# Patient Record
Sex: Female | Born: 1955 | Hispanic: Yes | Marital: Married | Smoking: Former smoker
Health system: Southern US, Community
[De-identification: ages and names within clinical notes are randomized; demographics above are authoritative.]

## PROBLEM LIST (undated history)

## (undated) DIAGNOSIS — N200 Calculus of kidney: Secondary | ICD-10-CM

## (undated) DIAGNOSIS — Z5189 Encounter for other specified aftercare: Secondary | ICD-10-CM

## (undated) DIAGNOSIS — Z8042 Family history of malignant neoplasm of prostate: Secondary | ICD-10-CM

## (undated) DIAGNOSIS — K649 Unspecified hemorrhoids: Secondary | ICD-10-CM

## (undated) DIAGNOSIS — Z9889 Other specified postprocedural states: Secondary | ICD-10-CM

## (undated) DIAGNOSIS — E785 Hyperlipidemia, unspecified: Secondary | ICD-10-CM

## (undated) DIAGNOSIS — G4700 Insomnia, unspecified: Secondary | ICD-10-CM

## (undated) DIAGNOSIS — K219 Gastro-esophageal reflux disease without esophagitis: Secondary | ICD-10-CM

## (undated) DIAGNOSIS — I10 Essential (primary) hypertension: Secondary | ICD-10-CM

## (undated) DIAGNOSIS — E559 Vitamin D deficiency, unspecified: Secondary | ICD-10-CM

## (undated) DIAGNOSIS — N63 Unspecified lump in unspecified breast: Secondary | ICD-10-CM

## (undated) DIAGNOSIS — G5600 Carpal tunnel syndrome, unspecified upper limb: Secondary | ICD-10-CM

## (undated) DIAGNOSIS — M810 Age-related osteoporosis without current pathological fracture: Secondary | ICD-10-CM

## (undated) DIAGNOSIS — C801 Malignant (primary) neoplasm, unspecified: Secondary | ICD-10-CM

## (undated) HISTORY — DX: Vitamin D deficiency, unspecified: E55.9

## (undated) HISTORY — DX: Family history of malignant neoplasm of prostate: Z80.42

## (undated) HISTORY — DX: Unspecified lump in unspecified breast: N63.0

## (undated) HISTORY — DX: Gastro-esophageal reflux disease without esophagitis: K21.9

## (undated) HISTORY — DX: Insomnia, unspecified: G47.00

## (undated) HISTORY — PX: BREAST EXCISIONAL BIOPSY: SUR124

## (undated) HISTORY — DX: Hyperlipidemia, unspecified: E78.5

## (undated) HISTORY — DX: Carpal tunnel syndrome, unspecified upper limb: G56.00

## (undated) HISTORY — DX: Age-related osteoporosis without current pathological fracture: M81.0

## (undated) HISTORY — DX: Essential (primary) hypertension: I10

## (undated) HISTORY — DX: Malignant (primary) neoplasm, unspecified: C80.1

## (undated) HISTORY — DX: Unspecified hemorrhoids: K64.9

## (undated) HISTORY — DX: Calculus of kidney: N20.0

## (undated) HISTORY — DX: Encounter for other specified aftercare: Z51.89

## (undated) HISTORY — PX: BIOPSY THYROID: PRO38

---

## 2008-04-22 HISTORY — PX: BREAST BIOPSY: SHX20

## 2013-04-22 HISTORY — PX: CHOLECYSTECTOMY: SHX55

## 2016-04-22 DIAGNOSIS — S62109A Fracture of unspecified carpal bone, unspecified wrist, initial encounter for closed fracture: Secondary | ICD-10-CM

## 2016-04-22 HISTORY — DX: Fracture of unspecified carpal bone, unspecified wrist, initial encounter for closed fracture: S62.109A

## 2017-04-22 HISTORY — PX: HEMORRHOIDECTOMY WITH HEMORRHOID BANDING: SHX5633

## 2018-05-18 DIAGNOSIS — I10 Essential (primary) hypertension: Secondary | ICD-10-CM | POA: Insufficient documentation

## 2018-06-10 LAB — HM MAMMOGRAPHY

## 2018-06-18 DIAGNOSIS — E559 Vitamin D deficiency, unspecified: Secondary | ICD-10-CM | POA: Insufficient documentation

## 2019-01-19 ENCOUNTER — Encounter: Payer: Self-pay | Admitting: Gastroenterology

## 2019-01-19 DIAGNOSIS — K5900 Constipation, unspecified: Secondary | ICD-10-CM | POA: Diagnosis not present

## 2019-01-19 DIAGNOSIS — E785 Hyperlipidemia, unspecified: Secondary | ICD-10-CM | POA: Diagnosis not present

## 2019-01-19 DIAGNOSIS — M545 Low back pain: Secondary | ICD-10-CM | POA: Diagnosis not present

## 2019-01-19 DIAGNOSIS — I10 Essential (primary) hypertension: Secondary | ICD-10-CM | POA: Diagnosis not present

## 2019-02-13 DIAGNOSIS — Z20828 Contact with and (suspected) exposure to other viral communicable diseases: Secondary | ICD-10-CM | POA: Diagnosis not present

## 2019-02-21 DIAGNOSIS — K449 Diaphragmatic hernia without obstruction or gangrene: Secondary | ICD-10-CM

## 2019-02-21 HISTORY — DX: Diaphragmatic hernia without obstruction or gangrene: K44.9

## 2019-02-22 ENCOUNTER — Ambulatory Visit (INDEPENDENT_AMBULATORY_CARE_PROVIDER_SITE_OTHER): Payer: BC Managed Care – PPO | Admitting: Gastroenterology

## 2019-02-22 ENCOUNTER — Other Ambulatory Visit: Payer: Self-pay

## 2019-02-22 ENCOUNTER — Encounter: Payer: Self-pay | Admitting: Gastroenterology

## 2019-02-22 VITALS — BP 122/84 | HR 66 | Temp 98.1°F | Ht 61.0 in | Wt 169.1 lb

## 2019-02-22 DIAGNOSIS — Z860100 Personal history of colon polyps, unspecified: Secondary | ICD-10-CM

## 2019-02-22 DIAGNOSIS — Z1159 Encounter for screening for other viral diseases: Secondary | ICD-10-CM

## 2019-02-22 DIAGNOSIS — K219 Gastro-esophageal reflux disease without esophagitis: Secondary | ICD-10-CM

## 2019-02-22 MED ORDER — NA SULFATE-K SULFATE-MG SULF 17.5-3.13-1.6 GM/177ML PO SOLN: 1.0000 | ORAL | 0 refills | Status: DC

## 2019-02-22 MED ORDER — OMEPRAZOLE 40 MG PO CPDR: 40.0000 mg | DELAYED_RELEASE_CAPSULE | Freq: Every day | ORAL | 11 refills | Status: DC

## 2019-02-22 NOTE — Patient Instructions (Signed)
You have been scheduled for an endoscopy and colonoscopy. Please follow the written instructions given to you at your visit today. Please pick up your prep supplies at the pharmacy within the next 1-3 days. If you use inhalers (even only as needed), please bring them with you on the day of your procedure.  We have sent the following medications to your pharmacy for you to pick up at your convenience:  Omeprazole 40 mg take daily 30 minutes before breakfast  Due to recent COVID-19 restrictions implemented by [employer] and state authorities and in an effort to keep both patients and staff as safe as possible, [facility] requires COVID-19 testing prior to any scheduled endoscopic procedure. The testing center is located at [address] [place], [place] [address] in the [facility]  suite.  Your appointment has been scheduled for 03-17-2019 at 9:30 am.   Please bring your insurance cards to this appointment. You will require your COVID screen 2 business days prior to your endoscopic procedure.  You are not required to quarantine after your screening.  You will only receive a phone call with the results if it is POSITIVE.  If you do not receive a call the day before your procedure you should begin your prep, if ordered, and you should report to the endo center for your procedure at your designated appointment arrival time ( one hour prior to the procedure time). There is no cost to you for the screening on the day of the swab.  [facility] will file with your insurance company for the testing.    You may receive an automated phone call prior to your procedure or have a message in your MyChart that you have an appointment for a BP/15 at the [facility], please disregard this message.  Your testing will be at the [place] location.   If you are leaving [facility] travel [place] on [address], turn left onto [facility], turn night onto [address] at the 1st stop light turn right, pass the [employer] on your right and proceed to [place] (white building).

## 2019-02-22 NOTE — Progress Notes (Signed)
HPI: This is a very pleasant Spanish-speaking only 63 year old woman who was referred by her primary care physician [facility] [place] for chronic GERD, history of polyps:  Chief complaint is history of polyps in her colon, chronic GERD  She brought with her her 96 colonoscopy report from [place].  The gastroenterologist documented removal of 2 quite good sized polyps.  One was a 20 mm pedunculated polyp in the transverse that was completely removed with snare cautery.  The other was a 30 mm semipedunculated polyp that was removed piecemeal from the transverse colon.  That second site was tattooed with [place] ink.  No pathology results were available.  She recalls being told that she needed a "colonoscopy every 3 years"  She also has chronic heartburn.  At least for 15 or 20 years.  This can be bad at night but also occurs throughout the day.  It is severe burning.  Severe acid taste in her mouth.  Tums barely helps.  She has tried omeprazole in the past and it helped but she was told that it was bad to be on this long-term.  She does not have dysphagia.  Her weight is increasing lately.  She has no overt GI bleeding  Colon cancer does not run in her family   Review of systems: Pertinent positive and negative review of systems were noted in the above HPI section. All other review negative.   Past Medical History:  Diagnosis Date  . Hemorrhoids   . HTN (hypertension)   . Vitamin D deficiency     Past Surgical History:  Procedure Laterality Date  . BREAST SURGERY  2010  . CHOLECYSTECTOMY  2015  . COLONOSCOPY  2016  . HEMORRHOIDECTOMY WITH HEMORRHOID BANDING  04/22/2017    Current Outpatient Medications  Medication Sig Dispense Refill  . magnesium 30 MG tablet Take 30 mg by mouth daily.    . Melatonin 3 MG TABS Take 3 mg by mouth as needed.    . metoprolol succinate (TOPROL-XL) 25 MG 24 hr tablet Take 25 mg by mouth daily.    . psyllium (METAMUCIL) 58.6 % powder Take 1 packet by  mouth daily.    . vitamin E 100 UNIT capsule Take by mouth daily.     No current facility-administered medications for this visit.     Allergies as of 02/22/2019  . (No Known Allergies)    Family History  Problem Relation Age of Onset  . Cancer Maternal Grandmother   . Cancer Maternal Aunt   . Hypertension Mother     Social History   Socioeconomic History  . Marital status: Married    Spouse name: Not on file  . Number of children: Not on file  . Years of education: Not on file  . Highest education level: Not on file  Occupational History  . Not on file  Social Needs  . Financial resource strain: Not on file  . Food insecurity    Worry: Not on file    Inability: Not on file  . Transportation needs    Medical: Not on file    Non-medical: Not on file  Tobacco Use  . Smoking status: Never Smoker  . Smokeless tobacco: Former Network engineer and Sexual Activity  . Alcohol use: Yes    Comment: occasional  . Drug use: Not Currently  . Sexual activity: Not Currently  Lifestyle  . Physical activity    Days per week: Not on file    Minutes per session:  Not on file  . Stress: Not on file  Relationships  . Social Herbalist on phone: Not on file    Gets together: Not on file    Attends religious service: Not on file    Active member of club or organization: Not on file    Attends meetings of clubs or organizations: Not on file    Relationship status: Not on file  . Intimate partner violence    Fear of current or ex partner: Not on file    Emotionally abused: Not on file    Physically abused: Not on file    Forced sexual activity: Not on file  Other Topics Concern  . Not on file  Social History Narrative  . Not on file     Physical Exam: BP 122/84 (BP Location: Left Arm, Patient Position: Sitting, Cuff Size: Normal)   Pulse 66   Temp 98.1 F (36.7 C) (Oral)   Ht 5\' 1"  (1.549 m)   Wt 169 lb 2 oz (76.7 kg)   BMI 31.96 kg/m  Constitutional:  generally well-appearing Psychiatric: alert and oriented x3 Eyes: extraocular movements intact Mouth: oral pharynx moist, no lesions Neck: supple no lymphadenopathy Cardiovascular: heart regular rate and rhythm Lungs: clear to auscultation bilaterally Abdomen: soft, nontender, nondistended, no obvious ascites, no peritoneal signs, normal bowel sounds Extremities: no lower extremity edema bilaterally Skin: no lesions on visible extremities   Assessment and plan: 63 y.o. female with personal history of colon polyps, chronic GERD  She had 2 good sized polyps removed from her transverse colon.  We will try to get records from the pathology report.  Either way she clearly needs repeat colonoscopy at this point as it has been almost 6 years since that colonoscopy.  She has chronic GERD without alarm symptoms.  She has never had endoscopic testing, screening for Barrett's and we will do that at the same time as her colonoscopy.  I am calling in omeprazole 40 mg pills 1 pill shortly before breakfast every morning for now.  Please see the "Patient Instructions" section for addition details about the plan.   Owens Loffler, MD [facility] 02/22/2019, 10:12 AM

## 2019-03-08 ENCOUNTER — Encounter: Payer: Self-pay | Admitting: Gastroenterology

## 2019-03-15 ENCOUNTER — Telehealth: Payer: Self-pay | Admitting: Gastroenterology

## 2019-03-15 NOTE — Telephone Encounter (Signed)
I reviewed a packet of information from [facility].  This was from September 2020 when she was being referred for colonoscopy care.  There is really no mention of previous colonoscopies, pathology reports anywhere in this packet.

## 2019-03-17 ENCOUNTER — Other Ambulatory Visit: Payer: Self-pay | Admitting: Gastroenterology

## 2019-03-17 ENCOUNTER — Ambulatory Visit: Payer: BC Managed Care – PPO

## 2019-03-17 DIAGNOSIS — Z1159 Encounter for screening for other viral diseases: Secondary | ICD-10-CM

## 2019-03-19 LAB — SARS CORONAVIRUS 2 (TAT 6-24 HRS): SARS Coronavirus 2: NEGATIVE

## 2019-03-22 ENCOUNTER — Other Ambulatory Visit: Payer: Self-pay

## 2019-03-22 ENCOUNTER — Encounter: Payer: Self-pay | Admitting: Gastroenterology

## 2019-03-22 ENCOUNTER — Ambulatory Visit: Payer: BC Managed Care – PPO | Admitting: Gastroenterology

## 2019-03-22 VITALS — BP 148/77 | HR 60 | Temp 98.1°F | Resp 11 | Ht 61.0 in | Wt 169.0 lb

## 2019-03-22 DIAGNOSIS — K219 Gastro-esophageal reflux disease without esophagitis: Secondary | ICD-10-CM

## 2019-03-22 DIAGNOSIS — Z1211 Encounter for screening for malignant neoplasm of colon: Secondary | ICD-10-CM | POA: Diagnosis not present

## 2019-03-22 DIAGNOSIS — Z860100 Personal history of colon polyps, unspecified: Secondary | ICD-10-CM

## 2019-03-22 DIAGNOSIS — D125 Benign neoplasm of sigmoid colon: Secondary | ICD-10-CM

## 2019-03-22 DIAGNOSIS — K449 Diaphragmatic hernia without obstruction or gangrene: Secondary | ICD-10-CM

## 2019-03-22 MED ORDER — SODIUM CHLORIDE 0.9 % IV SOLN: 500.0000 mL | Freq: Once | INTRAVENOUS | Status: DC

## 2019-03-22 NOTE — Progress Notes (Signed)
To PACU, VSS. Report to [email or web address] 

## 2019-03-22 NOTE — Progress Notes (Signed)
Called to room to assist during endoscopic procedure.  Patient ID and intended procedure confirmed with present staff. Received instructions for my participation in the procedure from the performing physician.  

## 2019-03-22 NOTE — Op Note (Signed)
[facility] Patient Name: Lauren Hodge Procedure Date: 03/22/2019 10:21 AM MRN: [id number] Endoscopist: Milus Banister , MD Age: 63 Referring MD:  Date of Birth: [birth year] Gender: Female Account #: [id number] Procedure:                Upper GI endoscopy Medicines:                Monitored Anesthesia Care Procedure:                Pre-Anesthesia Assessment:                           - Prior to the procedure, a History and Physical                            was performed, and patient medications and                            allergies were reviewed. The patient's tolerance of                            previous anesthesia was also reviewed. The risks                            and benefits of the procedure and the sedation                            options and risks were discussed with the patient.                            All questions were answered, and informed consent                            was obtained. Prior Anticoagulants: The patient has                            taken no previous anticoagulant or antiplatelet                            agents. ASA Grade Assessment: II - A patient with                            mild systemic disease. After reviewing the risks                            and benefits, the patient was deemed in                            satisfactory condition to undergo the procedure.                           After obtaining informed consent, the endoscope was                            passed under direct vision. Throughout the  procedure, the patient's blood pressure, pulse, and                            oxygen saturations were monitored continuously. The                            Endoscope was introduced through the mouth, and                            advanced to the second part of duodenum. The upper                            GI endoscopy was accomplished without difficulty.   The patient tolerated the procedure well. Scope In: Scope Out: Findings:                 A small hiatal hernia was present.                           The exam was otherwise without abnormality. Complications:            No immediate complications. Estimated blood loss:                            None. Estimated Blood Loss:     Estimated blood loss: none. Impression:               - Small hiatal hernia.                           - The examination was otherwise normal.                           - No specimens collected. Recommendation:           - Patient has a contact number available for                            emergencies. The signs and symptoms of potential                            delayed complications were discussed with the                            patient. Return to normal activities tomorrow.                            Written discharge instructions were provided to the                            patient.                           - Resume previous diet.                           - Continue present medications (take the omeprazole  40mg  pills started in the office before breakfast                            every morning). Milus Banister, MD 03/22/2019 10:51:07 AM This report has been signed electronically.

## 2019-03-22 NOTE — Op Note (Signed)
[facility] Patient Name: Lauren Hodge Procedure Date: 03/22/2019 10:21 AM MRN: [id number] Endoscopist: Milus Banister , MD Age: 63 Referring MD:  Date of Birth: 10-08-1955 Gender: Female Account #: [id number] Procedure:                Colonoscopy Indications:              High risk colon cancer surveillance: Personal                            history of colonic polyps; colonoscopy 2015 [place]                            [place] removed two polyps (80mm pedunculated in                            transverse, 85mm sessile in transverse removed                            piecemeal, site tattood; path requested, never                            receieved) Medicines:                Monitored Anesthesia Care Procedure:                Pre-Anesthesia Assessment:                           - Prior to the procedure, a History and Physical                            was performed, and patient medications and                            allergies were reviewed. The patient's tolerance of                            previous anesthesia was also reviewed. The risks                            and benefits of the procedure and the sedation                            options and risks were discussed with the patient.                            All questions were answered, and informed consent                            was obtained. Prior Anticoagulants: The patient has                            taken no previous anticoagulant or antiplatelet  agents. ASA Grade Assessment: II - A patient with                            mild systemic disease. After reviewing the risks                            and benefits, the patient was deemed in                            satisfactory condition to undergo the procedure.                           After obtaining informed consent, the colonoscope                            was passed under direct vision. Throughout the                 procedure, the patient's blood pressure, pulse, and                            oxygen saturations were monitored continuously. The                            Colonoscope was introduced through the anus and                            advanced to the the cecum, identified by                            appendiceal orifice and ileocecal valve. The                            colonoscopy was performed without difficulty. The                            patient tolerated the procedure well. The quality                            of the bowel preparation was good. The ileocecal                            valve, appendiceal orifice, and rectum were                            photographed. Scope In: 10:30:29 AM Scope Out: 10:41:20 AM Scope Withdrawal Time: 0 hours 8 minutes 19 seconds  Total Procedure Duration: 0 hours 10 minutes 51 seconds  Findings:                 The site of 2015 polypectomy [facility]) was clearly                            located with aid of previous submucosal tattoo                            (  appeared to be in the descending segment). There                            was no residual or recurrent polyp at the site.                           A 2 mm polyp was found in the sigmoid colon. The                            polyp was sessile. The polyp was removed with a                            cold snare. Resection and retrieval were complete.                           The exam was otherwise without abnormality on                            direct and retroflexion views. Complications:            No immediate complications. Estimated blood loss:                            None. Estimated Blood Loss:     Estimated blood loss: none. Impression:               - One 2 mm polyp in the sigmoid colon, removed with                            a cold snare. Resected and retrieved.                           - The site of 2015 polypectomy [facility]) was clearly                             located with aid of previous submucosal tattoo                            (appeared to be in the descending segment). There                            was no residual or recurrent polyp at the site.                           - The examination was otherwise normal on direct                            and retroflexion views. Recommendation:           - Patient has a contact number available for                            emergencies. The signs and symptoms of potential  delayed complications were discussed with the                            patient. Return to normal activities tomorrow.                            Written discharge instructions were provided to the                            patient.                           - Resume previous diet.                           - Continue present medications.                           - Await pathology results. Milus Banister, MD 03/22/2019 10:49:10 AM This report has been signed electronically.

## 2019-03-22 NOTE — Patient Instructions (Addendum)
USTED TUVO UN PROCEDIMIENTO ENDOSCPICO HOY EN EL [facility]:   Lea el informe del procedimiento que se le entreg para cualquier pregunta especfica sobre lo que se encontr durante su examen.  Si el informe del examen no responde a sus preguntas, por favor llame a su gastroenterlogo para aclararlo.  Si usted solicit que no se le den detalles de lo que se encontr en su procedimiento al acompaante que le va a cuidar, entonces el informe del procedimiento se ha incluido en un sobre sellado para que usted lo revise despus cuando le sea ms conveniente.   LO QUE PUEDE ESPERAR: Algunas sensaciones de hinchazn en el abdomen.  Puede tener ms gases de lo normal.  El caminar puede ayudarle a eliminar el aire que se le puso en el tracto gastrointestinal durante el procedimiento y reducir la hinchazn.  Si le hicieron una endoscopia inferior (como una colonoscopia o una sigmoidoscopia flexible), podra notar manchas de sangre en las heces fecales o en el papel higinico.  Si se someti a una preparacin intestinal para su procedimiento, es posible que no tenga una evacuacin intestinal normal durante algunos das.   Tenga en cuenta:  Es posible que note un poco de irritacin y congestin en la nariz o algn drenaje.  Esto es debido al oxgeno utilizado durante su procedimiento.  No hay que preocuparse y esto debe desaparecer ms o menos en un da.   SNTOMAS PARA REPORTAR INMEDIATAMENTE:  Despus de una endoscopia inferior (colonoscopia o sigmoidoscopia flexible):  Cantidades excesivas de sangre en las heces fecales  Sensibilidad significativa o empeoramiento de los dolores abdominales   Hinchazn aguda del abdomen que antes no tena   Fiebre de 100F o ms   Despus de la endoscopia superior (EGD)  Vmitos de sangre o material como caf molido   Dolor en el pecho o dolor debajo de los omplatos que antes no tena   Dolor o dificultad persistente para tragar  Falta de aire que antes no  tena   Fiebre de 100F o ms  Heces fecales negras y pegajosas   Para asuntos urgentes o de emergencia, puede comunicarse con un gastroenterlogo a cualquier hora llamando al [phone].  DIETA:  Recomendamos una comida pequea al principio, pero luego puede continuar con su dieta normal.  Tome muchos lquidos, pero debe evitar las bebidas alcohlicas durante 24 horas.    ACTIVIDAD:  Debe planear tomarse las cosas con calma por el resto del da y no debe CONDUCIR ni usar maquinaria pesada hasta maana (debido a los medicamentos de sedacin utilizados durante el examen).     SEGUIMIENTO: Nuestro personal llamar al nmero que aparece en su historial al siguiente da hbil de su procedimiento para ver cmo se siente y para responder cualquier pregunta o inquietud que pueda tener con respecto a la informacin que se le dio despus del procedimiento. Si no podemos contactarle, le dejaremos un mensaje.  Sin embargo, si se siente bien y no tiene ningn problema, no es necesario que nos devuelva la llamada.  Asumiremos que ha regresado a sus actividades diarias normales sin incidentes. Si se le tomaron algunas biopsias, le contactaremos por telfono o por carta en las prximas 3 semanas.  Si no ha sabido nada sobre las biopsias en el transcurso de 3 semanas, por favor llmenos al [phone].   FIRMAS/CONFIDENCIALIDAD: Usted y/o el acompaante que le cuide han firmado documentos que se ingresarn en su historial mdico electrnico.  Estas firmas atestiguan el hecho   de que la informacin anterior  Read all handouts gien to you by your reocvery room nurse.  Thank-you for choosing [place] for your healthcare needs today.  YOU HAD AN ENDOSCOPIC PROCEDURE TODAY AT [facility]:   Refer to the procedure report that was given to you for any specific questions about what was found during the examination.  If the procedure report does not answer your questions, please call your  gastroenterologist to clarify.  If you requested that your care partner not be given the details of your procedure findings, then the procedure report has been included in a sealed envelope for you to review at your convenience later.  YOU SHOULD EXPECT: Some feelings of bloating in the abdomen. Passage of more gas than usual.  Walking can help get rid of the air that was put into your GI tract during the procedure and reduce the bloating. If you had a lower endoscopy (such as a colonoscopy or flexible sigmoidoscopy) you may notice spotting of blood in your stool or on the toilet paper. If you underwent a bowel prep for your procedure, you may not have a normal bowel movement for a few days.  Please Note:  You might notice some irritation and congestion in your nose or some drainage.  This is from the oxygen used during your procedure.  There is no need for concern and it should clear up in a day or so.  SYMPTOMS TO REPORT IMMEDIATELY:   Following lower endoscopy (colonoscopy or flexible sigmoidoscopy):  Excessive amounts of blood in the stool  Significant tenderness or worsening of abdominal pains  Swelling of the abdomen that is new, acute  Fever of 100F or higher   Following upper endoscopy (EGD)  Vomiting of blood or coffee ground material  New chest pain or pain under the shoulder blades  Painful or persistently difficult swallowing  New shortness of breath  Fever of 100F or higher  Black, tarry-looking stools  For urgent or emergent issues, a gastroenterologist can be reached at any hour by calling [phone].   DIET:  We do recommend a small meal at first, but then you may proceed to your regular diet.  Drink plenty of fluids but you should avoid alcoholic beverages for 24 hours.  ACTIVITY:  You should plan to take it easy for the rest of today and you should NOT DRIVE or use heavy machinery until tomorrow (because of the sedation medicines used during the test).    FOLLOW  UP: Our staff will call the number listed on your records 48-72 hours following your procedure to check on you and address any questions or concerns that you may have regarding the information given to you following your procedure. If we do not reach you, we will leave a message.  We will attempt to reach you two times.  During this call, we will ask if you have developed any symptoms of COVID 19. If you develop any symptoms (ie: fever, flu-like symptoms, shortness of breath, cough etc.) before then, please call [phone].  If you test positive for Covid 19 in the 2 weeks post procedure, please call and report this information to [place].    If any biopsies were taken you will be contacted by phone or by letter within the next 1-3 weeks.  Please call us at [phone] if you have not heard about the biopsies in 3 weeks.    SIGNATURES/CONFIDENTIALITY: You and/or your care partner have signed paperwork which will  be entered into your electronic medical record.  These signatures attest to the fact that that the information above on your After Visit Summary has been reviewed and is understood.  Full responsibility of the confidentiality of this discharge information lies with you and/or your care-partner. 

## 2019-03-24 ENCOUNTER — Telehealth: Payer: Self-pay

## 2019-03-24 NOTE — Telephone Encounter (Signed)
  Follow up Call-  Call back number 03/22/2019  Post procedure Call Back phone  # [phone]  Permission to leave phone message Yes  Some recent data might be hidden     Patient questions:  Do you have a fever, pain , or abdominal swelling? No. Pain Score  0 *  Have you tolerated food without any problems? Yes.    Have you been able to return to your normal activities? Yes.    Do you have any questions about your discharge instructions: Diet   No. Medications  No. Follow up visit  No.  Do you have questions or concerns about your Care? No.  Actions: * If pain score is 4 or above: No action needed, pain <4.  1. Have you developed a fever since your procedure? No  2.   Have you had an respiratory symptoms (SOB or cough) since your procedure? No 3.   Have you tested positive for COVID 19 since your procedure No  4.   Have you had any family members/close contacts diagnosed with the COVID 19 since your procedure?  No  If yes to any of these questions please route to Joylene John, RN and Alphonsa Gin, RN.

## 2019-03-26 ENCOUNTER — Encounter: Payer: Self-pay | Admitting: Gastroenterology

## 2019-06-29 DIAGNOSIS — K219 Gastro-esophageal reflux disease without esophagitis: Secondary | ICD-10-CM | POA: Diagnosis not present

## 2019-06-29 DIAGNOSIS — E559 Vitamin D deficiency, unspecified: Secondary | ICD-10-CM | POA: Diagnosis not present

## 2019-06-29 DIAGNOSIS — I10 Essential (primary) hypertension: Secondary | ICD-10-CM | POA: Diagnosis not present

## 2019-06-29 LAB — HEPATIC FUNCTION PANEL
ALT: 23 (ref 7–35)
AST: 20 (ref 13–35)
Alkaline Phosphatase: 123 (ref 25–125)
Bilirubin, Total: 0.4

## 2019-06-29 LAB — COMPREHENSIVE METABOLIC PANEL
Albumin: 4.3 (ref 3.5–5.0)
Calcium: 9.2 (ref 8.7–10.7)
GFR calc Af Amer: 110
GFR calc non Af Amer: 96
Globulin: 3.1

## 2019-06-29 LAB — BASIC METABOLIC PANEL
BUN: 14 (ref 4–21)
CO2: 20 (ref 13–22)
Chloride: 103 (ref 99–108)
Creatinine: 0.7 (ref 0.5–1.1)
Glucose: 88
Potassium: 3.9 (ref 3.4–5.3)
Sodium: 143 (ref 137–147)

## 2019-06-29 LAB — LIPID PANEL
Cholesterol: 226 — AB (ref 0–200)
HDL: 61 (ref 35–70)
LDL Cholesterol: 143
Triglycerides: 126 (ref 40–160)

## 2019-06-29 LAB — MICROALBUMIN, URINE: Microalb, Ur: 14.2

## 2019-06-29 LAB — VITAMIN D 25 HYDROXY (VIT D DEFICIENCY, FRACTURES): Vit D, 25-Hydroxy: 21.8

## 2019-07-28 DIAGNOSIS — Z20828 Contact with and (suspected) exposure to other viral communicable diseases: Secondary | ICD-10-CM | POA: Diagnosis not present

## 2019-07-29 DIAGNOSIS — Z23 Encounter for immunization: Secondary | ICD-10-CM | POA: Diagnosis not present

## 2019-08-19 DIAGNOSIS — Z23 Encounter for immunization: Secondary | ICD-10-CM | POA: Diagnosis not present

## 2019-09-30 DIAGNOSIS — E785 Hyperlipidemia, unspecified: Secondary | ICD-10-CM | POA: Diagnosis not present

## 2019-09-30 DIAGNOSIS — R2231 Localized swelling, mass and lump, right upper limb: Secondary | ICD-10-CM | POA: Diagnosis not present

## 2019-09-30 DIAGNOSIS — E559 Vitamin D deficiency, unspecified: Secondary | ICD-10-CM | POA: Diagnosis not present

## 2019-09-30 DIAGNOSIS — I10 Essential (primary) hypertension: Secondary | ICD-10-CM | POA: Diagnosis not present

## 2019-09-30 LAB — LIPID PANEL
Cholesterol: 224 — AB (ref 0–200)
HDL: 53 (ref 35–70)
LDL Cholesterol: 148
Triglycerides: 127 (ref 40–160)

## 2019-09-30 LAB — COMPREHENSIVE METABOLIC PANEL
Albumin: 4.2 (ref 3.5–5.0)
Calcium: 9.1 (ref 8.7–10.7)
GFR calc Af Amer: 118
GFR calc non Af Amer: 102
Globulin: 3.3

## 2019-09-30 LAB — HEPATIC FUNCTION PANEL
ALT: 24 (ref 7–35)
AST: 20 (ref 13–35)
Alkaline Phosphatase: 127 — AB (ref 25–125)
Bilirubin, Total: 0.4

## 2019-09-30 LAB — BASIC METABOLIC PANEL
BUN: 11 (ref 4–21)
CO2: 24 — AB (ref 13–22)
Chloride: 103 (ref 99–108)
Creatinine: 0.5 (ref 0.5–1.1)
Glucose: 88
Potassium: 3.7 (ref 3.4–5.3)
Sodium: 143 (ref 137–147)

## 2019-09-30 LAB — VITAMIN D 25 HYDROXY (VIT D DEFICIENCY, FRACTURES): Vit D, 25-Hydroxy: 25.7

## 2019-10-06 DIAGNOSIS — E785 Hyperlipidemia, unspecified: Secondary | ICD-10-CM | POA: Insufficient documentation

## 2020-01-25 DIAGNOSIS — L811 Chloasma: Secondary | ICD-10-CM | POA: Diagnosis not present

## 2020-01-25 DIAGNOSIS — E785 Hyperlipidemia, unspecified: Secondary | ICD-10-CM | POA: Diagnosis not present

## 2020-01-25 DIAGNOSIS — G5603 Carpal tunnel syndrome, bilateral upper limbs: Secondary | ICD-10-CM | POA: Diagnosis not present

## 2020-01-25 DIAGNOSIS — I10 Essential (primary) hypertension: Secondary | ICD-10-CM | POA: Diagnosis not present

## 2020-01-25 LAB — COMPREHENSIVE METABOLIC PANEL
Albumin: 4.4 (ref 3.5–5.0)
Calcium: 9.4 (ref 8.7–10.7)
GFR calc Af Amer: 114
GFR calc non Af Amer: 99
Globulin: 3.4

## 2020-01-25 LAB — BASIC METABOLIC PANEL
BUN: 16 (ref 4–21)
CO2: 25 — AB (ref 13–22)
Chloride: 102 (ref 99–108)
Creatinine: 0.6 (ref 0.5–1.1)
Glucose: 102
Potassium: 4.7 (ref 3.4–5.3)
Sodium: 141 (ref 137–147)

## 2020-01-25 LAB — HEPATIC FUNCTION PANEL
ALT: 48 — AB (ref 7–35)
AST: 34 (ref 13–35)
Alkaline Phosphatase: 160 — AB (ref 25–125)
Bilirubin, Total: 0.4

## 2020-01-25 LAB — LIPID PANEL
Cholesterol: 217 — AB (ref 0–200)
HDL: 60 (ref 35–70)
LDL Cholesterol: 138
Triglycerides: 109 (ref 40–160)

## 2020-05-15 DIAGNOSIS — R509 Fever, unspecified: Secondary | ICD-10-CM | POA: Diagnosis not present

## 2020-05-15 DIAGNOSIS — Z760 Encounter for issue of repeat prescription: Secondary | ICD-10-CM | POA: Diagnosis not present

## 2020-05-15 DIAGNOSIS — Z03818 Encounter for observation for suspected exposure to other biological agents ruled out: Secondary | ICD-10-CM | POA: Diagnosis not present

## 2020-07-21 DIAGNOSIS — E119 Type 2 diabetes mellitus without complications: Secondary | ICD-10-CM | POA: Diagnosis not present

## 2020-07-26 ENCOUNTER — Ambulatory Visit: Payer: BC Managed Care – PPO | Admitting: Medical

## 2020-07-26 DIAGNOSIS — K219 Gastro-esophageal reflux disease without esophagitis: Secondary | ICD-10-CM | POA: Insufficient documentation

## 2020-07-27 DIAGNOSIS — L811 Chloasma: Secondary | ICD-10-CM | POA: Diagnosis not present

## 2020-07-27 DIAGNOSIS — L821 Other seborrheic keratosis: Secondary | ICD-10-CM | POA: Diagnosis not present

## 2020-07-27 DIAGNOSIS — D1801 Hemangioma of skin and subcutaneous tissue: Secondary | ICD-10-CM | POA: Diagnosis not present

## 2020-07-30 DIAGNOSIS — Z20822 Contact with and (suspected) exposure to covid-19: Secondary | ICD-10-CM | POA: Diagnosis not present

## 2020-08-17 ENCOUNTER — Encounter: Payer: Self-pay | Admitting: Physician Assistant

## 2020-08-20 DIAGNOSIS — E119 Type 2 diabetes mellitus without complications: Secondary | ICD-10-CM | POA: Diagnosis not present

## 2020-08-24 ENCOUNTER — Encounter: Payer: Self-pay | Admitting: Internal Medicine

## 2020-08-24 ENCOUNTER — Ambulatory Visit (INDEPENDENT_AMBULATORY_CARE_PROVIDER_SITE_OTHER): Payer: BC Managed Care – PPO | Admitting: Internal Medicine

## 2020-08-24 ENCOUNTER — Other Ambulatory Visit (HOSPITAL_BASED_OUTPATIENT_CLINIC_OR_DEPARTMENT_OTHER): Payer: Self-pay

## 2020-08-24 ENCOUNTER — Other Ambulatory Visit: Payer: Self-pay

## 2020-08-24 VITALS — BP 162/89 | HR 64 | Temp 98.1°F | Ht 61.0 in | Wt 164.4 lb

## 2020-08-24 DIAGNOSIS — Z1272 Encounter for screening for malignant neoplasm of vagina: Secondary | ICD-10-CM

## 2020-08-24 DIAGNOSIS — E785 Hyperlipidemia, unspecified: Secondary | ICD-10-CM | POA: Diagnosis not present

## 2020-08-24 DIAGNOSIS — E041 Nontoxic single thyroid nodule: Secondary | ICD-10-CM

## 2020-08-24 DIAGNOSIS — Z9889 Other specified postprocedural states: Secondary | ICD-10-CM | POA: Diagnosis not present

## 2020-08-24 DIAGNOSIS — I10 Essential (primary) hypertension: Secondary | ICD-10-CM

## 2020-08-24 DIAGNOSIS — E559 Vitamin D deficiency, unspecified: Secondary | ICD-10-CM

## 2020-08-24 DIAGNOSIS — N6459 Other signs and symptoms in breast: Secondary | ICD-10-CM

## 2020-08-24 DIAGNOSIS — Z09 Encounter for follow-up examination after completed treatment for conditions other than malignant neoplasm: Secondary | ICD-10-CM

## 2020-08-24 DIAGNOSIS — Z01419 Encounter for gynecological examination (general) (routine) without abnormal findings: Secondary | ICD-10-CM

## 2020-08-24 DIAGNOSIS — Z8049 Family history of malignant neoplasm of other genital organs: Secondary | ICD-10-CM

## 2020-08-24 LAB — LIPID PANEL
Cholesterol: 207 mg/dL — ABNORMAL HIGH (ref 0–200)
HDL: 55.9 mg/dL (ref >39.00)
LDL Cholesterol: 118 mg/dL — ABNORMAL HIGH (ref 0–99)
NonHDL: 151.07
Total CHOL/HDL Ratio: 4
Triglycerides: 163 mg/dL — ABNORMAL HIGH (ref 0.0–149.0)
VLDL: 32.6 mg/dL (ref 0.0–40.0)

## 2020-08-24 LAB — COMPREHENSIVE METABOLIC PANEL
ALT: 42 U/L — ABNORMAL HIGH (ref 0–35)
AST: 27 U/L (ref 0–37)
Albumin: 4.3 g/dL (ref 3.5–5.2)
Alkaline Phosphatase: 120 U/L — ABNORMAL HIGH (ref 39–117)
BUN: 14 mg/dL (ref 6–23)
CO2: 30 mEq/L (ref 19–32)
Calcium: 9.3 mg/dL (ref 8.4–10.5)
Chloride: 104 mEq/L (ref 96–112)
Creatinine, Ser: 0.56 mg/dL (ref 0.40–1.20)
GFR: 97.49 mL/min (ref >60.00)
Glucose, Bld: 101 mg/dL — ABNORMAL HIGH (ref 70–99)
Potassium: 4.4 mEq/L (ref 3.5–5.1)
Sodium: 141 mEq/L (ref 135–145)
Total Bilirubin: 0.6 mg/dL (ref 0.2–1.2)
Total Protein: 7.9 g/dL (ref 6.0–8.3)

## 2020-08-24 LAB — CBC WITH DIFFERENTIAL/PLATELET
Basophils Absolute: 0 10*3/uL (ref 0.0–0.1)
Basophils Relative: 0.5 % (ref 0.0–3.0)
Eosinophils Absolute: 0.2 10*3/uL (ref 0.0–0.7)
Eosinophils Relative: 3.1 % (ref 0.0–5.0)
HCT: 39.2 % (ref 36.0–46.0)
Hemoglobin: 13.1 g/dL (ref 12.0–15.0)
Lymphocytes Relative: 29.2 % (ref 12.0–46.0)
Lymphs Abs: 1.8 10*3/uL (ref 0.7–4.0)
MCHC: 33.3 g/dL (ref 30.0–36.0)
MCV: 88.5 fl (ref 78.0–100.0)
Monocytes Absolute: 0.4 10*3/uL (ref 0.1–1.0)
Monocytes Relative: 7 % (ref 3.0–12.0)
Neutro Abs: 3.7 10*3/uL (ref 1.4–7.7)
Neutrophils Relative %: 60.2 % (ref 43.0–77.0)
Platelets: 226 10*3/uL (ref 150.0–400.0)
RBC: 4.43 Mil/uL (ref 3.87–5.11)
RDW: 13.8 % (ref 11.5–15.5)
WBC: 6.2 10*3/uL (ref 4.0–10.5)

## 2020-08-24 LAB — TSH: TSH: 1.35 u[IU]/mL (ref 0.35–4.50)

## 2020-08-24 LAB — VITAMIN D 25 HYDROXY (VIT D DEFICIENCY, FRACTURES): VITD: 29.82 ng/mL — ABNORMAL LOW (ref 30.00–100.00)

## 2020-08-24 MED ORDER — AMLODIPINE BESYLATE 5 MG PO TABS
5.0000 mg | ORAL_TABLET | Freq: Every day | ORAL | 3 refills | Status: DC
  Filled 2020-08-24: qty 30, 30d supply, fill #0

## 2020-08-24 NOTE — Patient Instructions (Signed)
siga las mismas medicinas  ademas amlodipina  5 mg una al dia para la presion  chequee la presion (110/65- 135/85)  Thyroid ultrasound: vaya al primer piso  mamograma y [place]: la llamaran  regrese en 2 meses  saque una cita en dos meses   Check the  blood pressure   BP GOAL is between 110/65 and  135/85. If it is consistently higher or lower, let me know   GO TO THE LAB : Get the blood work     [place], Viola Come back for physical exam in 2 months

## 2020-08-24 NOTE — Progress Notes (Signed)
Subjective:    Patient ID: Lauren Hodge, female    DOB: [birth year], 65 y.o.   MRN: [id number]  DOS:  08/24/2020 Type of visit - description: Here to get established  History of hypertension, currently on metoprolol, at home BPs remain elevated History of high cholesterol: Currently diet controlled History of 2 previous breast biopsies, the patient is quite concerned about it and request a mammogram with diagnostic ultrasound Today I found R thyroid nodule, patient was unaware. History of vitamin D deficiency, on OTCs.  Brought almost 100 pieces of records for my review.  The most relevant ones will be scanned 06/12/2018: Vitamin D 12.8, low Vitamin S01 and folic acid --- normal  01/25/2020: Total cholesterol 217, LDL 138 Creatinine 0.5, potassium 4.7, AST 34, ALT 48 is slightly high  09/30/2019: Total cholesterol 224, triglyceride 127, LDL 148 Alkaline phosphatase 127 slightly elevated.  AST ALT normal  Vitamin D 25, low  Review of Systems Has no major or acute concerns today.  Past Medical History:  Diagnosis Date  . Carpal tunnel syndrome   . Fracture of carpal bone 2018   bilateral  . GERD (gastroesophageal reflux disease)   . Hemorrhoids   . Hiatal hernia 02/2019   seen on EGD   . HTN (hypertension)   . Hyperlipidemia   . Insomnia   . Osteoporosis   . Vitamin D deficiency     Past Surgical History:  Procedure Laterality Date  . BREAST BIOPSY  2010   x2, 2010-2012 (no cancer)  . CHOLECYSTECTOMY  2015  . HEMORRHOIDECTOMY WITH HEMORRHOID BANDING  04/22/2017   Social History   Socioeconomic History  . Marital status: Married    Spouse name: Not on file  . Number of children: 2  . Years of education: Not on file  . Highest education level: Not on file  Occupational History  . Occupation: retired   Tobacco Use  . Smoking status: Former Smoker    Types: Cigarettes    Quit date: 08/25/2018    Years since quitting: 2.0  . Smokeless tobacco: Never Used   . Tobacco comment: used to smoke rarely   Substance and Sexual Activity  . Alcohol use: Not Currently    Comment: occasional  . Drug use: Not Currently  . Sexual activity: Not Currently  Other Topics Concern  . Not on file  Social History Narrative   Moved from [place] 2019   Social Determinants of Health   Financial Resource Strain: Not on file  Food Insecurity: Not on file  Transportation Needs: Not on file  Physical Activity: Not on file  Stress: Not on file  Social Connections: Not on file  Intimate Partner Violence: Not on file   Family History  Problem Relation Age of Onset  . Cancer Maternal Grandmother        vaginal  . Cancer Maternal Aunt        tongue  . Hypertension Mother   . CAD Neg Hx   . Diabetes Neg Hx   . Colon cancer Neg Hx   . Breast cancer Neg Hx     Allergies as of 08/24/2020   No Known Allergies     Medication List       Accurate as of Aug 24, 2020 11:59 PM. If you have any questions, ask your nurse or doctor.        STOP taking these medications   magnesium 30 MG tablet Stopped by: Kathlene November, MD   omeprazole  40 MG capsule Commonly known as: PRILOSEC Stopped by: Kathlene November, MD   psyllium 58.6 % powder Commonly known as: METAMUCIL Stopped by: Kathlene November, MD   vitamin E 45 MG (100 UNITS) capsule Stopped by: Kathlene November, MD     TAKE these medications   amLODipine 5 MG tablet Commonly known as: NORVASC Take 1 tablet (5 mg total) by mouth daily. Started by: Kathlene November, MD   melatonin 3 MG Tabs tablet Take 3 mg by mouth as needed.   metoprolol succinate 25 MG 24 hr tablet Commonly known as: TOPROL-XL Take 25 mg by mouth daily.   multivitamin-iron-minerals-folic acid chewable tablet Chew 1 tablet by mouth daily.   VITAMIN D3 PO Take by mouth.          Objective:   Physical Exam BP (!) 162/89 (BP Location: Left Arm, Patient Position: Sitting, Cuff Size: Large)   Pulse 64   Temp 98.1 F (36.7 C) (Temporal)   Ht 5\' 1"  (1.549 m)    Wt 164 lb 6.4 oz (74.6 kg)   SpO2 95%   BMI 31.06 kg/m  General:   Well developed, NAD, BMI noted.  HEENT:  Normocephalic . Face symmetric, atraumatic Neck: Right side of the thyroid gland is enlarged, not tender. Lungs:  CTA B Normal respiratory effort, no intercostal retractions, no accessory muscle use. Heart: RRR,  no murmur.  Abdomen:  Not distended, soft, non-tender. No rebound or rigidity.   Skin: Not pale. Not jaundice Lower extremities: no pretibial edema bilaterally  Neurologic:  alert & oriented X3.  Speech normal, gait appropriate for age and unassisted Psych--  Cognition and judgment appear intact.  Cooperative with normal attention span and concentration.  Behavior appropriate. No anxious or depressed appearing.     Assessment      Assessment, new patient 08/2020 HTN Hyperlipidemia GERD Vitamin D deficiency Osteoporosis? Colon polyps CTS  PLAN New patient HTN: Dx ~  4 years ago, on metoprolol, heart rate 64, BP here is elevated, at home is often 160s.  Plan: CMP, CBC, TSH, add amlodipine 5.  Reassess in 2 months Hyperlipidemia: Diet controlled, check FLP Thyroid nodule, R: Patient unaware, check ultrasound Vitamin D deficiency  On OTC supplements, check vitamin D Preventive care: History of 2 breast bx, request mammogram with ultrasound, will do. Requesting a gyn  referral, will do RTC CPX 2 months  This visit occurred during the SARS-CoV-2 public health emergency.  Safety protocols were in place, including screening questions prior to the visit, additional usage of staff PPE, and extensive cleaning of exam room while observing appropriate contact time as indicated for disinfecting solutions.

## 2020-08-26 DIAGNOSIS — Z09 Encounter for follow-up examination after completed treatment for conditions other than malignant neoplasm: Secondary | ICD-10-CM | POA: Insufficient documentation

## 2020-08-26 NOTE — Assessment & Plan Note (Signed)
New patient HTN: Dx ~  4 years ago, on metoprolol, heart rate 64, BP here is elevated, at home is often 160s.  Plan: CMP, CBC, TSH, add amlodipine 5.  Reassess in 2 months Hyperlipidemia: Diet controlled, check FLP Thyroid nodule, R: Patient unaware, check ultrasound Vitamin D deficiency  On OTC supplements, check vitamin D Preventive care: History of 2 breast bx, request mammogram with ultrasound, will do. Requesting a gyn  referral, will do RTC CPX 2 months

## 2020-08-29 ENCOUNTER — Telehealth: Payer: Self-pay | Admitting: Internal Medicine

## 2020-08-29 ENCOUNTER — Other Ambulatory Visit: Payer: Self-pay

## 2020-08-29 ENCOUNTER — Other Ambulatory Visit (HOSPITAL_BASED_OUTPATIENT_CLINIC_OR_DEPARTMENT_OTHER): Payer: Self-pay

## 2020-08-29 DIAGNOSIS — R7989 Other specified abnormal findings of blood chemistry: Secondary | ICD-10-CM

## 2020-08-29 MED ORDER — VITAMIN D (ERGOCALCIFEROL) 1.25 MG (50000 UNIT) PO CAPS
50000.0000 [IU] | ORAL_CAPSULE | ORAL | 0 refills | Status: DC
  Filled 2020-08-29: qty 12, 84d supply, fill #0

## 2020-08-29 NOTE — Telephone Encounter (Signed)
   Lauren Hodge DOB: Sep 03, 1955 MRN: [id number]   RIDER WAIVER AND RELEASE OF LIABILITY  For purposes of improving physical access to our facilities, Lauren Hodge is pleased to partner with third parties to provide [facility] patients or other authorized individuals the option of convenient, on-demand ground transportation services (the [place]") through use of the technology service that enables users to request on-demand ground transportation from independent third-party providers.  By opting to use and accept these [employer], I, the undersigned, hereby agree on behalf of myself, and on behalf of any minor child using the [employer] for whom I am the parent or legal guardian, as follows:  1. Government social research officer provided to me are provided by independent third-party transportation providers who are not [employer] or employees and who are unaffiliated with [employer]. 2. [employer] is neither a transportation carrier nor a common or public carrier. 3. [facility] has no control over the quality or safety of the transportation that occurs as a result of the [employer]. 4. [facility] cannot guarantee that any third-party transportation provider will complete any arranged transportation service. 5. [facility] makes no representation, warranty, or guarantee regarding the reliability, timeliness, quality, safety, suitability, or availability of any of the Transport Services or that they will be error free. 6. I fully understand that traveling by vehicle involves risks and dangers of serious bodily injury, including permanent disability, paralysis, and death. I agree, on behalf of myself and on behalf of any minor child using the Transport Services for whom I am the parent or legal guardian, that the entire risk arising out of my use of the [employer] remains solely with me, to the maximum extent permitted under applicable law. 7. The [employer] are provided "as is" and "as available." [facility] disclaims all representations and warranties, express, implied or statutory, not expressly set out in these terms, including the implied warranties of merchantability and fitness for a particular purpose. 8. I hereby waive and release [facility], its agents, employees, officers, directors, representatives, insurers, attorneys, assigns, successors, subsidiaries, and affiliates from any and all past, present, or future claims, demands, liabilities, actions, causes of action, or suits of any kind directly or indirectly arising from acceptance and use of the [employer]. 9. I further waive and release [facility] and its affiliates from all present and future liability and responsibility for any injury or death to persons or damages to property caused by or related to the use of the [employer]. 10. I have read this Waiver and Release of Liability, and I understand the terms used in it and their legal significance. This Waiver is freely and voluntarily given with the understanding that my right (as well as the right of any minor child for whom I am the parent or legal guardian using the [employer]) to legal recourse against  in connection with the [employer] is knowingly surrendered in return for use of these services.   I attest that I read the consent document to [name], gave Ms. Khurana the opportunity to ask questions and answered the questions asked (if any). I affirm that [name] then provided consent for she's participation in this program.     Legrand Pitts, read to patient in [place]

## 2020-08-30 ENCOUNTER — Other Ambulatory Visit: Payer: Self-pay | Admitting: Family Medicine

## 2020-08-30 ENCOUNTER — Ambulatory Visit (HOSPITAL_BASED_OUTPATIENT_CLINIC_OR_DEPARTMENT_OTHER)
Admission: RE | Admit: 2020-08-30 | Discharge: 2020-08-30 | Disposition: A | Discharge status: Home / Self Care | Payer: BC Managed Care – PPO | Source: Ambulatory Visit | Attending: Internal Medicine | Admitting: Internal Medicine

## 2020-08-30 ENCOUNTER — Other Ambulatory Visit: Payer: Self-pay

## 2020-08-30 ENCOUNTER — Encounter: Payer: Self-pay | Admitting: Family Medicine

## 2020-08-30 DIAGNOSIS — E041 Nontoxic single thyroid nodule: Secondary | ICD-10-CM | POA: Insufficient documentation

## 2020-08-30 NOTE — Progress Notes (Signed)
Received her thyroid US as below- message to pt  US THYROID  Result Date: 08/30/2020 CLINICAL DATA:  Right thyroid nodule EXAM: THYROID ULTRASOUND TECHNIQUE: Ultrasound examination of the thyroid gland and adjacent soft tissues was performed. COMPARISON:  None. FINDINGS: Parenchymal Echotexture: Normal Isthmus: 0.5 cm Right lobe: 5.0 x 2.0 x 2.2 cm Left lobe: 5.5 x 1.3 x 1.9 cm _________________________________________________________ Estimated total number of nodules >/= 1 cm: 2 Number of spongiform nodules >/=  2 cm not described below (TR1): 0 Number of mixed cystic and solid nodules >/= 1.5 cm not described below (TR2): 0 _________________________________________________________ Nodule # 1: Location: Right; inferior Maximum size: 1.3 cm; Other 2 dimensions: 1.2 x 1.2 cm Composition: solid/almost completely solid (2) Echogenicity: isoechoic (1) Shape: not taller-than-wide (0) Margins: ill-defined (0) Echogenic foci: none (0) ACR TI-RADS total points: 3. ACR TI-RADS risk category: TR3 (3 points). ACR TI-RADS recommendations: Given size (<1.4 cm) and appearance, this nodule does NOT meet TI-RADS criteria for biopsy or dedicated follow-up. _________________________________________________________ Nodule # 2: Location: Right; inferior Maximum size: 0.7 cm; Other 2 dimensions: 0.7 x 0.7 cm Composition: solid/almost completely solid (2) Echogenicity: hypoechoic (2) Shape: not taller-than-wide (0) Margins: ill-defined (0) Echogenic foci: none (0) ACR TI-RADS total points: 4. ACR TI-RADS risk category: TR4 (4-6 points). ACR TI-RADS recommendations: Given size (<0.9 cm) and appearance, this nodule does NOT meet TI-RADS criteria for biopsy or dedicated follow-up. _________________________________________________________ Nodule # 3: Location: Left; inferior Maximum size: 1.0 cm; Other 2 dimensions: 0.9 cm Composition: solid/almost completely solid (2) Echogenicity: hypoechoic (2) Shape: taller-than-wide (3) Margins:  smooth (0) Echogenic foci: none (0) ACR TI-RADS total points: 7. ACR TI-RADS risk category: TR5 (>/= 7 points). ACR TI-RADS recommendations: **Given size (>/= 1.0 cm) and appearance, fine needle aspiration of this highly suspicious nodule should be considered based on TI-RADS criteria. _________________________________________________________ IMPRESSION: Nodule 3 (TI-RADS 5) meets criteria for FNA. The above is in keeping with the ACR TI-RADS recommendations - J Am Coll Radiol 2017;14:587-595. Electronically Signed   By: Miachel Roux M.D.   On: 08/30/2020 10:11

## 2020-09-04 ENCOUNTER — Ambulatory Visit (HOSPITAL_BASED_OUTPATIENT_CLINIC_OR_DEPARTMENT_OTHER)
Admission: RE | Admit: 2020-09-04 | Discharge: 2020-09-04 | Disposition: A | Discharge status: Home / Self Care | Payer: BC Managed Care – PPO | Source: Ambulatory Visit | Attending: Internal Medicine | Admitting: Internal Medicine

## 2020-09-04 ENCOUNTER — Telehealth: Payer: Self-pay | Admitting: Internal Medicine

## 2020-09-04 ENCOUNTER — Other Ambulatory Visit (HOSPITAL_BASED_OUTPATIENT_CLINIC_OR_DEPARTMENT_OTHER): Payer: Self-pay

## 2020-09-04 ENCOUNTER — Other Ambulatory Visit: Payer: Self-pay

## 2020-09-04 DIAGNOSIS — R7989 Other specified abnormal findings of blood chemistry: Secondary | ICD-10-CM | POA: Diagnosis not present

## 2020-09-04 MED ORDER — METOPROLOL SUCCINATE ER 25 MG PO TB24
25.0000 mg | ORAL_TABLET | Freq: Every day | ORAL | 1 refills | Status: DC
  Filled 2020-09-04: qty 90, 90d supply, fill #0
  Filled 2020-12-04: qty 90, 90d supply, fill #1

## 2020-09-04 NOTE — Telephone Encounter (Signed)
Rx sent 

## 2020-09-04 NOTE — Telephone Encounter (Signed)
Pt called stating is completely out of her meds for metoprolol succinate (TOPROL-XL) 25 MG 24 hr tablet and is needing meds to be refill ASAP, please send rx to [facility]. Please advise.

## 2020-09-06 ENCOUNTER — Other Ambulatory Visit (HOSPITAL_COMMUNITY)
Admission: RE | Admit: 2020-09-06 | Discharge: 2020-09-06 | Disposition: A | Discharge status: Home / Self Care | Payer: BC Managed Care – PPO | Source: Ambulatory Visit | Attending: Family Medicine | Admitting: Family Medicine

## 2020-09-06 ENCOUNTER — Ambulatory Visit
Admission: RE | Admit: 2020-09-06 | Discharge: 2020-09-06 | Disposition: A | Discharge status: Home / Self Care | Payer: BC Managed Care – PPO | Source: Ambulatory Visit | Attending: Family Medicine | Admitting: Family Medicine

## 2020-09-06 DIAGNOSIS — E041 Nontoxic single thyroid nodule: Secondary | ICD-10-CM | POA: Insufficient documentation

## 2020-09-07 LAB — CYTOLOGY - NON PAP

## 2020-09-08 ENCOUNTER — Other Ambulatory Visit: Payer: Self-pay | Admitting: Family Medicine

## 2020-09-08 ENCOUNTER — Encounter: Payer: Self-pay | Admitting: Family Medicine

## 2020-09-08 DIAGNOSIS — C73 Malignant neoplasm of thyroid gland: Secondary | ICD-10-CM

## 2020-09-09 DIAGNOSIS — E041 Nontoxic single thyroid nodule: Secondary | ICD-10-CM | POA: Diagnosis not present

## 2020-09-12 ENCOUNTER — Encounter: Payer: Self-pay | Admitting: Family Medicine

## 2020-09-13 ENCOUNTER — Telehealth: Payer: Self-pay | Admitting: Internal Medicine

## 2020-09-13 NOTE — Telephone Encounter (Signed)
I spoke with the patient, we went over her ultrasound and biopsy results. 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 She already has a appointment to see endocrinology in few days. She recently lost somebody to cancer and she was very distressed. She is counseled, most likely her long-term prognosis will be very good. At the end of the conversation she felt better emotionally. She knows to reach out to me if needed and will keep the appointment with me for 10/30/2020

## 2020-09-19 ENCOUNTER — Other Ambulatory Visit: Payer: Self-pay

## 2020-09-19 ENCOUNTER — Ambulatory Visit (INDEPENDENT_AMBULATORY_CARE_PROVIDER_SITE_OTHER): Payer: BC Managed Care – PPO | Admitting: Internal Medicine

## 2020-09-19 ENCOUNTER — Encounter: Payer: Self-pay | Admitting: Internal Medicine

## 2020-09-19 VITALS — BP 152/92 | HR 70 | Ht 61.0 in | Wt 164.4 lb

## 2020-09-19 DIAGNOSIS — E042 Nontoxic multinodular goiter: Secondary | ICD-10-CM | POA: Diagnosis not present

## 2020-09-19 NOTE — Progress Notes (Signed)
Name: Lauren Hodge  MRN/ DOB: [id number], 04/03/1956    Age/ Sex: 65 y.o., female    PCP: Colon Branch, MD   Reason for Endocrinology Evaluation: MNG     Date of Initial Endocrinology Evaluation: 09/19/2020     HPI: [name] is a 65 y.o. female with a past medical history of HTN.  The patient presented for initial endocrinology clinic visit on 09/19/2020 for consultative assistance with her MNG .   Pt is accompanied by her daughter and spouse. Daughter as the interpreter when needed today.    She has been noted with left thyroid nodule on physician exam prompting a thyroid ultrasound  (08/2020)which showed multinodular goiter with a left thyroid nodule 1 cm meeting criteria for FNA .  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 Carpal tunnel syndrome   . Fracture of carpal bone 2018   bilateral  . GERD (gastroesophageal reflux disease)   . Hemorrhoids   . Hiatal hernia 02/2019   seen on EGD   . HTN (hypertension)   . Hyperlipidemia   . Insomnia   . Osteoporosis   . Vitamin D deficiency     Past Surgical History:  Past Surgical History:  Procedure Laterality Date  . BREAST BIOPSY  2010   x2, 2010-2012 (no cancer)  . CHOLECYSTECTOMY  2015  . HEMORRHOIDECTOMY WITH HEMORRHOID BANDING  04/22/2017      Social History:  reports that she quit smoking about 2 years ago. Her smoking use included cigarettes. She has never used smokeless tobacco. She reports previous alcohol use. She reports previous drug use.  Family History: family history includes Cancer in her maternal aunt and maternal grandmother; Hypertension in her mother.   HOME MEDICATIONS: Allergies as of  09/19/2020   No Known Allergies     Medication List       Accurate as of Sep 19, 2020 11:12 AM. If you have any questions, ask your nurse or doctor.        STOP taking these medications   multivitamin-iron-minerals-folic acid chewable tablet Stopped by: Dorita Sciara, MD   VITAMIN D3 PO Stopped by: Dorita Sciara, MD     TAKE these medications   amLODipine 5 MG tablet Commonly known as: NORVASC Take 1 tablet (5 mg total) by mouth daily.   melatonin 3 MG Tabs tablet Take 3 mg by mouth as needed.   metoprolol succinate 25 MG 24 hr tablet Commonly known as: TOPROL-XL Take 1 tablet (25 mg total) by mouth daily with or immediately following a meal   Vitamin D (Ergocalciferol) 1.25 MG (50000 UNIT) Caps capsule Commonly known as: DRISDOL Take 1 capsule (50,000 Units total) by mouth every 7 (seven) days.         REVIEW OF SYSTEMS: A comprehensive ROS was conducted with the patient and is negative except as per HPI   OBJECTIVE:  VS: BP (!) 152/92   Pulse 70   Ht 5\' 1"  (1.549 m)   Wt 164 lb 6 oz (74.6 kg)   SpO2 98%   BMI 31.06 kg/m    Wt Readings from Last 3 Encounters:  09/19/20 164 lb 6  oz (74.6 kg)  08/24/20 164 lb 6.4 oz (74.6 kg)  03/22/19 169 lb (76.7 kg)     EXAM: General: Pt appears well and is in NAD  Neck: General: Supple without adenopathy. Thyroid: Thyroid size normal.  Right thyroid  nodule appreciated, I am unable to appreciate left nodule on today's exam.   Lungs: Clear with good BS bilat with no rales, rhonchi, or wheezes  Heart: Auscultation: RRR.  Abdomen: Normoactive bowel sounds, soft, nontender, without masses or organomegaly palpable  Extremities:  BL LE: No pretibial edema normal ROM and strength.  Skin: Hair: Texture and amount normal with gender appropriate distribution Skin Inspection: No rashes Skin Palpation: Skin temperature, texture, and thickness normal to palpation  Neuro:  DTRs: 2+ and symmetric in UE without  delay in relaxation phase  Mental Status: Judgment, insight: Intact Orientation: Oriented to time, place, and person Mood and affect: No depression, anxiety, or agitation     DATA REVIEWED:  Results for MYRLENE, RIERA (MRN [id number]) as of 09/19/2020 13:50  Ref. Range 08/24/2020 11:00  TSH Latest Ref Range: 0.35 - 4.50 uIU/mL 1.35     FNA 09/06/2020   Clinical History: Left; Inferior 1.0cm; Other 2 dimensions: 0.9cm, Solid  / almost completely solid, Hypoechoic, TI-RADS total points 7.  Specimen Submitted: A. 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 The sample has been sent for further Afirma testing and will wait on that prior to making a final determination whether surgical intervention is needed or not.    F/U in 6 months  Signed electronically by: Mack Guise, MD  [facility] Endocrinology  [facility] [address] [place], [place] [address] Phone: [phone] FAX: [phone]   CC: Colon Branch, [address] [place] [address] Phone: [phone] Fax: [phone]   Return to Endocrinology clinic as below: Future Appointments  Date Time Provider [place]  10/30/2020 10:40 AM Colon Branch, MD [facility]  11/01/2020  9:15 AM Lavonia Drafts, MD [facility] None

## 2020-09-19 NOTE — Patient Instructions (Signed)
-   PLease let me know when you get the molecular testing (Afirma) in case I don't get it.

## 2020-09-20 DIAGNOSIS — E119 Type 2 diabetes mellitus without complications: Secondary | ICD-10-CM | POA: Diagnosis not present

## 2020-09-21 ENCOUNTER — Telehealth: Payer: Self-pay | Admitting: Internal Medicine

## 2020-09-21 ENCOUNTER — Encounter (HOSPITAL_COMMUNITY): Payer: Self-pay

## 2020-09-21 NOTE — Telephone Encounter (Signed)
Pt's daughter states Dr. Kelton Pillar informed her to contact her once her mom's thyroid cancer results were back. Daughter would like a call back to see if pt needs an appt to come in. [phone]

## 2020-09-21 NOTE — Telephone Encounter (Signed)
Pt, daughter called to inform Dr. Larose Kells that her mom Results came in and she was positive thyroid cancer Please advice

## 2020-09-22 ENCOUNTER — Telehealth: Payer: Self-pay | Admitting: Internal Medicine

## 2020-09-22 NOTE — Telephone Encounter (Signed)
I had already spoken to the patient earlier today and gave her the benign results of Afirma.

## 2020-09-22 NOTE — Telephone Encounter (Signed)
See telephone note from endo.

## 2020-09-22 NOTE — Telephone Encounter (Signed)
Please advise 

## 2020-09-22 NOTE — Telephone Encounter (Signed)
Advise patient's daughter: Afirma test are back, I will ask endocrinology to reach out to them for further advice.

## 2020-09-22 NOTE — Telephone Encounter (Signed)
Spoke to the patient on 09/22/2020 at 12:30 PM   Discussed benign Afirma results, reassurance was provided today  Will follow-up in 6 months   [place], MD  [facility] Endocrinology  [facility] [address] [place], [place] [address] Phone: [phone] FAX: [phone]

## 2020-10-10 ENCOUNTER — Other Ambulatory Visit: Payer: Self-pay | Admitting: Internal Medicine

## 2020-10-10 DIAGNOSIS — Z1231 Encounter for screening mammogram for malignant neoplasm of breast: Secondary | ICD-10-CM

## 2020-10-20 DIAGNOSIS — E119 Type 2 diabetes mellitus without complications: Secondary | ICD-10-CM | POA: Diagnosis not present

## 2020-10-30 ENCOUNTER — Other Ambulatory Visit: Payer: Self-pay

## 2020-10-30 ENCOUNTER — Ambulatory Visit (INDEPENDENT_AMBULATORY_CARE_PROVIDER_SITE_OTHER): Payer: BC Managed Care – PPO | Admitting: Internal Medicine

## 2020-10-30 ENCOUNTER — Encounter: Payer: Self-pay | Admitting: Internal Medicine

## 2020-10-30 ENCOUNTER — Other Ambulatory Visit (HOSPITAL_BASED_OUTPATIENT_CLINIC_OR_DEPARTMENT_OTHER): Payer: Self-pay

## 2020-10-30 VITALS — BP 148/88 | HR 60 | Temp 98.2°F | Resp 16 | Ht 61.0 in | Wt 166.4 lb

## 2020-10-30 DIAGNOSIS — Z1159 Encounter for screening for other viral diseases: Secondary | ICD-10-CM | POA: Diagnosis not present

## 2020-10-30 DIAGNOSIS — Z114 Encounter for screening for human immunodeficiency virus [HIV]: Secondary | ICD-10-CM | POA: Diagnosis not present

## 2020-10-30 DIAGNOSIS — M810 Age-related osteoporosis without current pathological fracture: Secondary | ICD-10-CM

## 2020-10-30 DIAGNOSIS — Z0000 Encounter for general adult medical examination without abnormal findings: Secondary | ICD-10-CM | POA: Diagnosis not present

## 2020-10-30 DIAGNOSIS — Z23 Encounter for immunization: Secondary | ICD-10-CM

## 2020-10-30 MED ORDER — AMLODIPINE BESYLATE 5 MG PO TABS
5.0000 mg | ORAL_TABLET | Freq: Every day | ORAL | 3 refills | Status: DC
  Filled 2020-10-30: qty 90, 90d supply, fill #0
  Filled 2021-03-05: qty 90, 90d supply, fill #1

## 2020-10-30 MED ORDER — OMEPRAZOLE 40 MG PO CPDR
40.0000 mg | DELAYED_RELEASE_CAPSULE | Freq: Every day | ORAL | 3 refills | Status: DC
  Filled 2020-10-30: qty 90, 90d supply, fill #0

## 2020-10-30 NOTE — Patient Instructions (Addendum)
Go back on amlodipine 5 mg daily.  Were sending a 1 year refill   check the  blood pressure 2 or 3 times a month  BP GOAL is between 110/65 and  135/85. If it is consistently higher or lower, let me know  Finish the 50,000 units vitamin D weekly Then take vitamin D3 1000 units: 2 tablets every day  Schedule bone density test at the first floor (left from the elevator)   Having symptoms on and off, Rx omeprazole which she used to take according to the patient GO TO THE LAB : Get the blood work     [place], [place] back for a checkup in 4 to 5 months

## 2020-10-30 NOTE — Progress Notes (Signed)
Subjective:    Patient ID: Lauren Hodge, female    DOB: [birth year], 65 y.o.   MRN: [id number]  DOS:  10/30/2020 Type of visit - description: CPX  Here for CPX Since the last office visit, saw endocrinology, note reviewed. Complaining of pain on the right arm,  from the right elbow to the wrist, it increases with certain hand uses.  No swelling, no injury. She also have transient aches and pains in different places that resolve within minutes.  (Possibly DJD). She also has heartburn on and off. Specifically denies chest pain, difficulty breathing.    Review of Systems  Other than above, a 14 point review of systems is negative     Past Medical History:  Diagnosis Date   Carpal tunnel syndrome    Fracture of carpal bone 2018   bilateral   GERD (gastroesophageal reflux disease)    Hemorrhoids    Hiatal hernia 02/2019   seen on EGD    HTN (hypertension)    Hyperlipidemia    Insomnia    Osteoporosis    Vitamin D deficiency     Past Surgical History:  Procedure Laterality Date   BREAST BIOPSY  2010   x2, 2010-2012 (no cancer)   CHOLECYSTECTOMY  2015   HEMORRHOIDECTOMY WITH HEMORRHOID BANDING  04/22/2017   Social History   Socioeconomic History   Marital status: Married    Spouse name: Not on file   Number of children: 2   Years of education: Not on file   Highest education level: Not on file  Occupational History   Occupation: retired   Tobacco Use   Smoking status: Former    Pack years: 0.00    Types: Cigarettes    Quit date: 08/25/2018    Years since quitting: 2.1   Smokeless tobacco: Never   Tobacco comments:    used to smoke rarely   Substance and Sexual Activity   Alcohol use: Not Currently    Comment: occasional   Drug use: Not Currently   Sexual activity: Not Currently  Other Topics Concern   Not on file  Social History Narrative   From [place] from [place] 2019   Social Determinants of Health   Financial Resource Strain: Not on file   Food Insecurity: Not on file  Transportation Needs: Not on file  Physical Activity: Not on file  Stress: Not on file  Social Connections: Not on file  Intimate Partner Violence: Not on file    Allergies as of 10/30/2020   No Known Allergies      Medication List        Accurate as of October 30, 2020 11:59 PM. If you have any questions, ask your nurse or doctor.          amLODipine 5 MG tablet Commonly known as: NORVASC Tome 1 tableta (5 mg en total) por va oral diariamente. (Take 1 tablet (5 mg total) by mouth daily.)   melatonin 3 MG Tabs tablet Take 3 mg by mouth as needed.   metoprolol succinate 25 MG 24 hr tablet Commonly known as: TOPROL-XL Take 1 tablet (25 mg total) by mouth daily with or immediately following a meal   omeprazole 40 MG capsule Commonly known as: PRILOSEC Tome 1 cpsula (40 mg en total) por va oral diariamente antes de desayunar. (Take 1 capsule (40 mg total) by mouth daily before breakfast.) Started by: Kathlene November, MD   Vitamin D (Ergocalciferol) 1.25 MG (50000 UNIT) Caps capsule Commonly  known as: DRISDOL Take 1 capsule (50,000 Units total) by mouth every 7 (seven) days.           Objective:   Physical Exam BP (!) 148/88 (BP Location: Left Arm, Patient Position: Sitting, Cuff Size: Small)   Pulse 60   Temp 98.2 F (36.8 C) (Oral)   Resp 16   Ht 5\' 1"  (1.549 m)   Wt 166 lb 6 oz (75.5 kg)   SpO2 97%   BMI 31.44 kg/m  General: Well developed, NAD, BMI noted HEENT:  Normocephalic . Face symmetric, atraumatic Lungs:  CTA B Normal respiratory effort, no intercostal retractions, no accessory muscle use. Heart: RRR,  no murmur.  Abdomen:  Not distended, soft, non-tender. No rebound or rigidity.   Lower extremities: no pretibial edema bilaterally  Skin: Exposed areas without rash. Not pale. Not jaundice Neurologic:  alert & oriented X3.  Speech normal, gait appropriate for age and unassisted Strength symmetric and appropriate  for age.  Psych: Cognition and judgment appear intact.  Cooperative with normal attention span and concentration.  Behavior appropriate. No anxious or depressed appearing.     Assessment      Assessment, new patient 08/2020 HTN Hyperlipidemia GERD- HH EGD 2020 Vitamin D deficiency Osteoporosis? 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  BP today slightly elevated, RF amlodipine x1 year. GERD: RF medicines Vitamin D deficiency: Still on ergocalciferol, after that recommend vitamin D 2000 units daily. Osteoporosis: Order a bone density test Right arm pain: Conservative treatment Tylenol and avoid overuse, if not better she will call for a sports medicine referral.  Tendinitis?. 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  Safety protocols were in place, including screening questions prior to the visit, additional usage of staff PPE, and extensive cleaning of exam room while observing appropriate contact time as indicated for disinfecting solutions.

## 2020-10-31 ENCOUNTER — Encounter: Payer: Self-pay | Admitting: Internal Medicine

## 2020-10-31 DIAGNOSIS — Z0000 Encounter for general adult medical examination without abnormal findings: Secondary | ICD-10-CM | POA: Insufficient documentation

## 2020-10-31 LAB — HEPATITIS C ANTIBODY
Hepatitis C Ab: NONREACTIVE
SIGNAL TO CUT-OFF: 0.03 (ref <1.00)

## 2020-10-31 LAB — HIV ANTIBODY (ROUTINE TESTING W REFLEX): HIV 1&2 Ab, 4th Generation: NONREACTIVE

## 2020-10-31 NOTE — Assessment & Plan Note (Signed)
Here for CPX HTN: Was prescribed amlodipine, no side effects, she ran out.  BP today slightly elevated, RF amlodipine x1 year. GERD: RF medicines Vitamin D deficiency: Still on ergocalciferol, after that recommend vitamin D 2000 units daily. Osteoporosis: Order a bone density test Right arm pain: Conservative treatment Tylenol and avoid overuse, if not better she will call for a sports medicine referral.  Tendinitis?. 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

## 2020-10-31 NOTE — Assessment & Plan Note (Signed)
Tdap: today Shingrix: Discussed. Covid vax 2 , rec booster  CCS: Had a colonoscopy 02-2019, next per GI MMG: 05-2018, next scheduled 12/01/2020 Appointment with gynecology 11/01/2020 Labs: Hep C, HIV, Diet exercise: Discussed

## 2020-11-01 ENCOUNTER — Other Ambulatory Visit (HOSPITAL_COMMUNITY)
Admission: RE | Admit: 2020-11-01 | Discharge: 2020-11-01 | Disposition: A | Discharge status: Home / Self Care | Payer: BC Managed Care – PPO | Source: Ambulatory Visit | Attending: Obstetrics & Gynecology | Admitting: Obstetrics & Gynecology

## 2020-11-01 ENCOUNTER — Encounter: Payer: Self-pay | Admitting: Obstetrics & Gynecology

## 2020-11-01 ENCOUNTER — Other Ambulatory Visit: Payer: Self-pay

## 2020-11-01 ENCOUNTER — Ambulatory Visit (INDEPENDENT_AMBULATORY_CARE_PROVIDER_SITE_OTHER): Payer: BC Managed Care – PPO | Admitting: Obstetrics & Gynecology

## 2020-11-01 VITALS — BP 171/76 | HR 75 | Ht 61.0 in | Wt 166.0 lb

## 2020-11-01 DIAGNOSIS — Z780 Asymptomatic menopausal state: Secondary | ICD-10-CM

## 2020-11-01 DIAGNOSIS — Z01419 Encounter for gynecological examination (general) (routine) without abnormal findings: Secondary | ICD-10-CM | POA: Diagnosis not present

## 2020-11-01 NOTE — Progress Notes (Signed)
[employer] language interpreter [place] [phone].

## 2020-11-01 NOTE — Progress Notes (Signed)
Subjective:     Lauren Hodge is a 65 y.o. female here for a routine exam. G2P2002 LMP in late 44's. Current complaints: none. Pt is worried about GYN exam as it has been so long since she had her last exam. Pt is married. Not sexually active.  She reports occ leakage of urine with heavy laughing.   Gynecologic History No LMP recorded. Patient is postmenopausal. Contraception: post menopausal status Last Pap: unknown.  Last mammogram: 2018. Results were: abnormal- cysts in breasts. Pt has clips in place. Has appt for 12/01/2020   Obstetric History OB History  Gravida Para Term Preterm AB Living  2 2 2     2   SAB IAB Ectopic Multiple Live Births          2    # Outcome Date GA Lbr Len/2nd Weight Sex Delivery Anes PTL Lv  2 Term 51 [redacted]w[redacted]d   M CS-LTranv EPI N LIV  1 Term 32 [redacted]w[redacted]d   F Vag-Spont None N LIV     The following portions of the patient's history were reviewed and updated as appropriate: allergies, current medications, past family history, past medical history, past social history, past surgical history, and problem list.  Review of Systems Pertinent items are noted in HPI.    Objective:  BP (!) 171/76   Pulse 75   Ht 5\' 1"  (1.549 m)   Wt 166 lb (75.3 kg)   BMI 31.37 kg/m  General Appearance:    Alert, cooperative, no distress, appears stated age  Head:    Normocephalic, without obvious abnormality, atraumatic  Eyes:    conjunctiva/corneas clear, EOM's intact, both eyes  Ears:    Normal external ear canals, both ears  Nose:   Nares normal, septum midline, mucosa normal, no drainage    or sinus tenderness  Throat:   Lips, mucosa, and tongue normal; teeth and gums normal  Neck:   Supple, symmetrical, trachea midline, no adenopathy;    thyroid:  no enlargement/tenderness/nodules  Back:     Symmetric, no curvature, ROM normal, no CVA tenderness  Lungs:     respirations unlabored  Chest Wall:    No tenderness or deformity   Heart:    Regular rate and rhythm   Breast Exam:    No tenderness, masses, or nipple abnormality  Abdomen:     Soft, non-tender, bowel sounds active all four quadrants,    no masses, no organomegaly. Well healed transverse incision. Periumbilical incision.   Genitalia:    Normal female without lesion, discharge or tenderness     Extremities:   Extremities normal, atraumatic, no cyanosis or edema  Pulses:   2+ and symmetric all extremities  Skin:   Skin color, texture, turgor normal, no rashes or lesions     Assessment:    Healthy female exam.  Asymptomatic menopausal state H/o abnormal breast screening. No screening for 4 years. Reviewed screening guidelines for breast cancer.  All questions answered.     Plan:  [name] was seen today for annual exam.  Diagnoses and all orders for this visit:  Well female exam with routine gynecological exam -     Cytology - PAP( [facility]) -     MM DIGITAL SCREENING BILATERAL; Future  Asymptomatic menopausal state   F/u in 1 year or sooner prn

## 2020-11-06 LAB — CYTOLOGY - PAP
Comment: NEGATIVE
Diagnosis: NEGATIVE
High risk HPV: NEGATIVE

## 2020-11-20 DIAGNOSIS — E119 Type 2 diabetes mellitus without complications: Secondary | ICD-10-CM | POA: Diagnosis not present

## 2020-12-01 ENCOUNTER — Other Ambulatory Visit: Payer: Self-pay

## 2020-12-01 ENCOUNTER — Ambulatory Visit
Admission: RE | Admit: 2020-12-01 | Discharge: 2020-12-01 | Disposition: A | Discharge status: Home / Self Care | Payer: BC Managed Care – PPO | Source: Ambulatory Visit | Attending: Internal Medicine | Admitting: Internal Medicine

## 2020-12-01 DIAGNOSIS — Z1231 Encounter for screening mammogram for malignant neoplasm of breast: Secondary | ICD-10-CM

## 2020-12-01 HISTORY — DX: Other specified postprocedural states: Z98.890

## 2020-12-04 ENCOUNTER — Other Ambulatory Visit (HOSPITAL_BASED_OUTPATIENT_CLINIC_OR_DEPARTMENT_OTHER): Payer: Self-pay

## 2020-12-06 ENCOUNTER — Other Ambulatory Visit: Payer: Self-pay | Admitting: Internal Medicine

## 2020-12-06 DIAGNOSIS — R928 Other abnormal and inconclusive findings on diagnostic imaging of breast: Secondary | ICD-10-CM

## 2020-12-07 ENCOUNTER — Other Ambulatory Visit (HOSPITAL_BASED_OUTPATIENT_CLINIC_OR_DEPARTMENT_OTHER): Payer: Self-pay | Admitting: Internal Medicine

## 2020-12-07 ENCOUNTER — Other Ambulatory Visit: Payer: Self-pay

## 2020-12-07 ENCOUNTER — Ambulatory Visit (HOSPITAL_BASED_OUTPATIENT_CLINIC_OR_DEPARTMENT_OTHER): Admission: RE | Admit: 2020-12-07 | Payer: BC Managed Care – PPO | Source: Ambulatory Visit

## 2020-12-07 ENCOUNTER — Ambulatory Visit (HOSPITAL_BASED_OUTPATIENT_CLINIC_OR_DEPARTMENT_OTHER)
Admission: RE | Admit: 2020-12-07 | Discharge: 2020-12-07 | Disposition: A | Discharge status: Home / Self Care | Payer: BC Managed Care – PPO | Source: Ambulatory Visit | Attending: Internal Medicine | Admitting: Internal Medicine

## 2020-12-07 DIAGNOSIS — Z780 Asymptomatic menopausal state: Secondary | ICD-10-CM | POA: Diagnosis not present

## 2020-12-07 DIAGNOSIS — M8589 Other specified disorders of bone density and structure, multiple sites: Secondary | ICD-10-CM | POA: Diagnosis not present

## 2020-12-07 DIAGNOSIS — Z13820 Encounter for screening for osteoporosis: Secondary | ICD-10-CM

## 2020-12-07 DIAGNOSIS — M810 Age-related osteoporosis without current pathological fracture: Secondary | ICD-10-CM | POA: Diagnosis not present

## 2020-12-12 ENCOUNTER — Other Ambulatory Visit: Payer: Self-pay

## 2020-12-12 ENCOUNTER — Other Ambulatory Visit (HOSPITAL_BASED_OUTPATIENT_CLINIC_OR_DEPARTMENT_OTHER): Payer: Self-pay

## 2020-12-12 DIAGNOSIS — M810 Age-related osteoporosis without current pathological fracture: Secondary | ICD-10-CM

## 2020-12-12 MED ORDER — ALENDRONATE SODIUM 70 MG PO TABS
70.0000 mg | ORAL_TABLET | ORAL | 5 refills | Status: DC
  Filled 2020-12-12: qty 4, 28d supply, fill #0

## 2020-12-12 NOTE — Progress Notes (Signed)
Patient advised of results and given specific instructions on how to take once a week and sit in upright position for 90 minutes. She verbalized understanding. Rx sent to her pharmacy of choice. (This conversation was in [place])

## 2020-12-19 ENCOUNTER — Other Ambulatory Visit: Payer: Self-pay | Admitting: Internal Medicine

## 2020-12-20 ENCOUNTER — Other Ambulatory Visit: Payer: Self-pay | Admitting: Internal Medicine

## 2020-12-20 ENCOUNTER — Ambulatory Visit
Admission: RE | Admit: 2020-12-20 | Discharge: 2020-12-20 | Disposition: A | Discharge status: Home / Self Care | Payer: BC Managed Care – PPO | Source: Ambulatory Visit | Attending: Internal Medicine | Admitting: Internal Medicine

## 2020-12-20 ENCOUNTER — Other Ambulatory Visit: Payer: Self-pay

## 2020-12-20 DIAGNOSIS — R928 Other abnormal and inconclusive findings on diagnostic imaging of breast: Secondary | ICD-10-CM

## 2020-12-20 DIAGNOSIS — N6489 Other specified disorders of breast: Secondary | ICD-10-CM

## 2020-12-21 DIAGNOSIS — E119 Type 2 diabetes mellitus without complications: Secondary | ICD-10-CM | POA: Diagnosis not present

## 2020-12-22 ENCOUNTER — Ambulatory Visit
Admission: RE | Admit: 2020-12-22 | Discharge: 2020-12-22 | Disposition: A | Discharge status: Home / Self Care | Payer: BC Managed Care – PPO | Source: Ambulatory Visit | Attending: Internal Medicine | Admitting: Internal Medicine

## 2020-12-22 ENCOUNTER — Other Ambulatory Visit: Payer: Self-pay

## 2020-12-22 DIAGNOSIS — N6489 Other specified disorders of breast: Secondary | ICD-10-CM

## 2020-12-22 DIAGNOSIS — N6012 Diffuse cystic mastopathy of left breast: Secondary | ICD-10-CM | POA: Diagnosis not present

## 2020-12-22 DIAGNOSIS — R928 Other abnormal and inconclusive findings on diagnostic imaging of breast: Secondary | ICD-10-CM | POA: Diagnosis not present

## 2021-01-20 DIAGNOSIS — E119 Type 2 diabetes mellitus without complications: Secondary | ICD-10-CM | POA: Diagnosis not present

## 2021-02-20 DIAGNOSIS — E119 Type 2 diabetes mellitus without complications: Secondary | ICD-10-CM | POA: Diagnosis not present

## 2021-03-05 ENCOUNTER — Ambulatory Visit (INDEPENDENT_AMBULATORY_CARE_PROVIDER_SITE_OTHER): Payer: BC Managed Care – PPO | Admitting: Internal Medicine

## 2021-03-05 ENCOUNTER — Encounter: Payer: Self-pay | Admitting: Internal Medicine

## 2021-03-05 ENCOUNTER — Other Ambulatory Visit (HOSPITAL_BASED_OUTPATIENT_CLINIC_OR_DEPARTMENT_OTHER): Payer: Self-pay

## 2021-03-05 ENCOUNTER — Ambulatory Visit: Payer: BC Managed Care – PPO | Admitting: Internal Medicine

## 2021-03-05 ENCOUNTER — Other Ambulatory Visit: Payer: Self-pay | Admitting: Internal Medicine

## 2021-03-05 ENCOUNTER — Other Ambulatory Visit: Payer: Self-pay

## 2021-03-05 VITALS — BP 126/80 | HR 67 | Temp 98.2°F | Resp 16 | Ht 61.0 in | Wt 167.1 lb

## 2021-03-05 DIAGNOSIS — R7989 Other specified abnormal findings of blood chemistry: Secondary | ICD-10-CM

## 2021-03-05 DIAGNOSIS — E559 Vitamin D deficiency, unspecified: Secondary | ICD-10-CM

## 2021-03-05 DIAGNOSIS — K047 Periapical abscess without sinus: Secondary | ICD-10-CM

## 2021-03-05 DIAGNOSIS — M25531 Pain in right wrist: Secondary | ICD-10-CM

## 2021-03-05 DIAGNOSIS — I10 Essential (primary) hypertension: Secondary | ICD-10-CM

## 2021-03-05 DIAGNOSIS — Z23 Encounter for immunization: Secondary | ICD-10-CM

## 2021-03-05 DIAGNOSIS — M810 Age-related osteoporosis without current pathological fracture: Secondary | ICD-10-CM

## 2021-03-05 LAB — COMPREHENSIVE METABOLIC PANEL
ALT: 32 U/L (ref 0–35)
AST: 25 U/L (ref 0–37)
Albumin: 4.2 g/dL (ref 3.5–5.2)
Alkaline Phosphatase: 117 U/L (ref 39–117)
BUN: 16 mg/dL (ref 6–23)
CO2: 29 mEq/L (ref 19–32)
Calcium: 8.9 mg/dL (ref 8.4–10.5)
Chloride: 104 mEq/L (ref 96–112)
Creatinine, Ser: 0.52 mg/dL (ref 0.40–1.20)
GFR: 98.88 mL/min (ref >60.00)
Glucose, Bld: 98 mg/dL (ref 70–99)
Potassium: 4 mEq/L (ref 3.5–5.1)
Sodium: 140 mEq/L (ref 135–145)
Total Bilirubin: 0.5 mg/dL (ref 0.2–1.2)
Total Protein: 7.5 g/dL (ref 6.0–8.3)

## 2021-03-05 LAB — VITAMIN D 25 HYDROXY (VIT D DEFICIENCY, FRACTURES): VITD: 30.99 ng/mL (ref 30.00–100.00)

## 2021-03-05 MED ORDER — METOPROLOL SUCCINATE ER 25 MG PO TB24
25.0000 mg | ORAL_TABLET | Freq: Every day | ORAL | 1 refills | Status: DC
  Filled 2021-03-05: qty 90, 90d supply, fill #0
  Filled 2021-06-04: qty 90, 90d supply, fill #1

## 2021-03-05 MED ORDER — AMOXICILLIN 875 MG PO TABS
875.0000 mg | ORAL_TABLET | Freq: Two times a day (BID) | ORAL | 0 refills | Status: DC
  Filled 2021-03-05: qty 14, 7d supply, fill #0

## 2021-03-05 NOTE — Patient Instructions (Addendum)
Proceed with your COVID-vaccine  Try to get a dental appointment at [facility]: Make An Appointment Call [phone], ext. [phone] for more information or to arrange an appointment. Appointment times vary semester to semester and generally last three hours.  GO TO THE LAB : Get the blood work     [place], [place] back for a checkup in 6 months

## 2021-03-05 NOTE — Assessment & Plan Note (Signed)
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  Check CMP Vitamin D deficiency: Checking labs.  On OTCs Osteoporosis: T score was -2.6 on August 2022, declined to take Fosamax, plans to take daily walks and continue vitamin D supplements.  We will check a DEXA in 2024. Increased LFTs: Ultrasound abdomen 08/2020: Changes consistent with fatty liver.  Hep C negative.  Check hep B. Alkaline phosphatase is slightly elevated, she is having dental pain with some loss of bone mass on physical exam, related?. Dental infection?  Several teeth absent including #3 4 and 5, area seems irritated with no obvious abscess.  She definitely needs to see a dentist but due to cost she has been unable to.  Requested antibiotic, amoxicillin for 1 week. Right arm pain: Likely tendinitis, recommend Voltaren gel.  Refer to Ortho. Preventive care: Flu shot today, recommend to proceed with COVID-vaccine. Follow-up 6 months

## 2021-03-05 NOTE — Progress Notes (Signed)
Subjective:    Patient ID: Lauren Hodge, female    DOB: 08/16/1955, 65 y.o.   MRN: [id number]  DOS:  03/05/2021 Type of visit - description: f/u  We discussed several issues Hypertension, good compliance with medication, no ambulatory BPs. Osteoporosis: Decided not to take Fosamax Vitamin D deficiency: Good compliance with supplements. Continue with right arm pain, lateral aspect of the arm, mostly with hand and fingers use.  No paresthesias. Has a dental problem,see physical exam .  Review of Systems See above   Past Medical History:  Diagnosis Date   Carpal tunnel syndrome    Fracture of carpal bone 2018   bilateral   GERD (gastroesophageal reflux disease)    Hemorrhoids    Hiatal hernia 02/2019   seen on EGD    History of left breast biopsy    Benign   HTN (hypertension)    Hyperlipidemia    Insomnia    Osteoporosis    Vitamin D deficiency     Past Surgical History:  Procedure Laterality Date   BREAST BIOPSY  2010   x2, 2010-2012 (no cancer)   CHOLECYSTECTOMY  2015   HEMORRHOIDECTOMY WITH HEMORRHOID BANDING  04/22/2017    Allergies as of 03/05/2021   No Known Allergies      Medication List        Accurate as of March 05, 2021  9:07 AM. If you have any questions, ask your nurse or doctor.          amLODipine 5 MG tablet Commonly known as: NORVASC Tome 1 tableta (5 mg en total) por va oral diariamente. (Take 1 tablet (5 mg total) by mouth daily.)   melatonin 3 MG Tabs tablet Take 3 mg by mouth as needed.   metoprolol succinate 25 MG 24 hr tablet Commonly known as: TOPROL-XL Take 1 tablet (25 mg total) by mouth daily with or immediately following a meal   multivitamin capsule Take 1 capsule by mouth daily.   omeprazole 40 MG capsule Commonly known as: PRILOSEC Tome 1 cpsula (40 mg en total) por va oral diariamente antes de desayunar. (Take 1 capsule (40 mg total) by mouth daily before breakfast.)   Vitamin D (Ergocalciferol)  1.25 MG (50000 UNIT) Caps capsule Commonly known as: DRISDOL Take 1 capsule (50,000 Units total) by mouth every 7 (seven) days.           Objective:   Physical Exam BP 126/80 (BP Location: Left Arm, Patient Position: Sitting, Cuff Size: Small)   Pulse 67   Temp 98.2 F (36.8 C) (Oral)   Resp 16   Ht 5\' 1"  (1.549 m)   Wt 167 lb 2 oz (75.8 kg)   SpO2 97%   BMI 31.58 kg/m  General:   Well developed, NAD, BMI noted. HEENT:  Normocephalic . Face symmetric, atraumatic Inspection and palpation of the mouth: At the area of tooth 3, 4, 5 >> gum seems  erythematous, slightly TTP, no obvious abscess.  Some bone reabsorption?. Lungs:  CTA B Normal respiratory effort, no intercostal retractions, no accessory muscle use. Heart: RRR,  no murmur.  Lower extremities: no pretibial edema bilaterally  Skin: Not pale. Not jaundice Neurologic:  alert & oriented X3.  Speech normal, gait appropriate for age and unassisted Psych--  Cognition and judgment appear intact.  Cooperative with normal attention span and concentration.  Behavior appropriate. No anxious or depressed appearing.      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  Check CMP Vitamin D deficiency: Checking labs.  On OTCs Osteoporosis: T score was -2.6 on August 2022, declined to take Fosamax, plans to take daily walks and continue vitamin D supplements.  We will check a DEXA in 2024. Increased LFTs: Ultrasound abdomen 08/2020: Changes consistent with fatty liver.  Hep C negative.  Check hep B. Alkaline phosphatase is slightly elevated, she is having dental pain with some loss of bone mass on physical  exam, related?. Dental infection?  Several teeth absent including #3 4 and 5, area seems irritated with no obvious abscess.  She definitely needs to see a dentist but due to cost she has been unable to.  Requested antibiotic, amoxicillin for 1 week. Right arm pain: Likely tendinitis, recommend Voltaren gel.  Refer to Ortho. Preventive care: Flu shot today, recommend to proceed with COVID-vaccine. Follow-up 6 months  This visit occurred during the SARS-CoV-2 public health emergency.  Safety protocols were in place, including screening questions prior to the visit, additional usage of staff PPE, and extensive cleaning of exam room while observing appropriate contact time as indicated for disinfecting solutions.

## 2021-03-06 LAB — HEPATITIS B SURFACE ANTIGEN: Hepatitis B Surface Ag: NONREACTIVE

## 2021-03-06 LAB — HEPATITIS B CORE ANTIBODY, TOTAL: Hep B Core Total Ab: NONREACTIVE

## 2021-03-22 DIAGNOSIS — E119 Type 2 diabetes mellitus without complications: Secondary | ICD-10-CM | POA: Diagnosis not present

## 2021-04-12 ENCOUNTER — Telehealth (INDEPENDENT_AMBULATORY_CARE_PROVIDER_SITE_OTHER): Payer: BC Managed Care – PPO | Admitting: Nurse Practitioner

## 2021-04-12 ENCOUNTER — Other Ambulatory Visit (HOSPITAL_BASED_OUTPATIENT_CLINIC_OR_DEPARTMENT_OTHER): Payer: Self-pay

## 2021-04-12 ENCOUNTER — Other Ambulatory Visit: Payer: Self-pay

## 2021-04-12 VITALS — Temp 98.0°F

## 2021-04-12 DIAGNOSIS — Z20822 Contact with and (suspected) exposure to covid-19: Secondary | ICD-10-CM | POA: Diagnosis not present

## 2021-04-12 DIAGNOSIS — R051 Acute cough: Secondary | ICD-10-CM | POA: Insufficient documentation

## 2021-04-12 DIAGNOSIS — U071 COVID-19: Secondary | ICD-10-CM | POA: Diagnosis not present

## 2021-04-12 MED ORDER — BENZONATATE 100 MG PO CAPS
100.0000 mg | ORAL_CAPSULE | Freq: Three times a day (TID) | ORAL | 0 refills | Status: AC | PRN
  Filled 2021-04-12: qty 21, 7d supply, fill #0

## 2021-04-12 MED ORDER — NIRMATRELVIR/RITONAVIR (PAXLOVID)TABLET
3.0000 | ORAL_TABLET | Freq: Two times a day (BID) | ORAL | 0 refills | Status: AC
  Filled 2021-04-12: qty 30, 5d supply, fill #0

## 2021-04-12 NOTE — Assessment & Plan Note (Signed)
Patient has a positive for COVID-19.  Did discuss antiviral treatments nightly emergency use authorized only.  Patient acknowledged understood after joint discussion decided to pursue antiviral treatment.  Discussed common side effects of medication.  Did do a drug to drug interaction with up-to-date program.  Patient's GFR within limits and was drawn last month.  Did discuss CDC guidelines in regards to quarantine recommendations.  Discussed signs and symptoms when to seek urgent or emergent health care. Start paxlovid as soon as possible

## 2021-04-12 NOTE — Assessment & Plan Note (Signed)
We will use Tessalon Perles 100 mg 3 times daily as needed cough.

## 2021-04-12 NOTE — Progress Notes (Signed)
Patient ID: Lauren Hodge, female    DOB: June 13, 1955, 65 y.o.   MRN: [id number]  Virtual visit completed through [place], a video enabled telemedicine application. Due to national recommendations of social distancing due to COVID-19, a virtual visit is felt to be most appropriate for this patient at this time. Reviewed limitations, risks, security and privacy concerns of performing a virtual visit and the availability of in person appointments. I also reviewed that there may be a patient responsible charge related to this service. The patient agreed to proceed.   Patient location: home Provider location: [place] [facility], office Persons participating in this virtual visit: patient, provider   If any vitals were documented, they were collected by patient at home unless specified below.    Temp 98 F (36.7 C) Comment: per patient now, took tylenol 1 hour ago   CC: Covid 19 Subjective:   HPI: Lauren Hodge is a 65 y.o. female presenting on 04/12/2021 for Covid Positive (On 04/11/21, sx started 3 days ago-04/10/21- terrible cough, nausea, body aches, headache, chills, temp was 101, sore throat. )  Symptoms 04/10/2021 Covid tested yesterday at home and it was positive Pfizer vaccine x2 No sick contacts Has been using tylenol and thera flu, not much help  Used [employer] interpretor  Name: Rebeca  ID: [id number]  Relevant past medical, surgical, family and social history reviewed and updated as indicated. Interim medical history since our last visit reviewed. Allergies and medications reviewed and updated. Outpatient Medications Prior to Visit  Medication Sig Dispense Refill   metoprolol succinate (TOPROL-XL) 25 MG 24 hr tablet Take 1 tablet (25 mg total) by mouth daily with or immediately following a meal 90 tablet 1   VITAMIN D, CHOLECALCIFEROL, PO Take by mouth.     amLODipine (NORVASC) 5 MG tablet Take 1 tablet (5 mg total) by mouth daily. 90 tablet 3   amoxicillin  (AMOXIL) 875 MG tablet Take 1 tablet (875 mg total) by mouth 2 (two) times daily. 14 tablet 0   Melatonin 3 MG TABS Take 3 mg by mouth as needed. (Patient not taking: No sig reported)     Multiple Vitamin (MULTIVITAMIN) capsule Take 1 capsule by mouth daily.     omeprazole (PRILOSEC) 40 MG capsule Take 1 capsule (40 mg total) by mouth daily before breakfast. 90 capsule 3   Vitamin D, Ergocalciferol, (DRISDOL) 1.25 MG (50000 UNIT) CAPS capsule Take 1 capsule (50,000 Units total) by mouth every 7 (seven) days. (Patient not taking: Reported on 03/05/2021) 12 capsule 0   No facility-administered medications prior to visit.     Per HPI unless specifically indicated in ROS section below Review of Systems  Constitutional:  Positive for chills, fatigue and fever.  HENT:  Positive for congestion and sore throat. Negative for ear discharge and ear pain.   Respiratory:  Positive for cough (clear and coloreds). Negative for shortness of breath.   Cardiovascular:  Negative for chest pain.  Gastrointestinal:  Positive for nausea and vomiting. Negative for abdominal pain and diarrhea.  Musculoskeletal:  Positive for myalgias. Negative for arthralgias.  Neurological:  Positive for headaches.  Objective:  Temp 98 F (36.7 C) Comment: per patient now, took tylenol 1 hour ago  Wt Readings from Last 3 Encounters:  03/05/21 167 lb 2 oz (75.8 kg)  11/01/20 166 lb (75.3 kg)  10/30/20 166 lb 6 oz (75.5 kg)       Physical exam: Gen: alert, NAD, not ill appearing Pulm: speaks  in complete sentences without increased work of breathing Psych: normal mood, normal thought content      Results for orders placed or performed in visit on 03/05/21  Hepatitis B core antibody, total  Result Value Ref Range   Hep B Core Total Ab NON-REACTIVE NON-REACTIVE  Hepatitis B surface antigen  Result Value Ref Range   Hepatitis B Surface Ag NON-REACTIVE NON-REACTIVE  Comp Met (CMET)  Result Value Ref Range   Sodium 140  135 - 145 mEq/L   Potassium 4.0 3.5 - 5.1 mEq/L   Chloride 104 96 - 112 mEq/L   CO2 29 19 - 32 mEq/L   Glucose, Bld 98 70 - 99 mg/dL   BUN 16 6 - 23 mg/dL   Creatinine, Ser 0.52 0.40 - 1.20 mg/dL   Total Bilirubin 0.5 0.2 - 1.2 mg/dL   Alkaline Phosphatase 117 39 - 117 U/L   AST 25 0 - 37 U/L   ALT 32 0 - 35 U/L   Total Protein 7.5 6.0 - 8.3 g/dL   Albumin 4.2 3.5 - 5.2 g/dL   GFR 98.88 >60.00 mL/min   Calcium 8.9 8.4 - 10.5 mg/dL  Vitamin D (25 hydroxy)  Result Value Ref Range   VITD 30.99 30.00 - 100.00 ng/mL   Assessment & Plan:   Problem List Items Addressed This Visit       Other   COVID-19 - Primary    Patient has a positive for COVID-19.  Did discuss antiviral treatments nightly emergency use authorized only.  Patient acknowledged understood after joint discussion decided to pursue antiviral treatment.  Discussed common side effects of medication.  Did do a drug to drug interaction with up-to-date program.  Patient's GFR within limits and was drawn last month.  Did discuss CDC guidelines in regards to quarantine recommendations.  Discussed signs and symptoms when to seek urgent or emergent health care. Start paxlovid as soon as possible      Relevant Medications   nirmatrelvir/ritonavir EUA (PAXLOVID) 20 x 150 MG & 10 x 100MG TABS   Acute cough    We will use Tessalon Perles 100 mg 3 times daily as needed cough.      Relevant Medications   benzonatate (TESSALON) 100 MG capsule     No orders of the defined types were placed in this encounter.  No orders of the defined types were placed in this encounter.   I discussed the assessment and treatment plan with the patient. The patient was provided an opportunity to ask questions and all were answered. The patient agreed with the plan and demonstrated an understanding of the instructions. The patient was advised to call back or seek an in-person evaluation if the symptoms worsen or if the condition fails to improve as  anticipated.  Follow up plan: No follow-ups on file.  Romilda Garret, NP

## 2021-04-22 DIAGNOSIS — E119 Type 2 diabetes mellitus without complications: Secondary | ICD-10-CM | POA: Diagnosis not present

## 2021-05-23 DIAGNOSIS — E119 Type 2 diabetes mellitus without complications: Secondary | ICD-10-CM | POA: Diagnosis not present

## 2021-05-29 ENCOUNTER — Other Ambulatory Visit (HOSPITAL_BASED_OUTPATIENT_CLINIC_OR_DEPARTMENT_OTHER): Payer: Self-pay

## 2021-05-29 MED ORDER — PENICILLIN V POTASSIUM 500 MG PO TABS
500.0000 mg | ORAL_TABLET | Freq: Four times a day (QID) | ORAL | 0 refills | Status: DC
  Filled 2021-05-29: qty 28, 7d supply, fill #0

## 2021-05-29 MED ORDER — CHLORHEXIDINE GLUCONATE 0.12 % MT SOLN
OROMUCOSAL | 0 refills | Status: DC
  Filled 2021-05-29: qty 473, 30d supply, fill #0

## 2021-06-04 ENCOUNTER — Other Ambulatory Visit (HOSPITAL_BASED_OUTPATIENT_CLINIC_OR_DEPARTMENT_OTHER): Payer: Self-pay

## 2021-06-20 DIAGNOSIS — E119 Type 2 diabetes mellitus without complications: Secondary | ICD-10-CM | POA: Diagnosis not present

## 2021-07-21 DIAGNOSIS — E119 Type 2 diabetes mellitus without complications: Secondary | ICD-10-CM | POA: Diagnosis not present

## 2021-08-01 IMAGING — US US THYROID
2 series · 13 of 25 positions shown · non-contrast
Comparison: None.

CLINICAL DATA: Right thyroid nodule

EXAM:
THYROID ULTRASOUND
TECHNIQUE: Ultrasound examination of the thyroid gland and adjacent soft
tissues was performed.

[Series 1: us thyroid · 12 of 30 slices shown (1 of 2)]
[im 1/30]
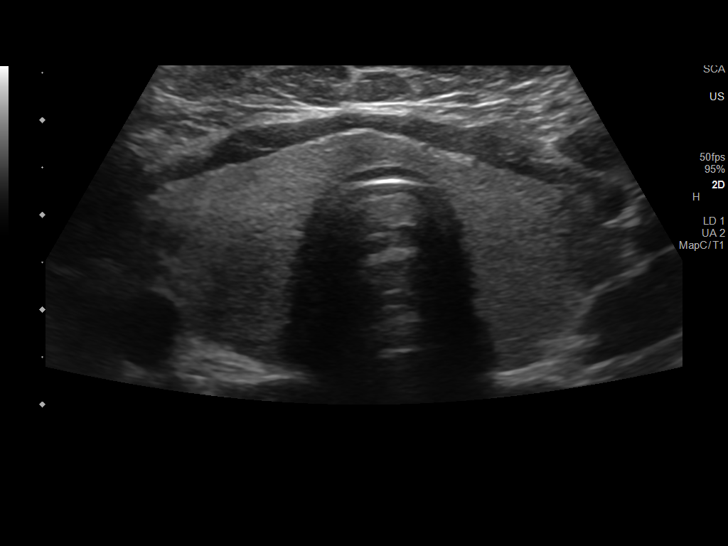
[im 3/30]
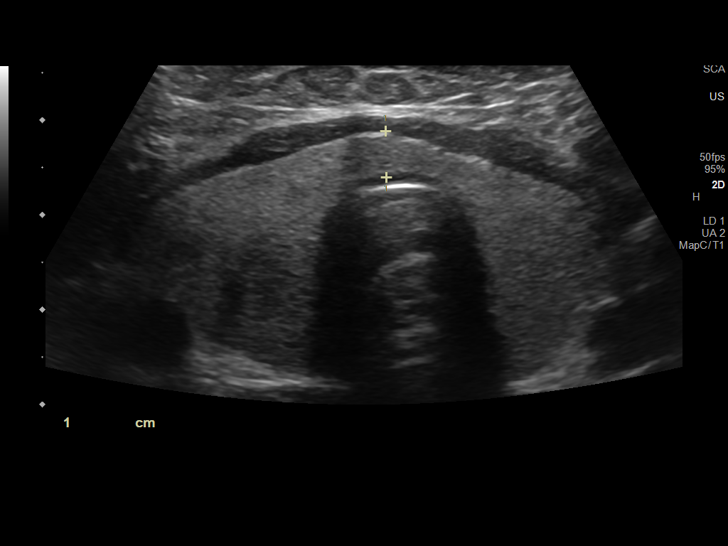
[im 6/30]
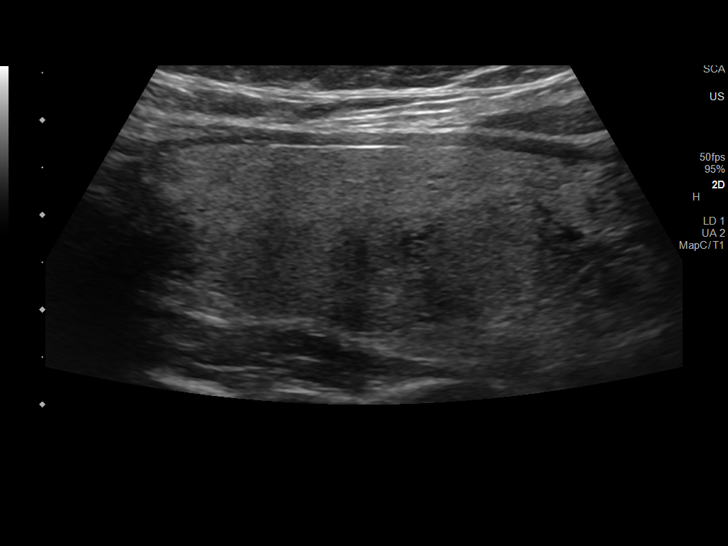
[im 8/30]
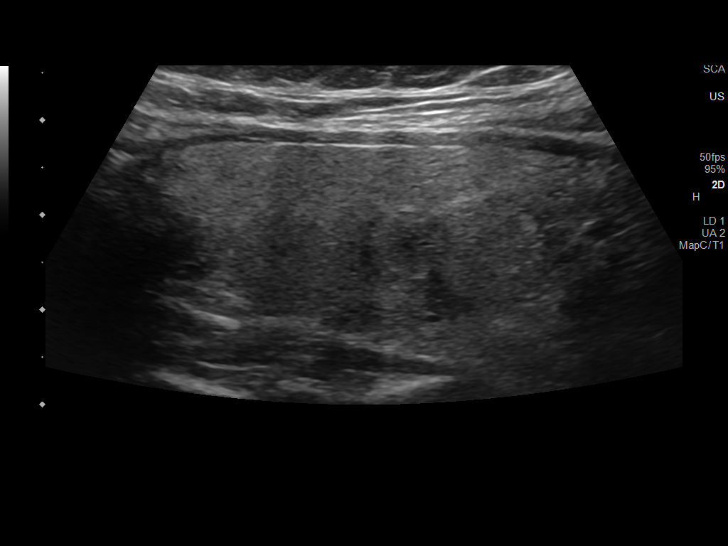
[im 11/30]
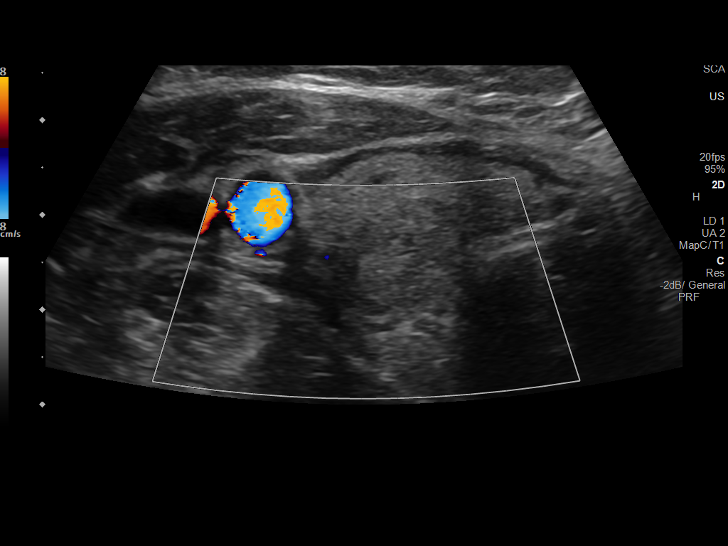
[im 13/30]
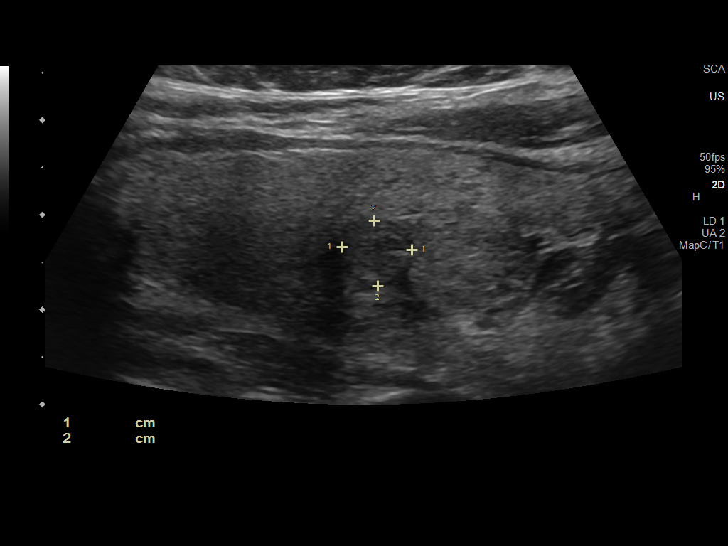
[im 16/30]
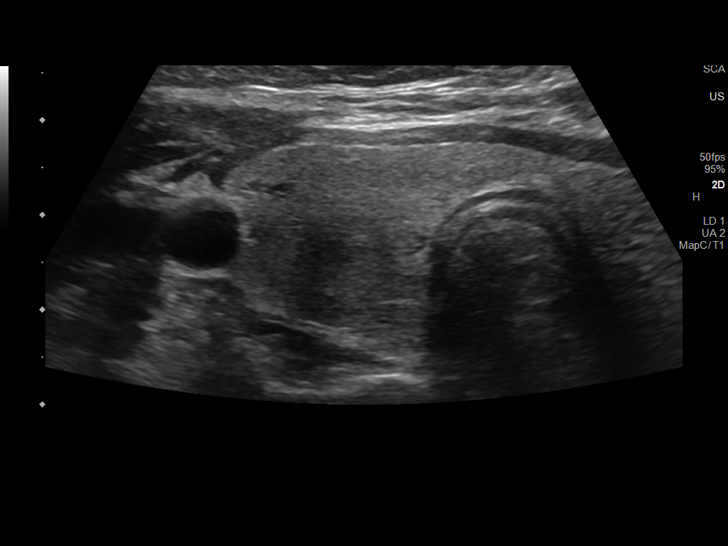
[im 18/30]
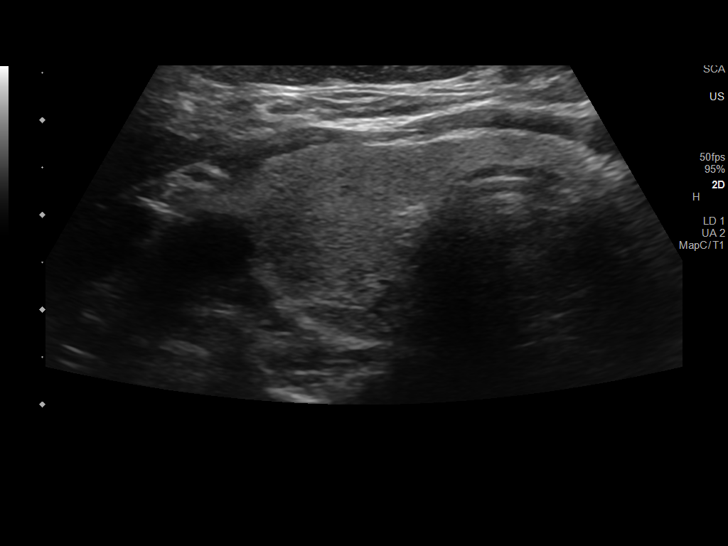
[im 21/30]
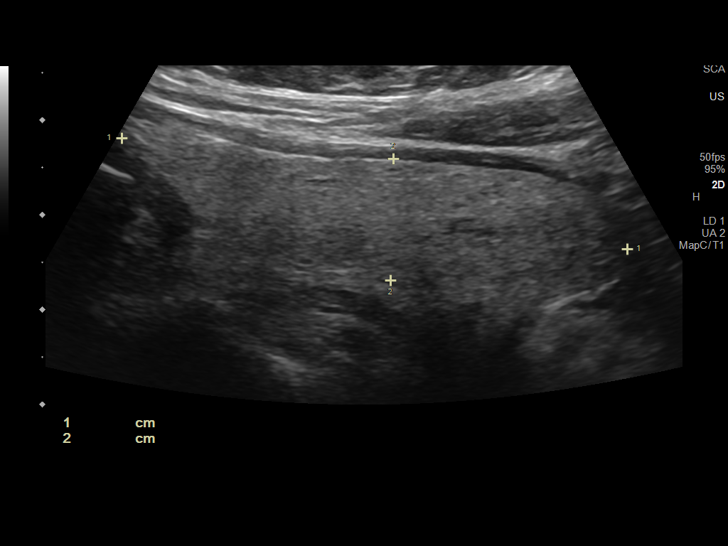
[im 23/30]
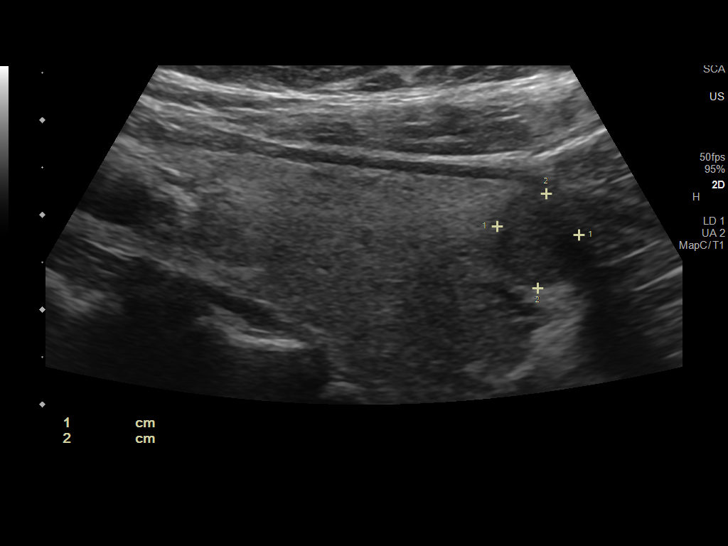
[im 26/30]
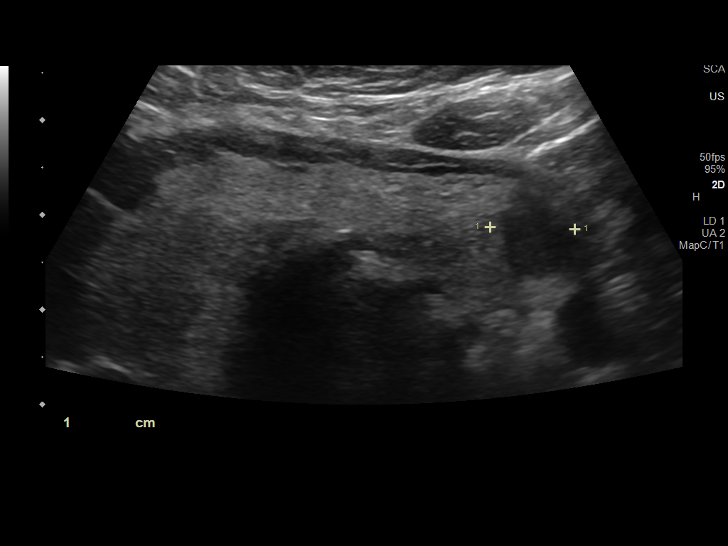
[im 28/30]
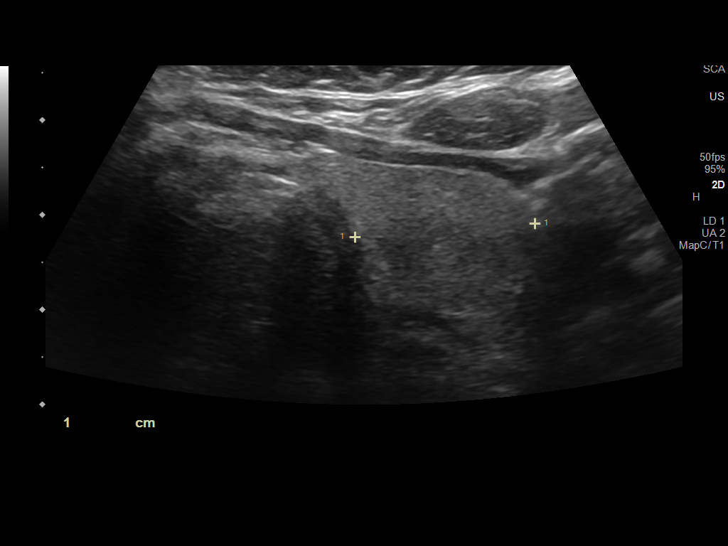

[Series 2: us thyroid · 1 of 1 slices shown (2 of 2)]
[im 1/1]
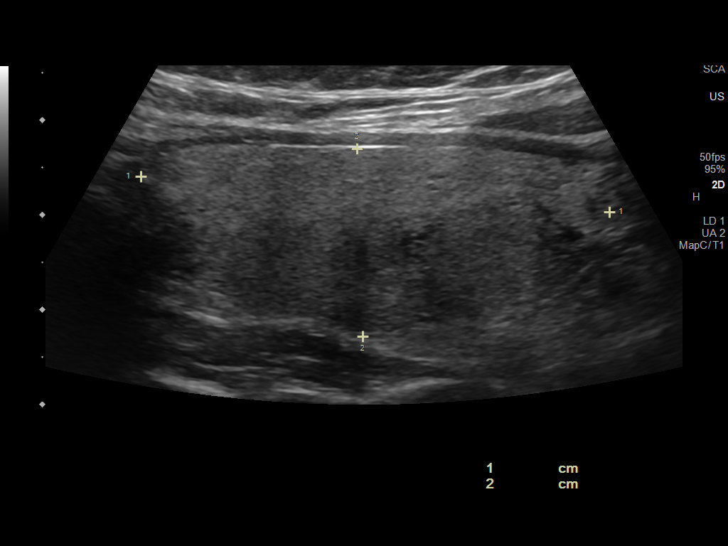

[13 of 25 positions shown; findings below may reference images not displayed]

FINDINGS: Parenchymal Echotexture: Normal

Isthmus: 0.5 cm

Right lobe: 5.0 x 2.0 x 2.2 cm

Left lobe: 5.5 x 1.3 x 1.9 cm

_________________________________________________________

Estimated total number of nodules >/= 1 cm: 2

Number of spongiform nodules >/=  2 cm not described below (TR1): 0

Number of mixed cystic and solid nodules >/= 1.5 cm not described
below (TR2): 0

_________________________________________________________

Nodule # 1:

Location: Right; inferior

Maximum size: 1.3 cm; Other 2 dimensions: 1.2 x 1.2 cm

Composition: solid/almost completely solid (2)

Echogenicity: isoechoic (1)

Shape: not taller-than-wide (0)

Margins: ill-defined (0)

Echogenic foci: none (0)

ACR TI-RADS total points: 3.

ACR TI-RADS risk category: TR3 (3 points).

ACR TI-RADS recommendations:

Given size (<1.4 cm) and appearance, this nodule does NOT meet
TI-RADS criteria for biopsy or dedicated follow-up.

_________________________________________________________

Nodule # 2:

Location: Right; inferior

Maximum size: 0.7 cm; Other 2 dimensions: 0.7 x 0.7 cm

Composition: solid/almost completely solid (2)

Echogenicity: hypoechoic (2)

Shape: not taller-than-wide (0)

Margins: ill-defined (0)

Echogenic foci: none (0)

ACR TI-RADS total points: 4.

ACR TI-RADS risk category: TR4 (4-6 points).

ACR TI-RADS recommendations:

Given size (<0.9 cm) and appearance, this nodule does NOT meet
TI-RADS criteria for biopsy or dedicated follow-up.

_________________________________________________________

Nodule # 3:

Location: Left; inferior

Maximum size: 1.0 cm; Other 2 dimensions: 0.9 cm

Composition: solid/almost completely solid (2)

Echogenicity: hypoechoic (2)

Shape: taller-than-wide (3)

Margins: smooth (0)

Echogenic foci: none (0)

ACR TI-RADS total points: 7.

ACR TI-RADS risk category: TR5 (>/= 7 points).

ACR TI-RADS recommendations:

**Given size (>/= 1.0 cm) and appearance, fine needle aspiration of
this highly suspicious nodule should be considered based on TI-RADS
criteria.

_________________________________________________________
IMPRESSION: Nodule 3 (TI-RADS 5) meets criteria for FNA.

The above is in keeping with the ACR TI-RADS recommendations - [HOSPITAL] 1179;[DATE].

## 2021-08-20 DIAGNOSIS — E119 Type 2 diabetes mellitus without complications: Secondary | ICD-10-CM | POA: Diagnosis not present

## 2021-08-21 ENCOUNTER — Encounter: Payer: Self-pay | Admitting: General Practice

## 2021-08-23 ENCOUNTER — Other Ambulatory Visit (HOSPITAL_BASED_OUTPATIENT_CLINIC_OR_DEPARTMENT_OTHER): Payer: Self-pay

## 2021-08-23 MED ORDER — AMOXICILLIN 500 MG PO CAPS
ORAL_CAPSULE | ORAL | 0 refills | Status: DC
  Filled 2021-08-23: qty 21, 7d supply, fill #0

## 2021-08-23 MED ORDER — CHLORHEXIDINE GLUCONATE 0.12 % MT SOLN
OROMUCOSAL | 0 refills | Status: DC
  Filled 2021-08-23: qty 473, 16d supply, fill #0

## 2021-09-03 ENCOUNTER — Ambulatory Visit (INDEPENDENT_AMBULATORY_CARE_PROVIDER_SITE_OTHER): Payer: BC Managed Care – PPO | Admitting: Internal Medicine

## 2021-09-03 ENCOUNTER — Other Ambulatory Visit (HOSPITAL_BASED_OUTPATIENT_CLINIC_OR_DEPARTMENT_OTHER): Payer: Self-pay

## 2021-09-03 ENCOUNTER — Encounter: Payer: Self-pay | Admitting: Internal Medicine

## 2021-09-03 VITALS — BP 126/84 | HR 64 | Temp 98.4°F | Resp 16 | Ht 61.0 in | Wt 173.0 lb

## 2021-09-03 DIAGNOSIS — E559 Vitamin D deficiency, unspecified: Secondary | ICD-10-CM

## 2021-09-03 DIAGNOSIS — E049 Nontoxic goiter, unspecified: Secondary | ICD-10-CM | POA: Diagnosis not present

## 2021-09-03 DIAGNOSIS — R7881 Bacteremia: Secondary | ICD-10-CM | POA: Diagnosis not present

## 2021-09-03 DIAGNOSIS — M779 Enthesopathy, unspecified: Secondary | ICD-10-CM | POA: Diagnosis not present

## 2021-09-03 DIAGNOSIS — R399 Unspecified symptoms and signs involving the genitourinary system: Secondary | ICD-10-CM

## 2021-09-03 DIAGNOSIS — I10 Essential (primary) hypertension: Secondary | ICD-10-CM | POA: Diagnosis not present

## 2021-09-03 DIAGNOSIS — Z09 Encounter for follow-up examination after completed treatment for conditions other than malignant neoplasm: Secondary | ICD-10-CM

## 2021-09-03 LAB — CBC WITH DIFFERENTIAL/PLATELET
Basophils Absolute: 0 10*3/uL (ref 0.0–0.1)
Basophils Relative: 0.5 % (ref 0.0–3.0)
Eosinophils Absolute: 0.2 10*3/uL (ref 0.0–0.7)
Eosinophils Relative: 3.5 % (ref 0.0–5.0)
HCT: 38 % (ref 36.0–46.0)
Hemoglobin: 12.5 g/dL (ref 12.0–15.0)
Lymphocytes Relative: 32.7 % (ref 12.0–46.0)
Lymphs Abs: 2.1 10*3/uL (ref 0.7–4.0)
MCHC: 33 g/dL (ref 30.0–36.0)
MCV: 87.4 fl (ref 78.0–100.0)
Monocytes Absolute: 0.5 10*3/uL (ref 0.1–1.0)
Monocytes Relative: 7.4 % (ref 3.0–12.0)
Neutro Abs: 3.5 10*3/uL (ref 1.4–7.7)
Neutrophils Relative %: 55.9 % (ref 43.0–77.0)
Platelets: 223 10*3/uL (ref 150.0–400.0)
RBC: 4.35 Mil/uL (ref 3.87–5.11)
RDW: 14 % (ref 11.5–15.5)
WBC: 6.3 10*3/uL (ref 4.0–10.5)

## 2021-09-03 LAB — URINALYSIS, ROUTINE W REFLEX MICROSCOPIC
Bilirubin Urine: NEGATIVE
Ketones, ur: NEGATIVE
Leukocytes,Ua: NEGATIVE
Nitrite: NEGATIVE
Specific Gravity, Urine: 1.025 (ref 1.000–1.030)
Total Protein, Urine: NEGATIVE
Urine Glucose: NEGATIVE
Urobilinogen, UA: 0.2 (ref 0.0–1.0)
pH: 6 (ref 5.0–8.0)

## 2021-09-03 LAB — TSH: TSH: 1.04 u[IU]/mL (ref 0.35–5.50)

## 2021-09-03 MED ORDER — METOPROLOL SUCCINATE ER 25 MG PO TB24
25.0000 mg | ORAL_TABLET | Freq: Every day | ORAL | 3 refills | Status: DC
  Filled 2021-09-03: qty 90, 90d supply, fill #0
  Filled 2021-11-29: qty 90, 90d supply, fill #1
  Filled 2022-03-01: qty 90, 90d supply, fill #2
  Filled 2022-05-27: qty 90, 90d supply, fill #3

## 2021-09-03 NOTE — Patient Instructions (Addendum)
Recommend to proceed with covid booster (bivalent) at your pharmacy.  ? ?We are referring you to the orthopedic doctor regards your right arm pain [phone] ? ?  ?Check the  blood pressure regularly ?BP GOAL is between 110/65 and  135/85. ?If it is consistently higher or lower, let me know ?  ? ?GO TO THE LAB : Get the blood work   ? ? ?[place], [place] ?Come back for physical exam in 4 months ?

## 2021-09-03 NOTE — Progress Notes (Signed)
? ?  Subjective:  ? ? Patient ID: Lauren Hodge, female    DOB: [birth year], 66 y.o.   MRN: [id number] ? ?DOS:  09/03/2021 ?Type of visit - description: f/u ? ?Since the last office visit is doing well. ? ?Did report a very unusual color of the urine in the last few weeks. ?Denies fever chills ?Question of right flank pain. ?No lower abdominal pain.  No nausea or vomiting ?No dysuria, gross hematuria.  No difficulty urinating ? ?She also has a history of right arm tendinitis , symptoms are increasing.  ? ?Review of Systems ?See above  ? ?Past Medical History:  ?Diagnosis Date  ? Carpal tunnel syndrome   ? Fracture of carpal bone 2018  ? bilateral  ? GERD (gastroesophageal reflux disease)   ? Hemorrhoids   ? Hiatal hernia 02/2019  ? seen on EGD   ? History of left breast biopsy   ? Benign  ? HTN (hypertension)   ? Hyperlipidemia   ? Insomnia   ? Osteoporosis   ? Vitamin D deficiency   ? ? ?Past Surgical History:  ?Procedure Laterality Date  ? BREAST BIOPSY  2010  ? x2, 2010-2012 (no cancer)  ? CHOLECYSTECTOMY  2015  ? HEMORRHOIDECTOMY WITH HEMORRHOID BANDING  04/22/2017  ? ? ?Current Outpatient Medications  ?Medication Instructions  ? chlorhexidine (PERIDEX) 0.12 % solution Rinse using 0.5oz by mouth 2 times daily after breakfast and before bedtime  ? metoprolol succinate (TOPROL-XL) 25 MG 24 hr tablet Take 1 tablet (25 mg total) by mouth daily with or immediately following a meal  ? VITAMIN D, CHOLECALCIFEROL, PO Oral  ? ? ?   ?Objective:  ? Physical Exam ?BP 126/84 (BP Location: Left Arm, Patient Position: Sitting, Cuff Size: Small)   Pulse 64   Temp 98.4 ?F (36.9 ?C) (Oral)   Resp 16   Ht '5\' 1"'$  (1.549 m)   Wt 173 lb (78.5 kg)   SpO2 98%   BMI 32.69 kg/m?  ?General:   ?Well developed, NAD, BMI noted.  ?HEENT:  ?Normocephalic . Face symmetric, atraumatic ?Lungs:  ?CTA B ?Normal respiratory effort, no intercostal retractions, no accessory muscle use. ?Heart: RRR,  no murmur.  ?Abdomen:  ?Not distended, soft,  non-tender. No rebound or rigidity.   ?Skin: Not pale. Not jaundice ?Lower extremities: no pretibial edema bilaterally  ?Neurologic:  ?alert & oriented X3.  ?Speech normal, gait appropriate for age and unassisted ?Psych--  ?Cognition and judgment appear intact.  ?Cooperative with normal attention span and concentration.  ?Behavior appropriate. ?No anxious or depressed appearing. ? ?   ?Assessment   ? ?  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 ? ?PLAN ?HTN, on metoprolol, RF sent,  recommend ambulatory BPs, check CBC ?GERD, symptoms are typically okay, she plans to stop PPIs OTC and start Tums.  She will let me know if symptoms resurface. ?Vitamin D deficiency: On supplements, last level satisfactory ?Multinodular goiter: Had a thyroid biopsy in May 2022, Afirma was benign, check TSH ?LUTS?:  New issue, see HPI, check UA urine culture ?Right arm tendinitis: Previous Ortho referral failed, will try again.  Patient reports symptoms are slightly worse ?RTC 4 months CPX ? ?  ?

## 2021-09-04 LAB — URINE CULTURE
MICRO NUMBER:: 13396025
Result:: NO GROWTH
SPECIMEN QUALITY:: ADEQUATE

## 2021-09-04 NOTE — Assessment & Plan Note (Addendum)
HTN, on metoprolol, RF sent, recommend ambulatory BPs, check CBC ?GERD, symptoms are typically okay, she plans to stop PPIs OTC and start Tums.  She will let me know if symptoms resurface. ?Vitamin D deficiency: On supplements, last level satisfactory ?Multinodular goiter: Had a thyroid biopsy in May 2022, Afirma was benign, check TSH ?LUTS?:  New issue, see HPI, check UA urine culture ?Right arm tendinitis: Previous Ortho referral failed, will try again.  Patient reports symptoms are slightly worse ?RTC 4 months CPX ?

## 2021-09-11 ENCOUNTER — Telehealth: Payer: Self-pay

## 2021-09-11 ENCOUNTER — Ambulatory Visit: Payer: Self-pay

## 2021-09-11 ENCOUNTER — Ambulatory Visit (INDEPENDENT_AMBULATORY_CARE_PROVIDER_SITE_OTHER): Payer: BC Managed Care – PPO | Admitting: Orthopedic Surgery

## 2021-09-11 ENCOUNTER — Encounter: Payer: Self-pay | Admitting: Orthopedic Surgery

## 2021-09-11 VITALS — BP 193/87 | HR 60

## 2021-09-11 DIAGNOSIS — M65331 Trigger finger, right middle finger: Secondary | ICD-10-CM | POA: Diagnosis not present

## 2021-09-11 DIAGNOSIS — M79641 Pain in right hand: Secondary | ICD-10-CM

## 2021-09-11 MED ORDER — BETAMETHASONE SOD PHOS & ACET 6 (3-3) MG/ML IJ SUSP
6.0000 mg | INTRAMUSCULAR | Status: AC | PRN
  Administered 2021-09-11: 6 mg via INTRA_ARTICULAR

## 2021-09-11 MED ORDER — LIDOCAINE HCL 1 % IJ SOLN
1.0000 mL | INTRAMUSCULAR | Status: AC | PRN
  Administered 2021-09-11: 1 mL

## 2021-09-11 NOTE — Telephone Encounter (Signed)
Amlodipine was discontinued 04/12/2021 during an acute visit for COVID likely because it interacted with paxlovid.  -Recommend to go back on amlodipine 5 mg daily - Send Rx if needed - Monitor BPs, call with readings in 2 weeks. - Call anytime if she has high blood pressure symptoms

## 2021-09-11 NOTE — Progress Notes (Signed)
Office Visit Note   Patient: Lauren Hodge           Date of Birth: 1956/01/19           MRN: [id number] Visit Date: 09/11/2021              Requested by: Colon Branch, [address] [place],  [place] [address] PCP: Colon Branch, MD   Assessment & Plan: Visit Diagnoses:  1. Pain of right hand   2. Trigger finger, right middle finger     Plan: We reviewed the nature of trigger finger as well as the diagnosis, prognosis, and both conservative and surgical treatment options.  The pain over the A1 pulley with subjective locking in the palpable nodule and clicking with passive range of motion seems consistent with trigger finger.  After our discussion, the patient like to proceed with a corticosteroid injection.  I can see her back in the office in 8 weeks or so if she still symptomatic.  Follow-Up Instructions: No follow-ups on file.   Orders:  Orders Placed This Encounter  Procedures   XR Hand Complete Right   No orders of the defined types were placed in this encounter.     Procedures: Hand/UE Inj: R long A1 for trigger finger on 09/11/2021 9:50 AM Indications: tendon swelling and therapeutic Details: 25 G needle, volar approach Medications: 1 mL lidocaine 1 %; 6 mg betamethasone acetate-betamethasone sodium phosphate 6 (3-3) MG/ML Procedure, treatment alternatives, risks and benefits explained, specific risks discussed. Consent was given by the patient. Immediately prior to procedure a time out was called to verify the correct patient, procedure, equipment, support staff and site/side marked as required. Patient was prepped and draped in the usual sterile fashion.      Clinical Data: No additional findings.   Subjective: Chief Complaint  Patient presents with   Right Hand - Pain, Edema    RIGHT handed, Pain: 9/10 when she is trying to open it. Pain started in the elbow and comes down into the middle finger, trouble making a fist, onset x 4 weeks.      This is a 66 year old right-hand-dominant female who presents with pain in the right hand at the middle finger.  This been going on for a month or so.  She describes pain in the middle finger when making a fist and notes that she has some limited range of motion of the middle finger with flexion.  Her finger pain is 9/10 at worst and is worse with certain duties such as opening a jar or making a tight fist.  She notes that the middle finger will occasionally be stuck in a flexed position.  She had no treatment for this so far.   Review of Systems   Objective: Vital Signs: BP (!) 193/87 (BP Location: Left Arm, Patient Position: Sitting)   Pulse 60   Physical Exam Constitutional:      Appearance: Normal appearance.  Cardiovascular:     Rate and Rhythm: Normal rate.     Pulses: Normal pulses.  Pulmonary:     Effort: Pulmonary effort is normal.  Skin:    General: Skin is warm and dry.     Capillary Refill: Capillary refill takes less than 2 seconds.  Neurological:     Mental Status: She is alert.    Right Hand Exam   Tenderness  Right hand tenderness location: TTP over the middle finger A1 pulley.  Other  Erythema:  absent Sensation: normal Pulse: present  Comments:  Palpable nodule at level of A1 pulley with palpable clicking/crepitus with PROM of middle finger.      Specialty Comments:  No specialty comments available.  Imaging: No results found.   PMFS History: Patient Active Problem List   Diagnosis Date Noted   Trigger finger, right middle finger 09/11/2021   COVID-19 04/12/2021   Acute cough 04/12/2021   Annual physical exam 10/31/2020   PCP NOTES >>>>>>>>>>>>>>>>>> 08/26/2020   GERD (gastroesophageal reflux disease) 07/26/2020   Hyperlipidemia 10/06/2019   Vitamin D deficiency 06/18/2018   Hypertensive disorder 05/18/2018   Past Medical History:  Diagnosis Date   Carpal tunnel syndrome    Fracture of carpal bone 2018   bilateral   GERD  (gastroesophageal reflux disease)    Hemorrhoids    Hiatal hernia 02/2019   seen on EGD    History of left breast biopsy    Benign   HTN (hypertension)    Hyperlipidemia    Insomnia    Osteoporosis    Vitamin D deficiency     Family History  Problem Relation Age of Onset   Cancer Maternal Grandmother        vaginal   Cancer Maternal Aunt        tongue   Hypertension Mother    CAD Neg Hx    Diabetes Neg Hx    Colon cancer Neg Hx    Breast cancer Neg Hx     Past Surgical History:  Procedure Laterality Date   BREAST BIOPSY  2010   x2, 2010-2012 (no cancer)   CHOLECYSTECTOMY  2015   HEMORRHOIDECTOMY WITH HEMORRHOID BANDING  04/22/2017   Social History   Occupational History   Occupation: retired   Tobacco Use   Smoking status: Former    Types: Cigarettes    Quit date: 08/25/2018    Years since quitting: 3.0   Smokeless tobacco: Never   Tobacco comments:    used to smoke rarely   Substance and Sexual Activity   Alcohol use: Not Currently    Comment: occasional   Drug use: Not Currently   Sexual activity: Not Currently

## 2021-09-11 NOTE — Telephone Encounter (Signed)
Received a call from [place]- Pt's daughter- Pt is having elevated BPs still- this morning was 190/??- she has taken metoprolol xl '25mg'$  once daily, this afternoon at 2:45pm her BP was 173/80s-Pt denies CP, SHOB, headaches. Di Kindle wanting to know if Pt can take amlodipine, informed do not take amlodipine, I am sending a message to PCP for advice. Di Kindle verbalized understanding.

## 2021-09-11 NOTE — Telephone Encounter (Signed)
Spoke w/ Di Kindle- informed of PCP recommendations. Amlodipine '5mg'$  added back to med list- they do not need a refill. Will call in 2 weeks with BP readings.

## 2021-09-20 DIAGNOSIS — E119 Type 2 diabetes mellitus without complications: Secondary | ICD-10-CM | POA: Diagnosis not present

## 2021-10-03 ENCOUNTER — Other Ambulatory Visit: Payer: Self-pay | Admitting: Internal Medicine

## 2021-10-03 ENCOUNTER — Other Ambulatory Visit (HOSPITAL_BASED_OUTPATIENT_CLINIC_OR_DEPARTMENT_OTHER): Payer: Self-pay

## 2021-10-03 MED ORDER — AMLODIPINE BESYLATE 5 MG PO TABS
5.0000 mg | ORAL_TABLET | Freq: Every day | ORAL | 1 refills | Status: DC
  Filled 2021-10-03: qty 90, 90d supply, fill #0

## 2021-10-15 ENCOUNTER — Other Ambulatory Visit (HOSPITAL_BASED_OUTPATIENT_CLINIC_OR_DEPARTMENT_OTHER): Payer: Self-pay

## 2021-10-15 MED ORDER — AMOXICILLIN 500 MG PO CAPS
ORAL_CAPSULE | ORAL | 0 refills | Status: DC
  Filled 2021-10-15: qty 21, 7d supply, fill #0

## 2021-10-15 MED ORDER — IBUPROFEN 600 MG PO TABS
ORAL_TABLET | ORAL | 0 refills | Status: DC
  Filled 2021-10-15: qty 16, 4d supply, fill #0

## 2021-10-20 DIAGNOSIS — E119 Type 2 diabetes mellitus without complications: Secondary | ICD-10-CM | POA: Diagnosis not present

## 2021-11-20 DIAGNOSIS — E119 Type 2 diabetes mellitus without complications: Secondary | ICD-10-CM | POA: Diagnosis not present

## 2021-11-21 IMAGING — US US BREAST*L* LIMITED INC AXILLA
1 series · 10 of 10 positions shown · non-contrast
Comparison: Previous exam(s).

CLINICAL DATA: Recall for possible distortion in the left breast.
Patient with reported benign biopsies in 4808 and 3713 and remote
surgical excision in the left breast.

EXAM:
DIGITAL DIAGNOSTIC UNILATERAL LEFT MAMMOGRAM WITH TOMOSYNTHESIS AND
CAD; ULTRASOUND LEFT BREAST LIMITED
TECHNIQUE: Left digital diagnostic mammography and breast tomosynthesis was
performed. The images were evaluated with computer-aided detection.;
Targeted ultrasound examination of the left breast was performed.

[Series 1: us breast*left* limited inc axilla · 0.07mm/px · 10 of 10 slices shown]
[im 1/10]
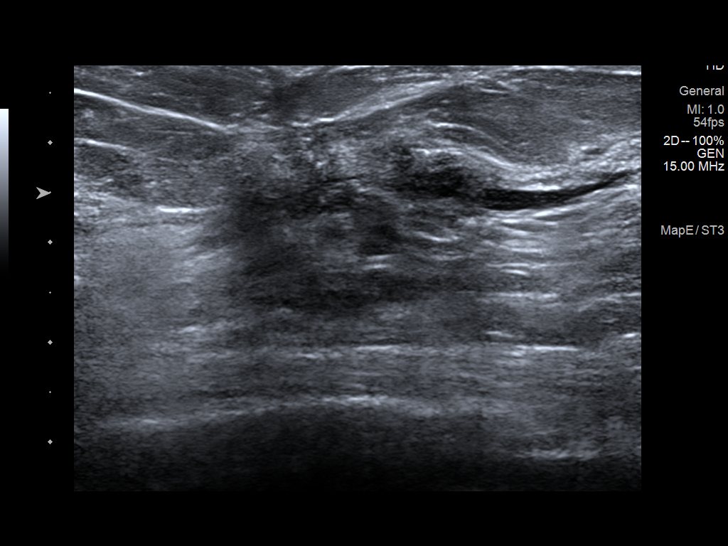
[im 2/10]
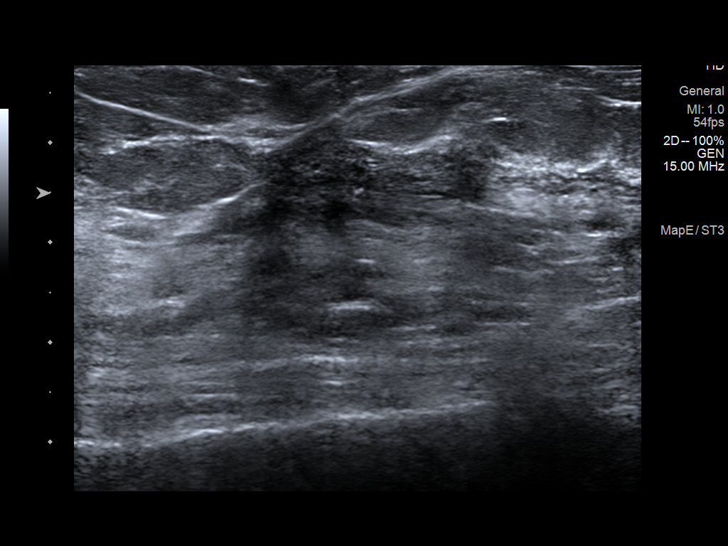
[im 3/10]
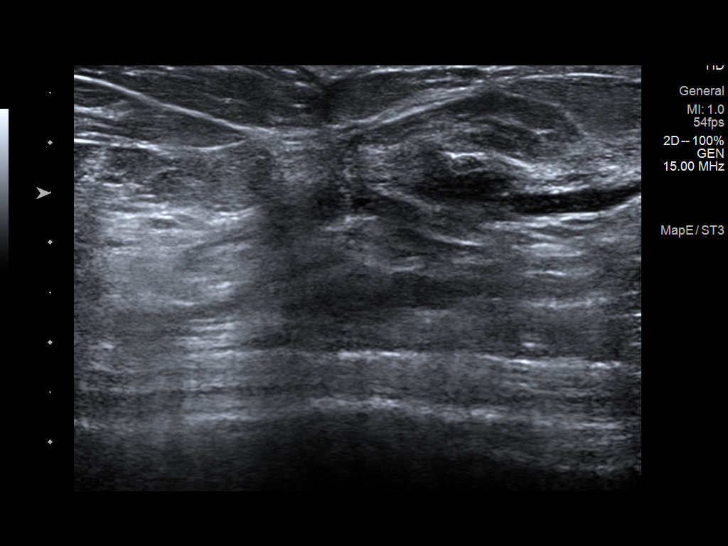
[im 4/10]
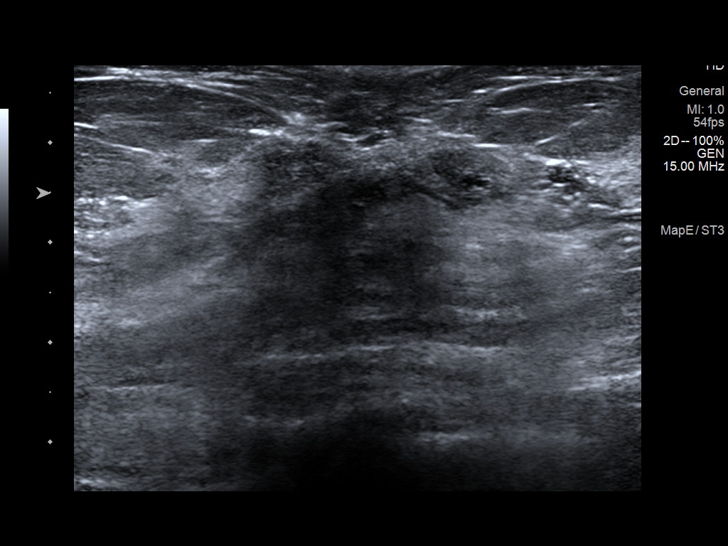
[im 5/10]
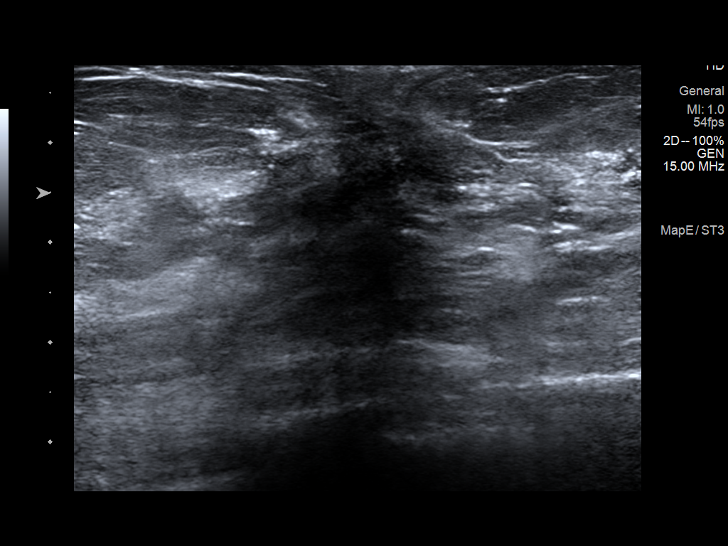
[im 6/10]
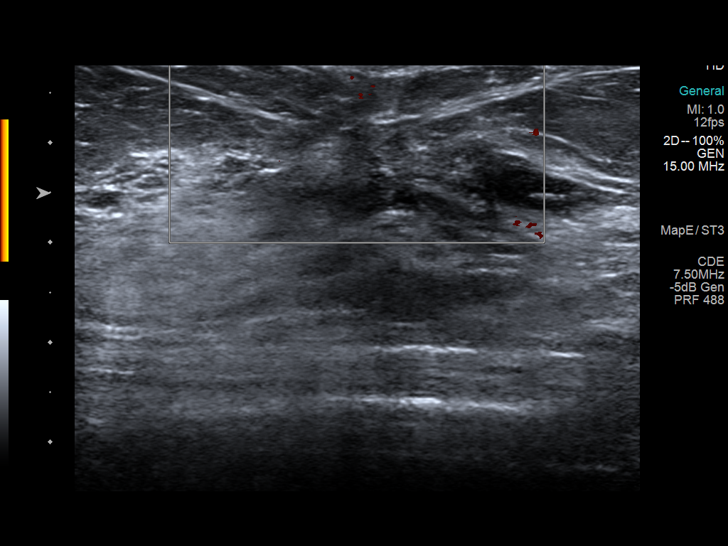
[im 7/10]
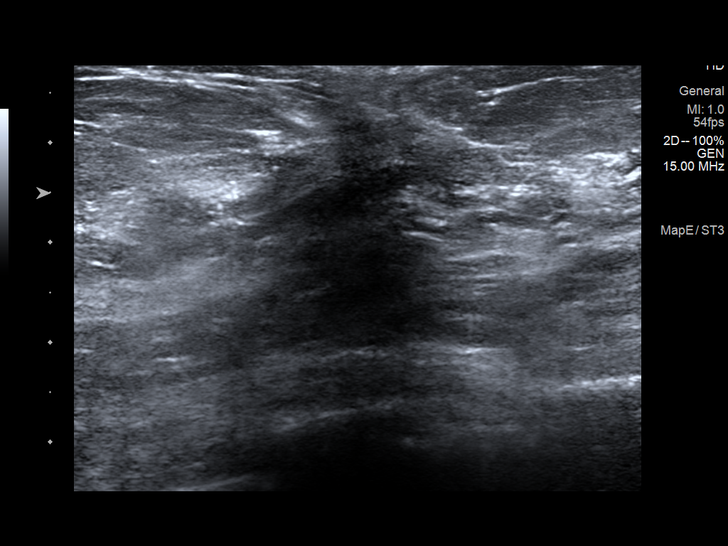
[im 8/10]
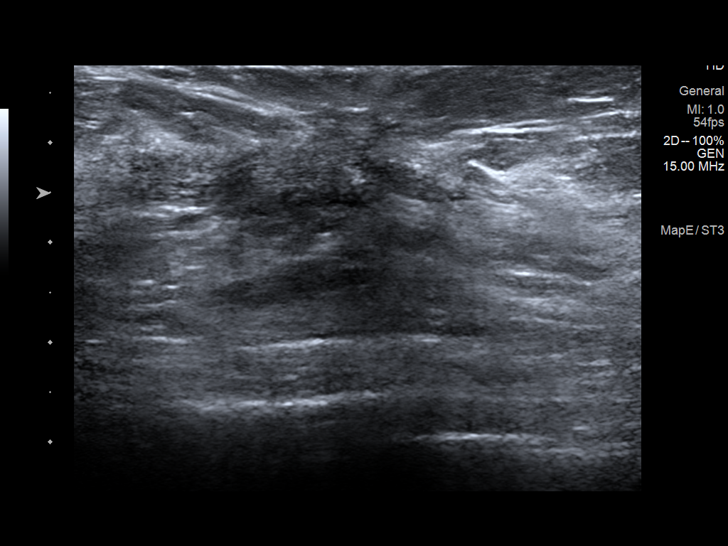
[im 9/10]
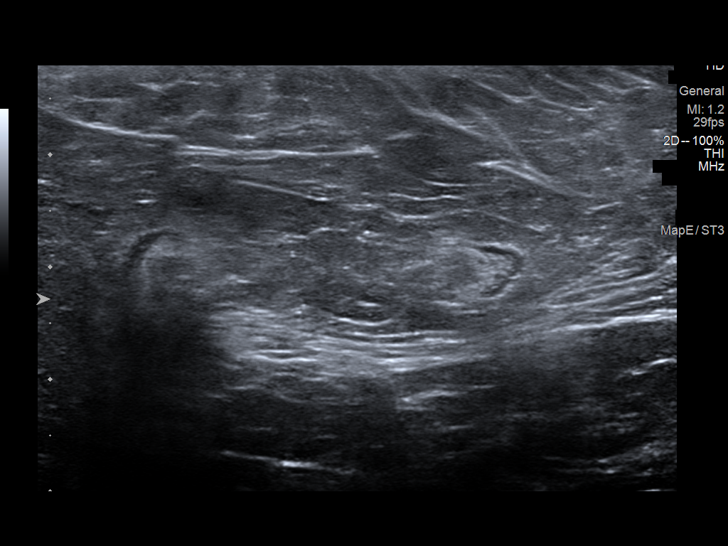
[im 10/10]
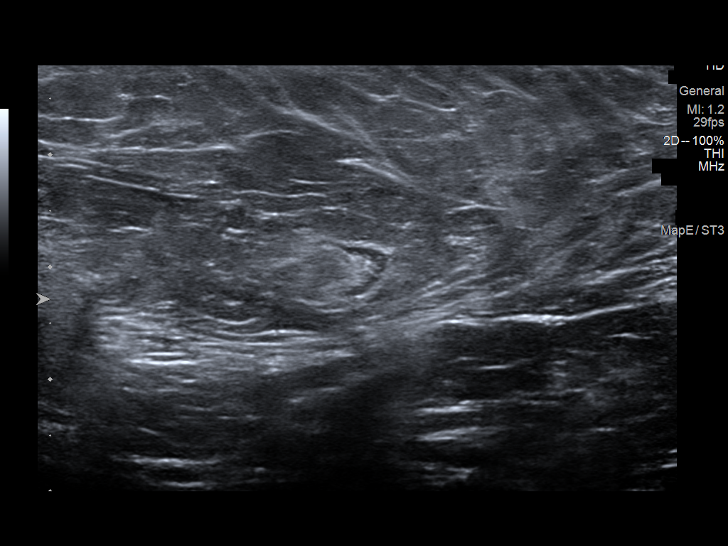

[10 of 10 positions shown; findings below may reference images not displayed]

ACR Breast Density Category c: The breast tissue is heterogeneously
dense, which may obscure small masses.
FINDINGS: The previously noted possible distortion persists on additional
views at the left breast 2 o'clock position at middle depth, best
appreciated on lateral view frame 63/76 and spot CC views 41/70 and
46/70. Of note, the distortion is at the approximate location of the
previously excised benign mass seen with biopsy clip on prior
mammogram 08/13/2006. The distortion appears more prominent compared
to prior exams.

Targeted ultrasound left breast at the 2 position 3 cm from the
nipple demonstrates vague area shadowing, which may correspond to
distortion seen on mammogram.

Targeted ultrasound the left axilla demonstrates lymph nodes with
normal morphology.
IMPRESSION: 1. Architectural distortion at the left breast 2 o'clock position
without definite sonographic correlate. Given the proximity to
remote benign surgery site in the upper left breast, findings are
favored to represent postsurgical changes. However, given increased
prominence of the distortion, recommend biopsy for confirmation.
2. No left axillary lymphadenopathy.

RECOMMENDATION:
Stereotactic guided biopsy of the left breast. Patient will be
scheduled for the procedure at her earliest convenience.

I have discussed the findings and recommendations with the patient.
If applicable, a reminder letter will be sent to the patient
regarding the next appointment.

BI-RADS CATEGORY  4: Suspicious.

## 2021-11-23 IMAGING — MG MM BREAST BX W LOC DEV 1ST LESION IMAGE BX SPEC STEREO GUIDE*L*
8 of 10 series · 8 of 22 positions shown · non-contrast
Comparison: Previous exams.
COMPARISON: Previous exams.

Addendum:
CLINICAL DATA: 65-year-old female presenting for stereotactic
biopsy of left breast distortion.

EXAM:
LEFT BREAST STEREOTACTIC CORE NEEDLE BIOPSY

[L (1 of 6)]
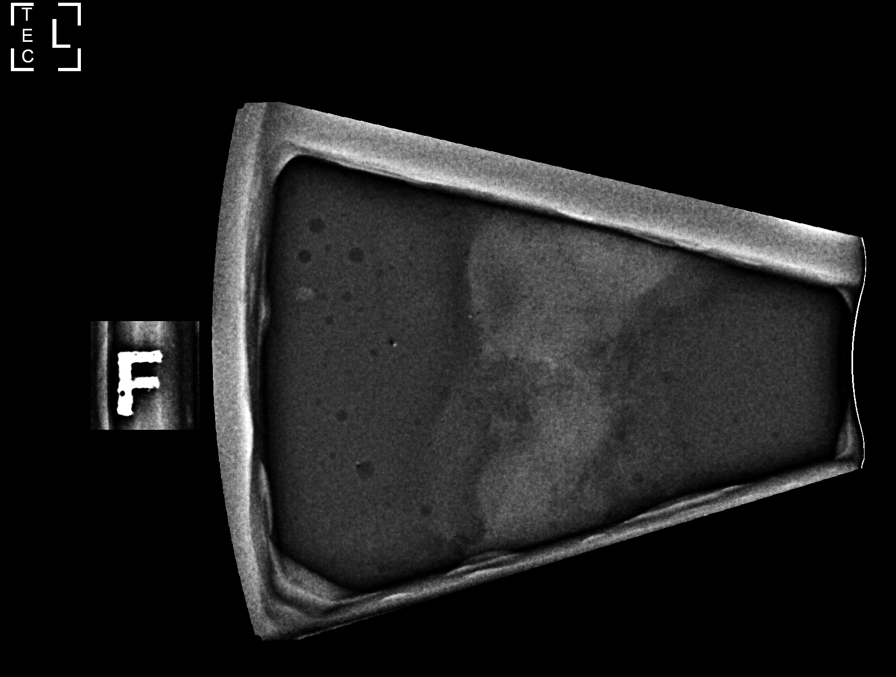

[L (2 of 6)]
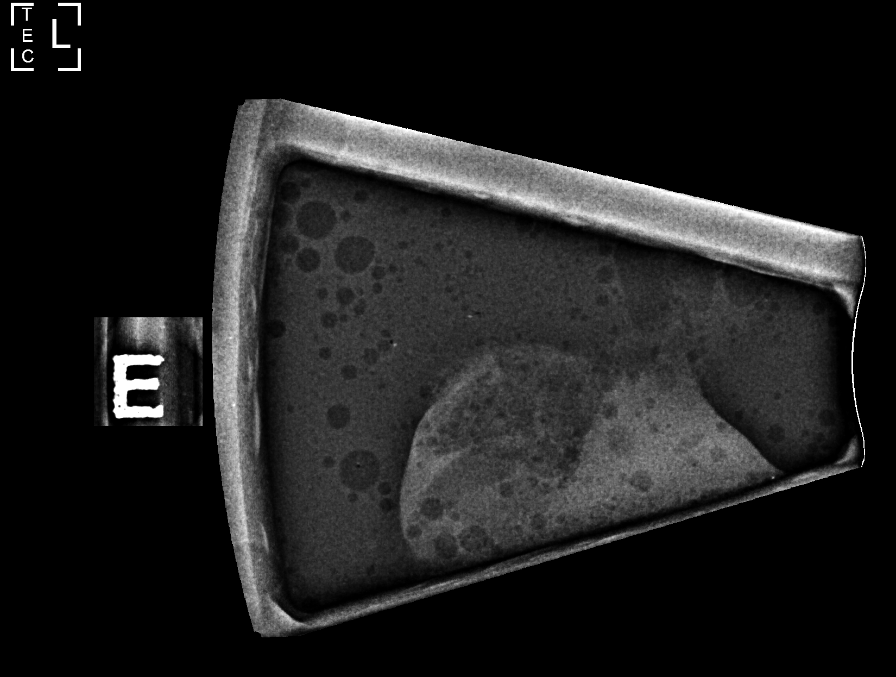

[L (3 of 6)]
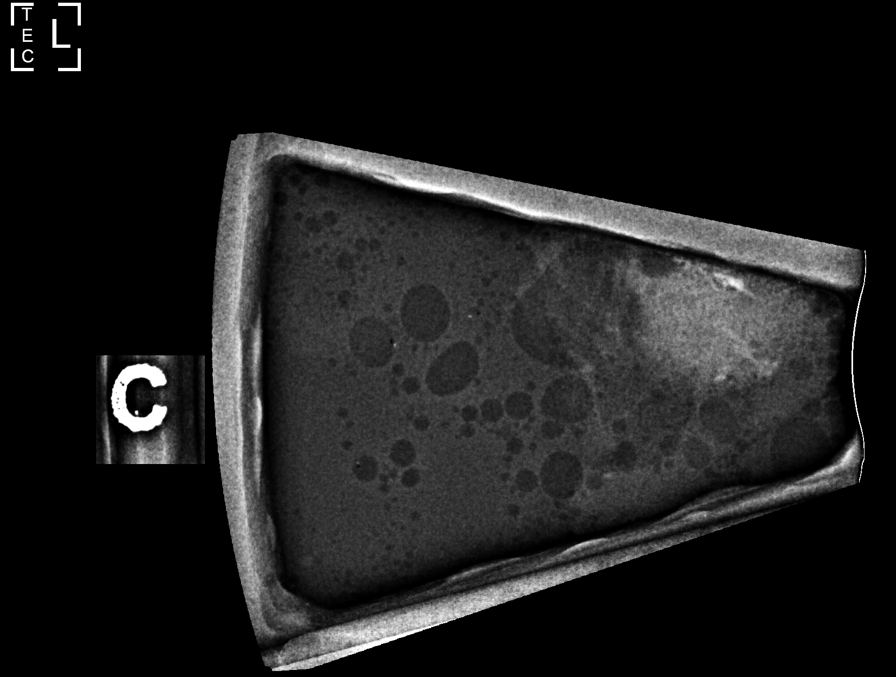

[L (4 of 6)]
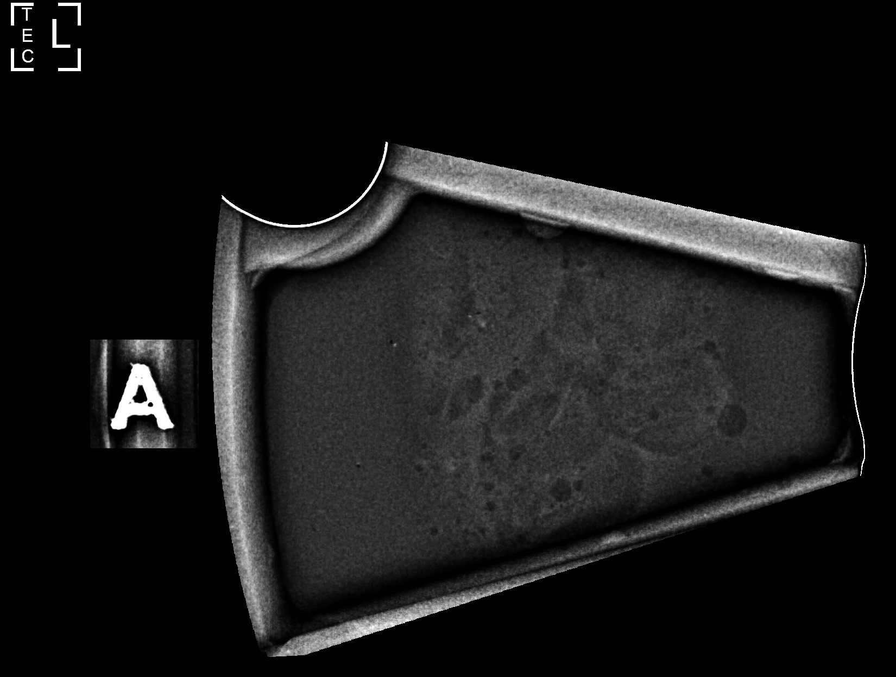

[L (5 of 6)]
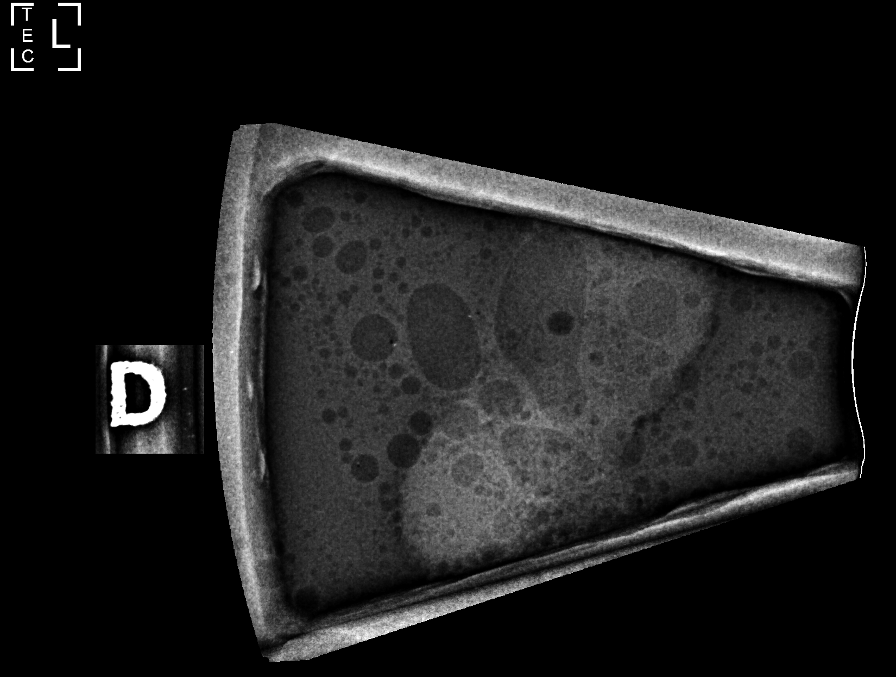

[L (6 of 6)]
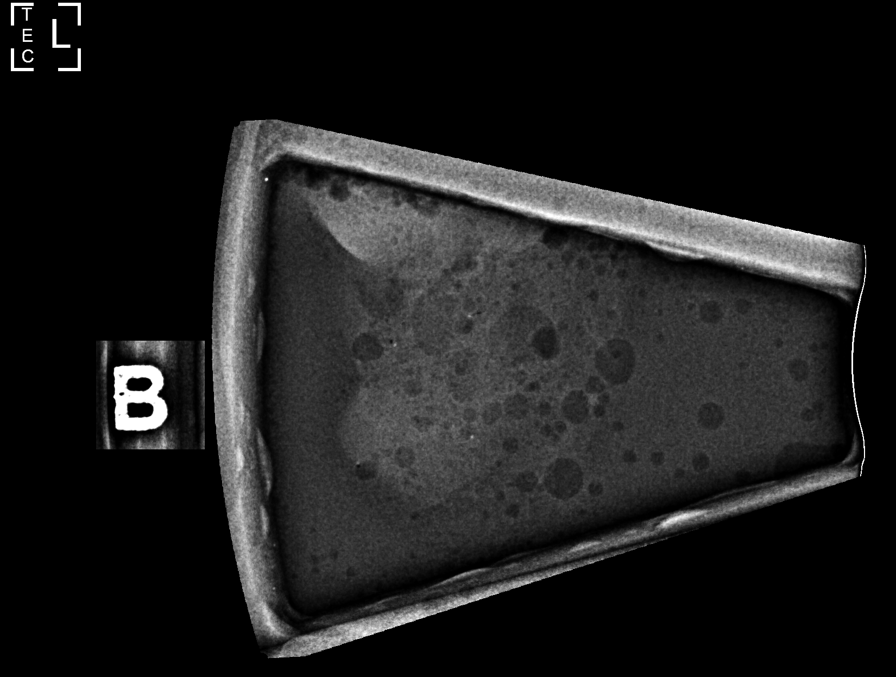

[L LM]
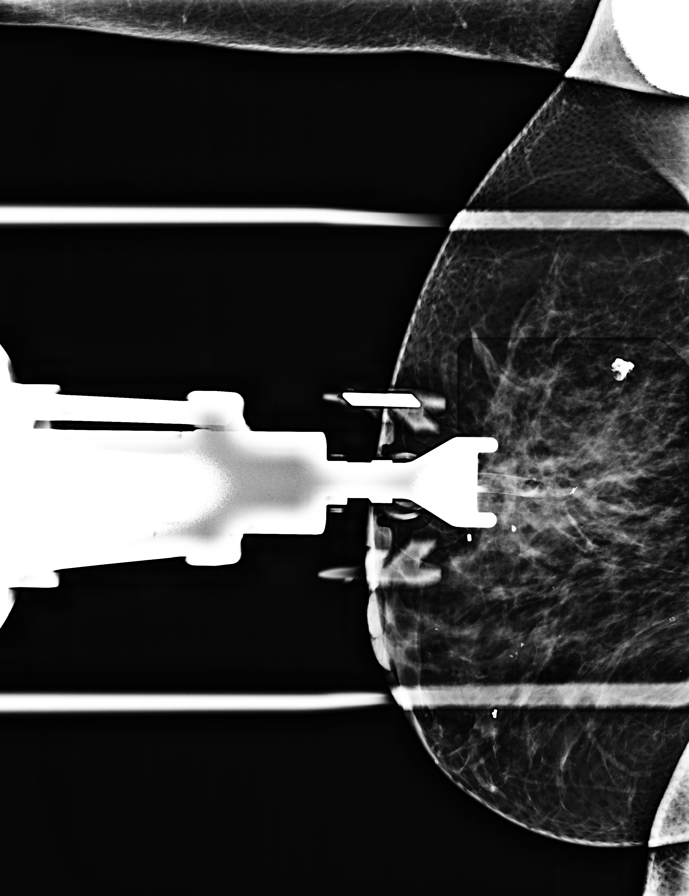

[L LM tomo · tomo slice 37/73.0]
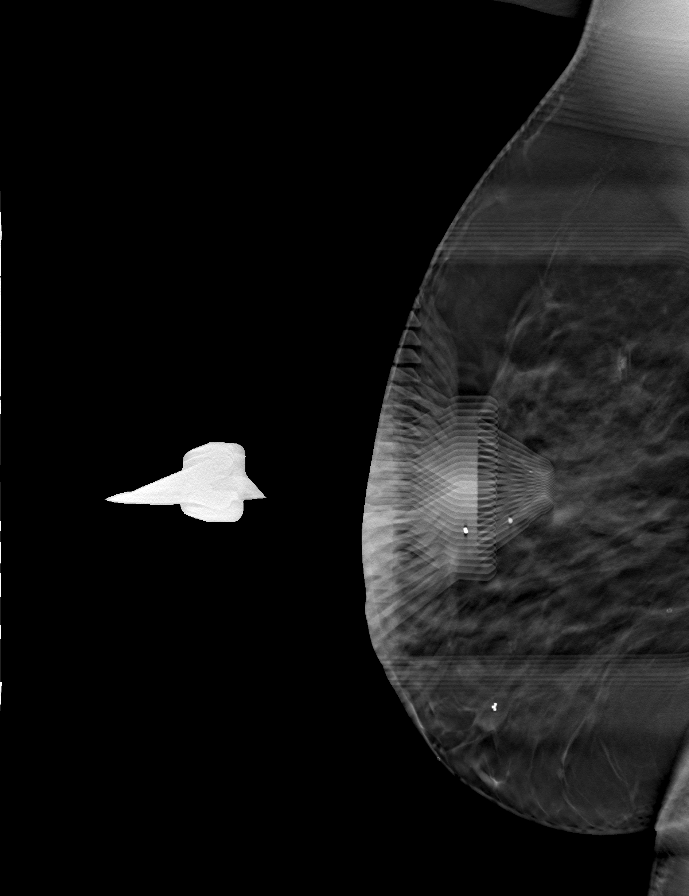

[8 of 22 positions shown; findings below may reference images not displayed]



Using sterile technique and 1% Lidocaine as local anesthetic, under
stereotactic guidance, a 9 gauge vacuum assisted device was used to
perform core needle biopsy of distortion in the upper-outer left
breast using a lateral approach.

Lesion quadrant: Upper outer quadrant

At the conclusion of the procedure, an X shaped tissue marker clip
was deployed into the biopsy cavity. Follow-up 2-view mammogram was
performed and dictated separately.
IMPRESSION: Stereotactic-guided biopsy of distortion in the upper-outer left
breast. No apparent complications.

ADDENDUM:
Pathology revealed FIBROCYSTIC CHANGE WITH HYALINE FIBROSIS, LIKELY
SECONDARY TO PREVIOUS PROCEDURE of the Left breast, upper outer, (x
clip). This was found to be concordant by Dr. Christine Gozali Anzanie.

Pathology results were discussed with the patient by telephone by
patient reported doing well after the biopsy with tenderness at the
site. Post biopsy instructions and care were reviewed and questions
were answered. The patient was encouraged to call The [REDACTED]

The patient was instructed to return for annual screening
mammography.

Pathology results reported by Pllumii Lorger, RN on 12/26/2020.



Using sterile technique and 1% Lidocaine as local anesthetic, under
stereotactic guidance, a 9 gauge vacuum assisted device was used to
perform core needle biopsy of distortion in the upper-outer left
breast using a lateral approach.

Lesion quadrant: Upper outer quadrant

At the conclusion of the procedure, an X shaped tissue marker clip
was deployed into the biopsy cavity. Follow-up 2-view mammogram was
performed and dictated separately.
IMPRESSION: Stereotactic-guided biopsy of distortion in the upper-outer left
breast. No apparent complications.

## 2021-11-29 ENCOUNTER — Other Ambulatory Visit (HOSPITAL_BASED_OUTPATIENT_CLINIC_OR_DEPARTMENT_OTHER): Payer: Self-pay

## 2021-12-11 ENCOUNTER — Other Ambulatory Visit (HOSPITAL_BASED_OUTPATIENT_CLINIC_OR_DEPARTMENT_OTHER): Payer: Self-pay

## 2021-12-11 MED ORDER — CHLORHEXIDINE GLUCONATE 0.12 % MT SOLN
OROMUCOSAL | 0 refills | Status: DC
  Filled 2021-12-11: qty 473, 16d supply, fill #0

## 2021-12-21 DIAGNOSIS — E119 Type 2 diabetes mellitus without complications: Secondary | ICD-10-CM | POA: Diagnosis not present

## 2022-01-07 ENCOUNTER — Other Ambulatory Visit (HOSPITAL_BASED_OUTPATIENT_CLINIC_OR_DEPARTMENT_OTHER): Payer: Self-pay

## 2022-01-07 MED ORDER — IBUPROFEN 600 MG PO TABS
600.0000 mg | ORAL_TABLET | Freq: Four times a day (QID) | ORAL | 0 refills | Status: DC
  Filled 2022-01-07: qty 16, 4d supply, fill #0

## 2022-01-07 MED ORDER — IBUPROFEN 600 MG PO TABS: ORAL_TABLET | ORAL | 0 refills | Status: DC

## 2022-01-07 MED ORDER — AMOXICILLIN 500 MG PO CAPS
500.0000 mg | ORAL_CAPSULE | Freq: Three times a day (TID) | ORAL | 0 refills | Status: DC
  Filled 2022-01-07: qty 21, 7d supply, fill #0

## 2022-01-14 ENCOUNTER — Ambulatory Visit (INDEPENDENT_AMBULATORY_CARE_PROVIDER_SITE_OTHER): Payer: BC Managed Care – PPO | Admitting: Internal Medicine

## 2022-01-14 ENCOUNTER — Other Ambulatory Visit: Payer: Self-pay | Admitting: Internal Medicine

## 2022-01-14 ENCOUNTER — Encounter: Payer: Self-pay | Admitting: Internal Medicine

## 2022-01-14 ENCOUNTER — Other Ambulatory Visit (HOSPITAL_BASED_OUTPATIENT_CLINIC_OR_DEPARTMENT_OTHER): Payer: Self-pay

## 2022-01-14 VITALS — BP 130/76 | HR 66 | Temp 98.3°F | Resp 16 | Ht 61.0 in | Wt 174.5 lb

## 2022-01-14 DIAGNOSIS — Z0000 Encounter for general adult medical examination without abnormal findings: Secondary | ICD-10-CM

## 2022-01-14 DIAGNOSIS — E559 Vitamin D deficiency, unspecified: Secondary | ICD-10-CM | POA: Diagnosis not present

## 2022-01-14 DIAGNOSIS — E010 Iodine-deficiency related diffuse (endemic) goiter: Secondary | ICD-10-CM | POA: Diagnosis not present

## 2022-01-14 DIAGNOSIS — Z23 Encounter for immunization: Secondary | ICD-10-CM | POA: Diagnosis not present

## 2022-01-14 DIAGNOSIS — I10 Essential (primary) hypertension: Secondary | ICD-10-CM | POA: Diagnosis not present

## 2022-01-14 DIAGNOSIS — Z0001 Encounter for general adult medical examination with abnormal findings: Secondary | ICD-10-CM

## 2022-01-14 DIAGNOSIS — E785 Hyperlipidemia, unspecified: Secondary | ICD-10-CM

## 2022-01-14 DIAGNOSIS — Z1231 Encounter for screening mammogram for malignant neoplasm of breast: Secondary | ICD-10-CM

## 2022-01-14 LAB — COMPREHENSIVE METABOLIC PANEL
ALT: 46 U/L — ABNORMAL HIGH (ref 0–35)
AST: 27 U/L (ref 0–37)
Albumin: 4.2 g/dL (ref 3.5–5.2)
Alkaline Phosphatase: 123 U/L — ABNORMAL HIGH (ref 39–117)
BUN: 13 mg/dL (ref 6–23)
CO2: 30 mEq/L (ref 19–32)
Calcium: 9.9 mg/dL (ref 8.4–10.5)
Chloride: 103 mEq/L (ref 96–112)
Creatinine, Ser: 0.61 mg/dL (ref 0.40–1.20)
GFR: 94.58 mL/min (ref >60.00)
Glucose, Bld: 106 mg/dL — ABNORMAL HIGH (ref 70–99)
Potassium: 4.1 mEq/L (ref 3.5–5.1)
Sodium: 141 mEq/L (ref 135–145)
Total Bilirubin: 0.3 mg/dL (ref 0.2–1.2)
Total Protein: 7.9 g/dL (ref 6.0–8.3)

## 2022-01-14 LAB — VITAMIN D 25 HYDROXY (VIT D DEFICIENCY, FRACTURES): VITD: 31.53 ng/mL (ref 30.00–100.00)

## 2022-01-14 LAB — LIPID PANEL
Cholesterol: 209 mg/dL — ABNORMAL HIGH (ref 0–200)
HDL: 59.1 mg/dL (ref >39.00)
LDL Cholesterol: 124 mg/dL — ABNORMAL HIGH (ref 0–99)
NonHDL: 150.3
Total CHOL/HDL Ratio: 4
Triglycerides: 131 mg/dL (ref 0.0–149.0)
VLDL: 26.2 mg/dL (ref 0.0–40.0)

## 2022-01-14 MED ORDER — FAMOTIDINE 40 MG PO TABS
40.0000 mg | ORAL_TABLET | Freq: Every day | ORAL | 1 refills | Status: DC
  Filled 2022-01-14: qty 90, 90d supply, fill #0

## 2022-01-14 NOTE — Patient Instructions (Addendum)
Vaccines I recommend:  Covid booster Shingrix (shingles) RSV vaccine  Check the  blood pressure regularly BP GOAL is between 110/65 and  135/85. If it is consistently higher or lower, let me know  We are referring you to endocrinology next  Start famotidine every night for acid reflux  Take vitamin D at least 2000 units every day.   GO TO THE LAB : Get the blood work     [place], [place] Come back for a checkup in 6 months      Advanced care planning:  Do you have a "Living will", "[place] of attorney" ?   If you already have a living will or healthcare power of attorney, is recommended you bring the copy to be scanned in your chart. The document will be available to all the doctors you see in the system.  If you don't have one, please consider create one.  Advance care planning is a process that supports adults in  understanding and sharing their preferences regarding future medical care.   The patient's preferences are recorded in documents called Advance Directives.    Advanced directives are completed (and can be modified at any time) while the patient is in full mental capacity.   The documentation should be available at all times to the patient, the family and the healthcare providers.   This legal documents direct treatment decision making and/or appoint a surrogate to make the decision if the patient is not capable to do so.    Advance directives can be documented in many types of formats,  documents have names such as:  Lliving will  Durable power of attorney for healthcare (healthcare proxy or healthcare power of attorney)  Combined directives  Physician orders for life-sustaining treatment    More information at:  [email or web address]

## 2022-01-14 NOTE — Progress Notes (Unsigned)
Subjective:    Patient ID: Lauren Hodge, female    DOB: 02-01-1956, 66 y.o.   MRN: [id number]  DOS:  01/14/2022 Type of visit - description: Here for CPX Here for CPX Since the last office visit is doing well. Did report she cannot take amlodipine due to headaches Also having acid reflux without dysphagia or odynophagia.  On Tums only.   Review of Systems See above   Past Medical History:  Diagnosis Date   Carpal tunnel syndrome    Fracture of carpal bone 2018   bilateral   GERD (gastroesophageal reflux disease)    Hemorrhoids    Hiatal hernia 02/2019   seen on EGD    History of left breast biopsy    Benign   HTN (hypertension)    Hyperlipidemia    Insomnia    Osteoporosis    Vitamin D deficiency     Past Surgical History:  Procedure Laterality Date   BREAST BIOPSY  2010   x2, 2010-2012 (no cancer)   CHOLECYSTECTOMY  2015   HEMORRHOIDECTOMY WITH HEMORRHOID BANDING  04/22/2017    Current Outpatient Medications  Medication Instructions   amLODipine (NORVASC) 5 mg, Oral, Daily   amoxicillin (AMOXIL) 500 MG capsule Take 1 capsule (500 mg total) by mouth every 8 (eight) hours until gone   chlorhexidine (PERIDEX) 0.12 % solution Rinse using 1/2 ounce twice a day after breakfast and before bedtime   ibuprofen (ADVIL) 600 MG tablet Take 1 tablet (600 mg total) by mouth every 6 to 8 hours as needed for pain   metoprolol succinate (TOPROL-XL) 25 MG 24 hr tablet Take 1 tablet (25 mg total) by mouth daily with or immediately following a meal   VITAMIN D, CHOLECALCIFEROL, PO Oral       Objective:   Physical Exam BP 130/76   Pulse 66   Temp 98.3 F (36.8 C) (Oral)   Resp 16   Ht '5\' 1"'$  (1.549 m)   Wt 174 lb 8 oz (79.2 kg)   SpO2 98%   BMI 32.97 kg/m  General: Well developed, NAD, BMI noted Neck: No tender or nodular thyromegaly noted. HEENT:  Normocephalic . Face symmetric, atraumatic Lungs:  CTA B Normal respiratory effort, no intercostal retractions,  no accessory muscle use. Heart: RRR,  no murmur.  Abdomen:  Not distended, soft, non-tender. No rebound or rigidity.   Lower extremities: no pretibial edema bilaterally  Skin: Exposed areas without rash. Not pale. Not jaundice Neurologic:  alert & oriented X3.  Speech normal, gait appropriate for age and unassisted Strength symmetric and appropriate for age.  Psych: Cognition and judgment appear intact.  Cooperative with normal attention span and concentration.  Behavior appropriate. No anxious or depressed appearing.     Assessment    ASSESSMENT. 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  Ambulatory BPs okay, BP today is okay.  No change.  Labs Hyperlipidemia: Diet controlled, check labs GERD: Declines to take PPIs due to history of osteoporosis.  On Tums.  She still have symptoms.  Recommend famotidine nightly.  Rx sent. Osteoporosis: Declined Fosamax, encourage calcium, vitamin D. Vitamin D  deficiency: Labs. MMG: Referred to Endo for nonurgent follow-up.  Physical exam stable.  Last TSH normal. RTC 6 months 6 Tdap: 2022 Shingrix:  declines  Covid  booster recommended  RSV d/w pt Flu shot today  CCS: Had a colonoscopy 02-2019, next 5 years  per GI letter  MMG: 12-2018, plans to schedule f/u  Last PAP 11/06/2020 Labs:CMP, FLP, vitamin D Diet exercise: Discussed OSA discussed in Spanish.

## 2022-01-15 ENCOUNTER — Encounter: Payer: Self-pay | Admitting: Internal Medicine

## 2022-01-15 NOTE — Assessment & Plan Note (Signed)
Tdap: 2022 Shingrix:  declines  Covid  booster recommended  RSV d/w pt Flu shot today  CCS: Had a colonoscopy 02-2019, next 5 years  per GI letter  MMG: 12-2020, plans to schedule f/u  Last PAP 11/06/2020 Labs:CMP, FLP, vitamin D Diet exercise: Discussed POA discussed in Spanish.

## 2022-01-15 NOTE — Assessment & Plan Note (Signed)
Here for CPX HTN: On metoprolol, took amlodipine x1 week, had a headache, she stopped it.  Ambulatory BPs okay, BP today is okay.  No change.  Labs, monitor BP.  Consider add a different medication. Hyperlipidemia: Diet controlled, check labs GERD: Declines to take PPIs due to history of osteoporosis.  On Tums.  She still have symptoms.  Recommend famotidine nightly.  Rx sent. Osteoporosis: Declined Fosamax, encourage calcium, vitamin D. Vitamin D deficiency: Labs. MNG: Referred to Endo for nonurgent follow-up.  Physical exam stable.  Last TSH normal. RTC 6 months

## 2022-01-20 DIAGNOSIS — E119 Type 2 diabetes mellitus without complications: Secondary | ICD-10-CM | POA: Diagnosis not present

## 2022-02-12 ENCOUNTER — Ambulatory Visit
Admission: RE | Admit: 2022-02-12 | Discharge: 2022-02-12 | Disposition: A | Discharge status: Home / Self Care | Payer: BC Managed Care – PPO | Source: Ambulatory Visit | Attending: Internal Medicine | Admitting: Internal Medicine

## 2022-02-12 DIAGNOSIS — Z1231 Encounter for screening mammogram for malignant neoplasm of breast: Secondary | ICD-10-CM | POA: Diagnosis not present

## 2022-02-15 ENCOUNTER — Other Ambulatory Visit: Payer: Self-pay | Admitting: Internal Medicine

## 2022-02-15 DIAGNOSIS — R928 Other abnormal and inconclusive findings on diagnostic imaging of breast: Secondary | ICD-10-CM

## 2022-02-20 DIAGNOSIS — E119 Type 2 diabetes mellitus without complications: Secondary | ICD-10-CM | POA: Diagnosis not present

## 2022-02-27 ENCOUNTER — Ambulatory Visit
Admission: RE | Admit: 2022-02-27 | Discharge: 2022-02-27 | Disposition: A | Discharge status: Home / Self Care | Payer: BC Managed Care – PPO | Source: Ambulatory Visit | Attending: Internal Medicine | Admitting: Internal Medicine

## 2022-02-27 ENCOUNTER — Other Ambulatory Visit: Payer: Self-pay | Admitting: Internal Medicine

## 2022-02-27 DIAGNOSIS — R928 Other abnormal and inconclusive findings on diagnostic imaging of breast: Secondary | ICD-10-CM

## 2022-02-27 DIAGNOSIS — R921 Mammographic calcification found on diagnostic imaging of breast: Secondary | ICD-10-CM | POA: Diagnosis not present

## 2022-03-01 ENCOUNTER — Other Ambulatory Visit (HOSPITAL_BASED_OUTPATIENT_CLINIC_OR_DEPARTMENT_OTHER): Payer: Self-pay

## 2022-03-22 DIAGNOSIS — E119 Type 2 diabetes mellitus without complications: Secondary | ICD-10-CM | POA: Diagnosis not present

## 2022-04-12 ENCOUNTER — Other Ambulatory Visit (HOSPITAL_BASED_OUTPATIENT_CLINIC_OR_DEPARTMENT_OTHER): Payer: Self-pay

## 2022-04-22 DIAGNOSIS — E119 Type 2 diabetes mellitus without complications: Secondary | ICD-10-CM | POA: Diagnosis not present

## 2022-05-23 DIAGNOSIS — E119 Type 2 diabetes mellitus without complications: Secondary | ICD-10-CM | POA: Diagnosis not present

## 2022-05-27 ENCOUNTER — Other Ambulatory Visit (HOSPITAL_BASED_OUTPATIENT_CLINIC_OR_DEPARTMENT_OTHER): Payer: Self-pay

## 2022-06-21 DIAGNOSIS — E119 Type 2 diabetes mellitus without complications: Secondary | ICD-10-CM | POA: Diagnosis not present

## 2022-07-08 ENCOUNTER — Encounter: Payer: Self-pay | Admitting: Internal Medicine

## 2022-07-08 ENCOUNTER — Other Ambulatory Visit (HOSPITAL_BASED_OUTPATIENT_CLINIC_OR_DEPARTMENT_OTHER): Payer: Self-pay

## 2022-07-08 ENCOUNTER — Ambulatory Visit (INDEPENDENT_AMBULATORY_CARE_PROVIDER_SITE_OTHER): Payer: BC Managed Care – PPO | Admitting: Internal Medicine

## 2022-07-08 VITALS — BP 126/80 | HR 63 | Temp 97.8°F | Resp 16 | Ht 61.0 in | Wt 172.5 lb

## 2022-07-08 DIAGNOSIS — R1011 Right upper quadrant pain: Secondary | ICD-10-CM | POA: Diagnosis not present

## 2022-07-08 DIAGNOSIS — I10 Essential (primary) hypertension: Secondary | ICD-10-CM

## 2022-07-08 LAB — CBC WITH DIFFERENTIAL/PLATELET
Basophils Absolute: 0 10*3/uL (ref 0.0–0.1)
Basophils Relative: 0.6 % (ref 0.0–3.0)
Eosinophils Absolute: 0.3 10*3/uL (ref 0.0–0.7)
Eosinophils Relative: 4.6 % (ref 0.0–5.0)
HCT: 37.8 % (ref 36.0–46.0)
Hemoglobin: 12.9 g/dL (ref 12.0–15.0)
Lymphocytes Relative: 31.9 % (ref 12.0–46.0)
Lymphs Abs: 2.3 10*3/uL (ref 0.7–4.0)
MCHC: 34.2 g/dL (ref 30.0–36.0)
MCV: 86.7 fl (ref 78.0–100.0)
Monocytes Absolute: 0.5 10*3/uL (ref 0.1–1.0)
Monocytes Relative: 7.4 % (ref 3.0–12.0)
Neutro Abs: 4 10*3/uL (ref 1.4–7.7)
Neutrophils Relative %: 55.5 % (ref 43.0–77.0)
Platelets: 240 10*3/uL (ref 150.0–400.0)
RBC: 4.36 Mil/uL (ref 3.87–5.11)
RDW: 14.1 % (ref 11.5–15.5)
WBC: 7.3 10*3/uL (ref 4.0–10.5)

## 2022-07-08 LAB — COMPREHENSIVE METABOLIC PANEL
ALT: 41 U/L — ABNORMAL HIGH (ref 0–35)
AST: 28 U/L (ref 0–37)
Albumin: 4 g/dL (ref 3.5–5.2)
Alkaline Phosphatase: 115 U/L (ref 39–117)
BUN: 18 mg/dL (ref 6–23)
CO2: 27 mEq/L (ref 19–32)
Calcium: 9.1 mg/dL (ref 8.4–10.5)
Chloride: 104 mEq/L (ref 96–112)
Creatinine, Ser: 0.59 mg/dL (ref 0.40–1.20)
GFR: 95.02 mL/min (ref >60.00)
Glucose, Bld: 93 mg/dL (ref 70–99)
Potassium: 4.1 mEq/L (ref 3.5–5.1)
Sodium: 140 mEq/L (ref 135–145)
Total Bilirubin: 0.3 mg/dL (ref 0.2–1.2)
Total Protein: 7.4 g/dL (ref 6.0–8.3)

## 2022-07-08 LAB — GAMMA GT: GGT: 24 U/L (ref 7–51)

## 2022-07-08 MED ORDER — PANTOPRAZOLE SODIUM 40 MG PO TBEC
40.0000 mg | DELAYED_RELEASE_TABLET | Freq: Every day | ORAL | 3 refills | Status: DC
  Filled 2022-07-08: qty 30, 30d supply, fill #0
  Filled 2022-08-07: qty 30, 30d supply, fill #1
  Filled 2022-11-25: qty 30, 30d supply, fill #2
  Filled 2022-12-20: qty 30, 30d supply, fill #3

## 2022-07-08 NOTE — Assessment & Plan Note (Signed)
HTN: On metoprolol, BP today is very good.  Home in the 130s.  Check CMP.  No change. Vitamin D deficiency: Last level satisfactory, recommend to continue vitamin D over-the-counter 2000 units daily RUQ abdominal pain: h/o remote cholecystectomy, having postprandial abdominal pain.  Exam today is benign.  Will get a CBC, also rec more aggressive treatment for GERD, see next GERD: On famotidine, symptoms relatively well-controlled, has been reluctant to take PPIs due to history of osteoporosis but for now recommend pantoprazole for few months. Increased alkaline phosphatase: Mild, previous ultrasound showed fatty liver.  Checking a GGT RTC 3 months

## 2022-07-08 NOTE — Progress Notes (Signed)
Subjective:    Patient ID: Lauren Hodge, female    DOB: Apr 29, 1955, 67 y.o.   MRN: [id number]  DOS:  07/08/2022 Type of visit - description:  Routine checkup, here with her husband.  GERD: Self stopped famotidine for a while, symptoms resurface, mostly heartburn.  Restarted famotidine and symptoms are better However a week ago developed postprandial RUQ abdominal pain.  Typically postprandial, no associated nausea vomiting.  No diarrhea. No dysphagia or odynophagia.  Ambulatory BPs within normal.  Review of Systems See above   Past Medical History:  Diagnosis Date   Carpal tunnel syndrome    Fracture of carpal bone 2018   bilateral   GERD (gastroesophageal reflux disease)    Hemorrhoids    Hiatal hernia 02/2019   seen on EGD    History of left breast biopsy    Benign   HTN (hypertension)    Hyperlipidemia    Insomnia    Osteoporosis    Vitamin D deficiency     Past Surgical History:  Procedure Laterality Date   BREAST BIOPSY  2010   x2, 2010-2012 (no cancer)   CHOLECYSTECTOMY  2015   HEMORRHOIDECTOMY WITH HEMORRHOID BANDING  04/22/2017    Current Outpatient Medications  Medication Instructions   famotidine (PEPCID) 40 mg, Oral, Daily   ibuprofen (ADVIL) 600 MG tablet Take 1 tablet (600 mg total) by mouth every 6 to 8 hours as needed for pain   metoprolol succinate (TOPROL-XL) 25 MG 24 hr tablet Take 1 tablet (25 mg total) by mouth daily with or immediately following a meal   VITAMIN D, CHOLECALCIFEROL, PO Take by mouth.       Objective:   Physical Exam BP 126/80   Pulse 63   Temp 97.8 F (36.6 C) (Oral)   Resp 16   Ht 5\' 1"  (1.549 m)   Wt 172 lb 8 oz (78.2 kg)   SpO2 98%   BMI 32.59 kg/m  General:   Well developed, NAD, BMI noted. HEENT:  Normocephalic . Face symmetric, atraumatic Lungs:  CTA B Normal respiratory effort, no intercostal retractions, no accessory muscle use. Heart: RRR,  no murmur. Abdomen: No TTP at the RUQ.  No mass, no  organomegaly. Lower extremities: no pretibial edema bilaterally  Skin: Not pale. Not jaundice Neurologic:  alert & oriented X3.  Speech normal, gait appropriate for age and unassisted Psych--  Cognition and judgment appear intact.  Cooperative with normal attention span and concentration.  Behavior appropriate. No anxious or depressed appearing.      Assessment    ASSESSMENT. 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  Home in the 130s.  Check CMP.  No change. Vitamin D deficiency: Last level satisfactory, recommend to continue vitamin D over-the-counter 2000 units daily RUQ abdominal pain: h/o remote cholecystectomy, having postprandial abdominal pain.  Exam today is benign.  Will get a CBC, also rec more aggressive treatment for GERD, see next GERD: On famotidine, symptoms relatively well-controlled, has been reluctant to take PPIs due to history of osteoporosis but for now recommend pantoprazole for few months. Increased alkaline phosphatase: Mild, previous ultrasound showed fatty liver.  Checking a GGT RTC 3 months

## 2022-07-08 NOTE — Patient Instructions (Addendum)
Is important you see the endocrinologist for your thyroid disease. You can reach them at: [phone]  Stop famotidine, start pantoprazole 40 mg 1 tablet before breakfast  Check the  blood pressure regularly BP GOAL is between 110/65 and  135/85. If it is consistently higher or lower, let me know     GO TO THE LAB : Get the blood work     Lauren Hodge, [place] back for checkup in 4 months

## 2022-07-22 DIAGNOSIS — E119 Type 2 diabetes mellitus without complications: Secondary | ICD-10-CM | POA: Diagnosis not present

## 2022-08-05 ENCOUNTER — Encounter: Payer: Self-pay | Admitting: Internal Medicine

## 2022-08-05 ENCOUNTER — Ambulatory Visit (INDEPENDENT_AMBULATORY_CARE_PROVIDER_SITE_OTHER): Payer: BC Managed Care – PPO | Admitting: Internal Medicine

## 2022-08-05 VITALS — BP 124/82 | HR 78 | Ht 61.0 in | Wt 170.0 lb

## 2022-08-05 DIAGNOSIS — E042 Nontoxic multinodular goiter: Secondary | ICD-10-CM | POA: Diagnosis not present

## 2022-08-05 LAB — T4, FREE: Free T4: 0.84 ng/dL (ref 0.60–1.60)

## 2022-08-05 LAB — TSH: TSH: 1.08 u[IU]/mL (ref 0.35–5.50)

## 2022-08-05 NOTE — Progress Notes (Unsigned)
Name: Lauren Hodge  MRN/ DOB: [id number], 1955/06/16    Age/ Sex: 67 y.o., female    PCP: Wanda Plump, MD   Reason for Endocrinology Evaluation: MNG     Date of Initial Endocrinology Evaluation: 09/19/2020    HPI: [name] is a 67 y.o. female with a past medical history of HTN.  The patient presented for initial endocrinology clinic visit on 09/19/2020 for consultative assistance with her MNG .    She has been noted with left thyroid nodule on physician exam prompting a thyroid ultrasound  (08/2020)which showed multinodular goiter with a left thyroid nodule 1 cm meeting criteria for FNA .  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 is here for a follow up on MNG.    Weight is stable  Denies local neck swelling  Denies palpitations  Denies diarrhea  Denies hand tremors  Has occasional joint aches    HISTORY:  Past Medical History:  Past Medical History:  Diagnosis Date   Carpal tunnel syndrome    Fracture of carpal bone 2018   bilateral   GERD (gastroesophageal reflux disease)    Hemorrhoids    Hiatal hernia 02/2019   seen on EGD    History of left breast biopsy    Benign   HTN (hypertension)    Hyperlipidemia    Insomnia    Osteoporosis    Vitamin D deficiency    Past Surgical History:  Past Surgical History:  Procedure Laterality Date   BREAST BIOPSY  2010   x2, 2010-2012 (no cancer)   CHOLECYSTECTOMY  2015   HEMORRHOIDECTOMY WITH HEMORRHOID BANDING  04/22/2017    Social History:  reports that she quit smoking about 3 years ago. Her smoking use included cigarettes. She has never used smokeless tobacco. She reports that she does not currently use alcohol. She reports that she does not currently use drugs. Family History: family history includes Cancer in  her maternal aunt and maternal grandmother; Hypertension in her mother.   HOME MEDICATIONS: Allergies as of 08/05/2022   No Known Allergies      Medication List        Accurate as of August 05, 2022 11:59 PM. If you have any questions, ask your nurse or doctor.          ibuprofen 600 MG tablet Commonly known as: ADVIL Take 1 tablet (600 mg total) by mouth every 6 to 8 hours as needed for pain   metoprolol succinate 25 MG 24 hr tablet Commonly known as: TOPROL-XL Take 1 tablet (25 mg total) by mouth daily with or immediately following a meal   pantoprazole 40 MG tablet Commonly known as: PROTONIX Tome 1 tableta (40 mg en total) por va oral diariamente antes de desayunar. (Take 1 tablet (40 mg total) by mouth daily before breakfast.)   VITAMIN D (CHOLECALCIFEROL) PO Take by mouth.          REVIEW OF SYSTEMS: A comprehensive ROS was conducted with the patient and is negative except as per HPI   OBJECTIVE:  VS: BP 124/82 (BP Location: Left Arm, Patient Position: Sitting, Cuff Size: Large)   Pulse 78   Ht  (1.549 m)   Wt 170 lb (77.1 kg)   SpO2 95%  BMI 32.12 kg/m    Wt Readings from Last 3 Encounters:  08/05/22 170 lb (77.1 kg)  07/08/22 172 lb 8 oz (78.2 kg)  01/14/22 174 lb 8 oz (79.2 kg)     EXAM: General: Pt appears well and is in NAD  Neck: General: Supple without adenopathy. Thyroid: Thyroid size normal.  Right thyroid  nodule appreciated, I am unable to appreciate left nodule on today's exam.   Lungs: Clear with good BS bilat   Heart: Auscultation: RRR.  Abdomen: soft, nontender  Extremities:  BL LE: No pretibial edema   Mental Status: Judgment, insight: Intact Orientation: Oriented to time, place, and person Mood and affect: No depression, anxiety, or agitation     DATA REVIEWED:  Latest Reference Range & Units 08/05/22 11:07  TSH 0.35 - 5.50 uIU/mL 1.08  T4,Free(Direct) 0.60 - 1.60 ng/dL 2.95     FNA 10/10/3084   Clinical  History: Left; Inferior 1.0cm; Other 2 dimensions: 0.9cm, Solid  / almost completely solid, Hypoechoic, TI-RADS total points 7.  Specimen Submitted:  A. 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 Afirma was benign  - Will proceed with repeat thyroid ultrasound    F/U in 1 yr    Signed electronically by: Lyndle Herrlich, MD  [facility] End[facility]  [facility] [address] [place], [place] [address] Phone: [phone] FAX: [phone]   CC: Wanda Plump, MD [address] [place] [address] Phone: [phone] Fax: [phone]   Return to Endocrinology clinic as below: Future Appointments  Date Time Provider Department Center  08/29/2022 10:20 AM [facility] 1 [facility]  11/08/2022  8:40 AM Wanda Plump, MD [facility]  08/06/2023  8:30 AM Isobella Ascher, Konrad Dolores, MD [facility] None

## 2022-08-07 ENCOUNTER — Ambulatory Visit (HOSPITAL_BASED_OUTPATIENT_CLINIC_OR_DEPARTMENT_OTHER)
Admission: RE | Admit: 2022-08-07 | Discharge: 2022-08-07 | Disposition: A | Discharge status: Home / Self Care | Payer: BC Managed Care – PPO | Source: Ambulatory Visit | Attending: Internal Medicine | Admitting: Internal Medicine

## 2022-08-07 ENCOUNTER — Other Ambulatory Visit (HOSPITAL_BASED_OUTPATIENT_CLINIC_OR_DEPARTMENT_OTHER): Payer: Self-pay

## 2022-08-07 DIAGNOSIS — E042 Nontoxic multinodular goiter: Secondary | ICD-10-CM | POA: Diagnosis not present

## 2022-08-28 ENCOUNTER — Other Ambulatory Visit: Payer: Self-pay | Admitting: Internal Medicine

## 2022-08-28 ENCOUNTER — Other Ambulatory Visit (HOSPITAL_BASED_OUTPATIENT_CLINIC_OR_DEPARTMENT_OTHER): Payer: Self-pay

## 2022-08-28 MED ORDER — METOPROLOL SUCCINATE ER 25 MG PO TB24
25.0000 mg | ORAL_TABLET | Freq: Every day | ORAL | 1 refills | Status: DC
  Filled 2022-08-28: qty 90, 90d supply, fill #0
  Filled 2022-11-25 (×2): qty 90, 90d supply, fill #1

## 2022-08-29 ENCOUNTER — Ambulatory Visit
Admission: RE | Admit: 2022-08-29 | Discharge: 2022-08-29 | Disposition: A | Discharge status: Home / Self Care | Payer: BC Managed Care – PPO | Source: Ambulatory Visit | Attending: Internal Medicine | Admitting: Internal Medicine

## 2022-08-29 DIAGNOSIS — R928 Other abnormal and inconclusive findings on diagnostic imaging of breast: Secondary | ICD-10-CM

## 2022-09-03 ENCOUNTER — Other Ambulatory Visit: Payer: Self-pay | Admitting: Internal Medicine

## 2022-09-03 DIAGNOSIS — R921 Mammographic calcification found on diagnostic imaging of breast: Secondary | ICD-10-CM

## 2022-09-19 ENCOUNTER — Other Ambulatory Visit (INDEPENDENT_AMBULATORY_CARE_PROVIDER_SITE_OTHER): Payer: BC Managed Care – PPO

## 2022-09-19 ENCOUNTER — Ambulatory Visit (INDEPENDENT_AMBULATORY_CARE_PROVIDER_SITE_OTHER): Payer: BC Managed Care – PPO | Admitting: Sports Medicine

## 2022-09-19 ENCOUNTER — Encounter: Payer: Self-pay | Admitting: Sports Medicine

## 2022-09-19 DIAGNOSIS — M65332 Trigger finger, left middle finger: Secondary | ICD-10-CM

## 2022-09-19 DIAGNOSIS — M79642 Pain in left hand: Secondary | ICD-10-CM

## 2022-09-19 NOTE — Progress Notes (Signed)
Lauren Hodge - 67 y.o. female MRN [id number]  Date of birth: Dec 07, 1955  Office Visit Note: Visit Date: 09/19/2022 PCP: Wanda Plump, MD Referred by: Wanda Plump, MD  Subjective: Chief Complaint  Patient presents with   Left Hand - Pain   HPI: Lauren Hodge is a pleasant 67 y.o. female who presents today for left middle finger pain. The use of an in-person spanish interpreter was used throughout the entirety of the visit today.  Previously saw Dr. Leanne Chang on 09/11/21 a right middle finger trigger finger. Did well with A1 pulley trigger finger injection. Did excellent with this without reoccurence. She does use her hands frequently.  Here today with left middle finger pain. Feels stiff and painful.  She will get some triggering episodes as well but she feels like she will need to possibly pull her finger open.  Pain is worse in the morning, when she runs her hands under warm water this will relieve the triggering and her pain.  Not like taking much medication but can take ibuprofen or Tylenol as needed.  Pertinent ROS were reviewed with the patient and found to be negative unless otherwise specified above in HPI.   Assessment & Plan: Visit Diagnoses:  1. Trigger finger, left middle finger   2. Pain in left hand    Plan: Discussed with Arriana that she does have symptoms suggestive of trigger finger of the long finger.  She does have some mild arthritic change do not think this is what is causing her pain.  Gust of treatment options such as oral medication therapy, splinting, injection therapy.  Through shared decision-making, elected to proceed with trigger finger injection into the A1 pulley of the right middle finger, patient tolerated well.  We did apply a Band-Aid splint which I would like her to remain in for the next 4 days to limit flexion of the finger.  Starting on Tuesday she may come out of this.  I would like her to use ibuprofen once to twice daily over the next 4-5  days as well and then may discontinue to help with anti-inflammatory effects.  I will see her back in 1 month to ensure she is not having any reproduction of her triggering episodes.  She may call or return sooner if any issues arise.  Follow-up: Return in about 1 month (around 10/20/2022) for for left middle trigger finger.   Meds & Orders: No orders of the defined types were placed in this encounter.   Orders Placed This Encounter  Procedures   XR Hand Complete Left     Procedures: No procedures performed      Clinical History: No specialty comments available.  She reports that she quit smoking about 4 years ago. Her smoking use included cigarettes. She has never used smokeless tobacco. No results for input(s): "HGBA1C", "LABURIC" in the last 8760 hours.  Objective:   Vital Signs: There were no vitals taken for this visit.  Physical Exam  Gen: Well-appearing, in no acute distress; non-toxic CV:  Well-perfused. Warm.  Resp: Breathing unlabored on room air; no wheezing. Psych: Fluid speech in conversation; appropriate affect; normal thought process Neuro: Sensation intact throughout. No gross coordination deficits.   Ortho Exam - Left hand: Positive TTP over the A1 nodule on the volar aspect of the left middle finger.  There is a few episodes of reproducible triggering and clicking with closed fist and open fist testing.  No PIP or DIP swelling or dactylitis.  Full range of motion of the wrist and other fingers.  Imaging: XR Hand Complete Left  Result Date: 09/19/2022 3 views of the hand including AP, oblique and lateral femoral ordered and reviewed by myself.  There is some mild arthritic change at the distal radial carpal joint.  No significant soft tissue abnormalities or advanced arthritic change throughout any of the fingers.  No acute fracture noted.   Past Medical/Family/Surgical/Social History: Medications & Allergies reviewed per EMR, new medications updated. Patient  Active Problem List   Diagnosis Date Noted   Trigger finger, right middle finger 09/11/2021   COVID-19 04/12/2021   Acute cough 04/12/2021   Annual physical exam 10/31/2020   PCP NOTES >>>>>>>>>>>>>>>>>> 08/26/2020   GERD (gastroesophageal reflux disease) 07/26/2020   Hyperlipidemia 10/06/2019   Vitamin D deficiency 06/18/2018   Hypertensive disorder 05/18/2018   Past Medical History:  Diagnosis Date   Carpal tunnel syndrome    Fracture of carpal bone 2018   bilateral   GERD (gastroesophageal reflux disease)    Hemorrhoids    Hiatal hernia 02/2019   seen on EGD    History of left breast biopsy    Benign   HTN (hypertension)    Hyperlipidemia    Insomnia    Osteoporosis    Vitamin D deficiency    Family History  Problem Relation Age of Onset   Cancer Maternal Grandmother        vaginal   Cancer Maternal Aunt        tongue   Hypertension Mother    CAD Neg Hx    Diabetes Neg Hx    Colon cancer Neg Hx    Breast cancer Neg Hx    Past Surgical History:  Procedure Laterality Date   BREAST BIOPSY  2010   x2, 2010-2012 (no cancer)   CHOLECYSTECTOMY  2015   HEMORRHOIDECTOMY WITH HEMORRHOID BANDING  04/22/2017   Social History   Occupational History   Occupation: retired   Tobacco Use   Smoking status: Former    Types: Cigarettes    Quit date: 08/25/2018    Years since quitting: 4.0   Smokeless tobacco: Never   Tobacco comments:    used to smoke rarely   Substance and Sexual Activity   Alcohol use: Not Currently    Comment: occasional   Drug use: Not Currently   Sexual activity: Not Currently

## 2022-09-19 NOTE — Progress Notes (Signed)
Left hand pain; mainly middle finger States it is stiff and painful Primarily pain at night No swelling that she can tell No injury

## 2022-10-18 ENCOUNTER — Ambulatory Visit: Payer: BC Managed Care – PPO | Admitting: Sports Medicine

## 2022-11-08 ENCOUNTER — Ambulatory Visit: Payer: Self-pay | Admitting: Internal Medicine

## 2022-11-20 ENCOUNTER — Encounter (INDEPENDENT_AMBULATORY_CARE_PROVIDER_SITE_OTHER): Payer: Self-pay

## 2022-11-25 ENCOUNTER — Other Ambulatory Visit (HOSPITAL_BASED_OUTPATIENT_CLINIC_OR_DEPARTMENT_OTHER): Payer: Self-pay

## 2022-12-04 ENCOUNTER — Encounter: Payer: Self-pay | Admitting: Internal Medicine

## 2022-12-04 ENCOUNTER — Ambulatory Visit: Payer: Medicare HMO | Admitting: Internal Medicine

## 2022-12-04 ENCOUNTER — Other Ambulatory Visit (HOSPITAL_BASED_OUTPATIENT_CLINIC_OR_DEPARTMENT_OTHER): Payer: Self-pay

## 2022-12-04 VITALS — BP 164/98 | HR 64 | Temp 97.8°F | Resp 18 | Ht 61.0 in | Wt 169.6 lb

## 2022-12-04 DIAGNOSIS — I10 Essential (primary) hypertension: Secondary | ICD-10-CM | POA: Diagnosis not present

## 2022-12-04 DIAGNOSIS — K219 Gastro-esophageal reflux disease without esophagitis: Secondary | ICD-10-CM

## 2022-12-04 DIAGNOSIS — Z0000 Encounter for general adult medical examination without abnormal findings: Secondary | ICD-10-CM | POA: Diagnosis not present

## 2022-12-04 DIAGNOSIS — R1011 Right upper quadrant pain: Secondary | ICD-10-CM | POA: Diagnosis not present

## 2022-12-04 DIAGNOSIS — Z09 Encounter for follow-up examination after completed treatment for conditions other than malignant neoplasm: Secondary | ICD-10-CM

## 2022-12-04 MED ORDER — LOSARTAN POTASSIUM 50 MG PO TABS
50.0000 mg | ORAL_TABLET | Freq: Every day | ORAL | 1 refills | Status: DC
  Filled 2022-12-04: qty 30, 30d supply, fill #0
  Filled 2022-12-31: qty 30, 30d supply, fill #1

## 2022-12-04 NOTE — Assessment & Plan Note (Deleted)
HTN: On metoprolol, BP elevated today, recheck: Still elevated.  Heart rate 64.  Plan: Add losartan 50 mg, BMP 2 weeks, start monitoring BPs at home. GERD: Under excellent control. RUQ abdominal pain: Started several months ago. Has a hard time describing it, increase with food?  Increase with certain torso movements?   History of cholecystectomy History of transient increased LFTs, [place] 2022:   fatty liver. UTD colon cancer screening EGD unremarkable 2020. Plan: CT abdomen and pelvis, refer to GI.  IBS? RTC CPX 3 months.

## 2022-12-04 NOTE — Patient Instructions (Addendum)
Continue metoprolol   daily Start losartan 50 mg 1 tablet daily.  Check the  blood pressure regularly Blood pressure goal:  between 110/65 and  135/85. If it is consistently higher or lower, let me know  Watch her salt intake  Will arrange a CAT scan of your abdomen.  Will arrange referral to gastroenterology      GO TO THE FRONT DESK, PLEASE SCHEDULE YOUR APPOINTMENTS Come back for blood work only in 2 weeks  Come back for a physical exam in 3 months

## 2022-12-04 NOTE — Progress Notes (Signed)
Subjective:    Patient ID: Lauren Hodge, female    DOB: Dec 20, 1955, 67 y.o.   MRN: [id number]  DOS:  12/04/2022 Type of visit - description: Follow-up  Chronic medical problems discussed.  HTN: BP is elevated today, no ambulatory BPs, has taking metoprolol regularly except today.  Again reports RUQ abdominal pain described as a "bloated feeling". Pain might  last the whole day. Worse with food sometimes, but also worse with certain movements at times. Otherwise she has a hard time describing her sxs despite multiple questions Denies nausea vomiting.  No weight loss.   BP Readings from Last 3 Encounters:  12/04/22 (!) 160/90  08/05/22 124/82  07/08/22 126/80    Review of Systems See above   Past Medical History:  Diagnosis Date   Carpal tunnel syndrome    Fracture of carpal bone 2018   bilateral   GERD (gastroesophageal reflux disease)    Hemorrhoids    Hiatal hernia 02/2019   seen on EGD    History of left breast biopsy    Benign   HTN (hypertension)    Hyperlipidemia    Insomnia    Osteoporosis    Vitamin D deficiency     Past Surgical History:  Procedure Laterality Date   BREAST BIOPSY  2010   x2, 2010-2012 (no cancer)   CHOLECYSTECTOMY  2015   HEMORRHOIDECTOMY WITH HEMORRHOID BANDING  04/22/2017    Current Outpatient Medications  Medication Instructions   ibuprofen (ADVIL) 600 MG tablet Take 1 tablet (600 mg total) by mouth every 6 to 8 hours as needed for pain   metoprolol succinate (TOPROL-XL) 25 MG 24 hr tablet Take 1 tablet (25 mg total) by mouth daily with or immediately following a meal   pantoprazole (PROTONIX) 40 mg, Oral, Daily before breakfast   VITAMIN D, CHOLECALCIFEROL, PO Oral       Objective:   Physical Exam BP (!) 160/90 (BP Location: Left Arm, Patient Position: Sitting, Cuff Size: Normal)   Pulse 64   Temp 97.8 F (36.6 C) (Oral)   Resp 18   Ht 5\' 1"  (1.549 m)   Wt 169 lb 9.6 oz (76.9 kg)   SpO2 99%   BMI 32.05 kg/m   General:   Well developed, NAD, BMI noted.  HEENT:  Normocephalic . Face symmetric, atraumatic Lungs:  CTA B Normal respiratory effort, no intercostal retractions, no accessory muscle use. Heart: RRR,  no murmur.  Abdomen:  Not distended, soft, non-tender. No rebound or rigidity.   Skin: Not pale. Not jaundice Lower extremities: no pretibial edema bilaterally  Neurologic:  alert & oriented X3.  Speech normal, gait appropriate for age and unassisted Psych--  Cognition and judgment appear intact.  Cooperative with normal attention span and concentration.  Behavior appropriate. No anxious or depressed appearing.     Assessment     ASSESSMENT. new patient 08/2020 HTN  amlodipine: Intolerant palpation, headache, edema.  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    US liver 2022: Likely steatosis  PLAN HTN: On metoprolol, BP elevated today, recheck: Still elevated.  Heart rate 64.  Plan: Add losartan 50 mg, BMP 2 weeks, start monitoring BPs at home. GERD: Under excellent  control. RUQ abdominal pain: Started several months ago. Has a hard time describing it, increase with food?  Increase with certain torso movements?   History of cholecystectomy History of transient increased LFTs, [place] 2022:   fatty liver. UTD colon cancer screening EGD unremarkable 2020. Plan: CT abdomen and pelvis, refer to GI.  IBS? RTC CPX 3 months.

## 2022-12-18 ENCOUNTER — Other Ambulatory Visit (INDEPENDENT_AMBULATORY_CARE_PROVIDER_SITE_OTHER): Payer: Medicare HMO

## 2022-12-18 ENCOUNTER — Other Ambulatory Visit: Payer: Self-pay

## 2022-12-18 DIAGNOSIS — I10 Essential (primary) hypertension: Secondary | ICD-10-CM | POA: Diagnosis not present

## 2022-12-18 LAB — BASIC METABOLIC PANEL
BUN: 12 mg/dL (ref 6–23)
CO2: 30 mEq/L (ref 19–32)
Calcium: 9.4 mg/dL (ref 8.4–10.5)
Chloride: 104 mEq/L (ref 96–112)
Creatinine, Ser: 0.58 mg/dL (ref 0.40–1.20)
GFR: 95.11 mL/min (ref >60.00)
Glucose, Bld: 95 mg/dL (ref 70–99)
Potassium: 4.1 mEq/L (ref 3.5–5.1)
Sodium: 140 mEq/L (ref 135–145)

## 2022-12-20 ENCOUNTER — Ambulatory Visit (INDEPENDENT_AMBULATORY_CARE_PROVIDER_SITE_OTHER): Payer: Medicare HMO | Admitting: Internal Medicine

## 2022-12-20 ENCOUNTER — Encounter: Payer: Self-pay | Admitting: Internal Medicine

## 2022-12-20 ENCOUNTER — Other Ambulatory Visit (HOSPITAL_BASED_OUTPATIENT_CLINIC_OR_DEPARTMENT_OTHER): Payer: Self-pay

## 2022-12-20 VITALS — BP 142/90 | HR 72 | Temp 98.0°F | Resp 18 | Ht 61.0 in | Wt 169.2 lb

## 2022-12-20 DIAGNOSIS — N6459 Other signs and symptoms in breast: Secondary | ICD-10-CM

## 2022-12-20 NOTE — Progress Notes (Unsigned)
Subjective:    Patient ID: Lauren Hodge, female    DOB: 01/19/1956, 67 y.o.   MRN: [id number]  DOS:  12/20/2022 Type of visit - description: Acute  Noted R nipple bleeding 6 days ago. Left breast is normal. No abnormalities on self-exam otherwise. Denies any fever or chills. No breast edema or injury. No rash. Review of Systems See above   Past Medical History:  Diagnosis Date   Carpal tunnel syndrome    Fracture of carpal bone 2018   bilateral   GERD (gastroesophageal reflux disease)    Hemorrhoids    Hiatal hernia 02/2019   seen on EGD    History of left breast biopsy    Benign   HTN (hypertension)    Hyperlipidemia    Insomnia    Osteoporosis    Vitamin D deficiency     Past Surgical History:  Procedure Laterality Date   BREAST BIOPSY  2010   x2, 2010-2012 (no cancer)   CHOLECYSTECTOMY  2015   HEMORRHOIDECTOMY WITH HEMORRHOID BANDING  04/22/2017    Current Outpatient Medications  Medication Instructions   ibuprofen (ADVIL) 600 MG tablet Take 1 tablet (600 mg total) by mouth every 6 to 8 hours as needed for pain   losartan (COZAAR) 50 mg, Oral, Daily   metoprolol succinate (TOPROL-XL) 25 MG 24 hr tablet Take 1 tablet (25 mg total) by mouth daily with or immediately following a meal   pantoprazole (PROTONIX) 40 mg, Oral, Daily before breakfast   VITAMIN D, CHOLECALCIFEROL, PO Oral       Objective:   Physical Exam BP (!) 152/90 (BP Location: Left Arm, Patient Position: Sitting, Cuff Size: Normal)   Pulse 72   Temp 98 F (36.7 C) (Oral)   Resp 18   Ht 5\' 1"  (1.549 m)   Wt 169 lb 3.2 oz (76.7 kg)   SpO2 98%   BMI 31.97 kg/m  General:   Well developed, NAD, BMI noted. HEENT:  Normocephalic . Face symmetric, atraumatic Breast exam (chaperoned   by one of my CMAs). L breast: Normal Right breast: Nipple inverted, but otherwise normal-appearing, after applying a mild amount of pressure I was able to get a drop off blood from it. On exam there is  no dominant nodules.  Skin is normal. Axillary areas: Free of LAD's. Skin: Not pale. Not jaundice Neurologic:  alert & oriented X3.  Speech normal, gait appropriate for age and unassisted Psych--  Cognition and judgment appear intact.  Cooperative with normal attention span and concentration.  Behavior appropriate. No anxious or depressed appearing.      Assessment     ASSESSMENT. new patient 08/2020 HTN  amlodipine: Intolerant palpation, headache, edema.  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    US liver 2022: Likely steatosis  PLAN Nipple discharge, R: Chart is reviewed. 02/12/2022: Abnormal mammogram 02/27/2022: Likely benign calcification at the R upper inner quadrant. 08/29/2022: 40-month follow-up, R mammogram, stable, next imaging recommended for 02-2023 bilateral diagnostic MMG Patient clearly has nipple bleeding.  No evidence of infection, no recent injury. Plan: Diagnostic mammograms, ultrasound, surgical referral. Instructions written in English and discussed in Spanish   RUQ abdominal pain: See LOV, has  not pursue CT abdomen and pelvis, recommend to do.

## 2022-12-20 NOTE — Patient Instructions (Signed)
Will refer you for a mammogram and possibly ultrasound  Will refer you to a breast surgeon  Call anytime if symptoms get worse

## 2022-12-21 NOTE — Assessment & Plan Note (Signed)
Nipple discharge, R: Chart is reviewed. 02/12/2022: Abnormal mammogram 02/27/2022: Likely benign calcification at the R upper inner quadrant. 08/29/2022: 9-month follow-up, R mammogram, stable, next imaging recommended for 02-2023 bilateral diagnostic MMG Patient clearly has nipple bleeding.  No evidence of infection, no recent injury. Plan: Diagnostic mammograms, ultrasound, surgical referral. Instructions written in English and discussed in Spanish RUQ abdominal pain: See LOV, has not pursue CT abdomen and pelvis, recommend to do.

## 2022-12-27 ENCOUNTER — Other Ambulatory Visit: Payer: Medicare HMO

## 2022-12-31 ENCOUNTER — Other Ambulatory Visit: Payer: Self-pay | Admitting: Internal Medicine

## 2022-12-31 ENCOUNTER — Ambulatory Visit (INDEPENDENT_AMBULATORY_CARE_PROVIDER_SITE_OTHER): Payer: Medicare HMO | Admitting: Physician Assistant

## 2022-12-31 ENCOUNTER — Ambulatory Visit
Admission: RE | Admit: 2022-12-31 | Discharge: 2022-12-31 | Disposition: A | Discharge status: Home / Self Care | Payer: Medicare HMO | Source: Ambulatory Visit | Attending: Internal Medicine | Admitting: Internal Medicine

## 2022-12-31 ENCOUNTER — Encounter: Payer: Self-pay | Admitting: Physician Assistant

## 2022-12-31 ENCOUNTER — Other Ambulatory Visit (HOSPITAL_BASED_OUTPATIENT_CLINIC_OR_DEPARTMENT_OTHER): Payer: Self-pay

## 2022-12-31 VITALS — BP 142/86 | HR 72 | Temp 98.2°F | Resp 20 | Wt 167.8 lb

## 2022-12-31 DIAGNOSIS — R051 Acute cough: Secondary | ICD-10-CM

## 2022-12-31 DIAGNOSIS — R921 Mammographic calcification found on diagnostic imaging of breast: Secondary | ICD-10-CM

## 2022-12-31 DIAGNOSIS — N6341 Unspecified lump in right breast, subareolar: Secondary | ICD-10-CM | POA: Diagnosis not present

## 2022-12-31 DIAGNOSIS — J209 Acute bronchitis, unspecified: Secondary | ICD-10-CM

## 2022-12-31 DIAGNOSIS — N6310 Unspecified lump in the right breast, unspecified quadrant: Secondary | ICD-10-CM

## 2022-12-31 DIAGNOSIS — N6452 Nipple discharge: Secondary | ICD-10-CM | POA: Diagnosis not present

## 2022-12-31 MED ORDER — AMOXICILLIN 875 MG PO TABS
875.0000 mg | ORAL_TABLET | Freq: Two times a day (BID) | ORAL | 0 refills | Status: AC
Start: 2022-12-31 — End: 2023-01-07
  Filled 2022-12-31: qty 14, 7d supply, fill #0

## 2022-12-31 MED ORDER — HYDROCOD POLI-CHLORPHE POLI ER 10-8 MG/5ML PO SUER
5.0000 mL | Freq: Two times a day (BID) | ORAL | 0 refills | Status: DC | PRN
Start: 2022-12-31 — End: 2023-02-24
  Filled 2022-12-31: qty 115, 12d supply, fill #0

## 2022-12-31 MED ORDER — PREDNISONE 20 MG PO TABS
20.0000 mg | ORAL_TABLET | Freq: Every day | ORAL | 0 refills | Status: DC
Start: 2022-12-31 — End: 2023-03-12
  Filled 2022-12-31: qty 5, 5d supply, fill #0

## 2022-12-31 NOTE — Progress Notes (Signed)
Established patient visit   Patient: Lauren Hodge   DOB: 06-07-1955   67 y.o. Female  MRN: [id number] Visit Date: 12/31/2022  Today's healthcare provider: Alfredia Ferguson, PA-C  Cc. Cough, chest congestion x 1 week  Subjective    HPI  Pt presents today with her husband. Reports 1 week of a productive cough w/ green mucous, chest congestion, hearing crackles in her lungs, and shortness of breath. Denies fevers. Taking mucinex otc.   Medications: Outpatient Medications Prior to Visit  Medication Sig   ibuprofen (ADVIL) 600 MG tablet Take 1 tablet (600 mg total) by mouth every 6 to 8 hours as needed for pain   losartan (COZAAR) 50 MG tablet Take 1 tablet (50 mg total) by mouth daily.   metoprolol succinate (TOPROL-XL) 25 MG 24 hr tablet Take 1 tablet (25 mg total) by mouth daily with or immediately following a meal   pantoprazole (PROTONIX) 40 MG tablet Take 1 tablet (40 mg total) by mouth daily before breakfast.   VITAMIN D, CHOLECALCIFEROL, PO Take by mouth.   No facility-administered medications prior to visit.   Review of Systems  Constitutional:  Positive for fatigue. Negative for fever.  HENT:  Positive for congestion.   Respiratory:  Positive for cough and shortness of breath.   Cardiovascular:  Negative for chest pain and leg swelling.  Gastrointestinal:  Negative for abdominal pain.  Neurological:  Negative for dizziness and headaches.      Objective    BP (!) 142/86 (BP Location: Right Arm, Patient Position: Sitting, Cuff Size: Normal)   Pulse 72   Temp 98.2 F (36.8 C) (Oral)   Resp 20   Wt 167 lb 12.8 oz (76.1 kg)   SpO2 98%   BMI 31.71 kg/m   Physical Exam Constitutional:      General: She is awake.     Appearance: She is well-developed.  HENT:     Head: Normocephalic.  Eyes:     Conjunctiva/sclera: Conjunctivae normal.  Cardiovascular:     Rate and Rhythm: Normal rate and regular rhythm.     Heart sounds: Normal heart sounds.   Pulmonary:     Effort: Pulmonary effort is normal.     Breath sounds: Examination of the left-middle field reveals rhonchi. Examination of the left-lower field reveals rhonchi. Rhonchi present.  Skin:    General: Skin is warm.  Neurological:     Mental Status: She is alert and oriented to person, place, and time.  Psychiatric:        Attention and Perception: Attention normal.        Mood and Affect: Mood normal.        Speech: Speech normal.        Behavior: Behavior is cooperative.      No results found for any visits on 12/31/22.  Assessment & Plan     1. Acute cough 2. Acute bronchitis, unspecified organism Rhonchi heard L lung base Rx amoxicillin bid x 7 days  Prednisone 20 mg x 5 days  And tussionex for cough  Advised if no improvement by the end of the week to contact the office and would order a chest xray  - amoxicillin (AMOXIL) 875 MG tablet; Take 1 tablet (875 mg total) by mouth 2 (two) times daily for 7 days.  Dispense: 14 tablet; Refill: 0 - predniSONE (DELTASONE) 20 MG tablet; Take 1 tablet (20 mg total) by mouth daily with breakfast.  Dispense: 5 tablet; Refill: 0 -  chlorpheniramine-HYDROcodone (TUSSIONEX) 10-8 MG/5ML; Take 5 mLs by mouth every 12 (twelve) hours as needed for cough.  Dispense: 115 mL; Refill: 0   Return if symptoms worsen or fail to improve.      I, Alfredia Ferguson, PA-C have reviewed all documentation for this visit. The documentation on  12/31/22   for the exam, diagnosis, procedures, and orders are all accurate and complete.    Alfredia Ferguson, PA-C  [facility] at [facility] [phone] (phone) [phone] (fax)  [facility]

## 2023-01-09 ENCOUNTER — Ambulatory Visit
Admission: RE | Admit: 2023-01-09 | Discharge: 2023-01-09 | Disposition: A | Discharge status: Home / Self Care | Payer: Medicare HMO | Source: Ambulatory Visit | Attending: Internal Medicine | Admitting: Internal Medicine

## 2023-01-09 ENCOUNTER — Encounter: Payer: Self-pay | Admitting: Nurse Practitioner

## 2023-01-09 DIAGNOSIS — R112 Nausea with vomiting, unspecified: Secondary | ICD-10-CM | POA: Diagnosis not present

## 2023-01-09 DIAGNOSIS — K219 Gastro-esophageal reflux disease without esophagitis: Secondary | ICD-10-CM | POA: Diagnosis not present

## 2023-01-09 DIAGNOSIS — R1011 Right upper quadrant pain: Secondary | ICD-10-CM | POA: Diagnosis not present

## 2023-01-09 DIAGNOSIS — D259 Leiomyoma of uterus, unspecified: Secondary | ICD-10-CM | POA: Diagnosis not present

## 2023-01-09 MED ORDER — IOPAMIDOL (ISOVUE-300) INJECTION 61%
500.0000 mL | Freq: Once | INTRAVENOUS | Status: AC | PRN
  Administered 2023-01-09: 100 mL via INTRAVENOUS

## 2023-01-10 ENCOUNTER — Other Ambulatory Visit (HOSPITAL_BASED_OUTPATIENT_CLINIC_OR_DEPARTMENT_OTHER): Payer: Self-pay

## 2023-01-10 ENCOUNTER — Telehealth: Payer: Self-pay | Admitting: Internal Medicine

## 2023-01-10 ENCOUNTER — Ambulatory Visit
Admission: RE | Admit: 2023-01-10 | Discharge: 2023-01-10 | Disposition: A | Discharge status: Home / Self Care | Payer: Medicare HMO | Source: Ambulatory Visit | Attending: Internal Medicine | Admitting: Internal Medicine

## 2023-01-10 ENCOUNTER — Telehealth: Payer: Self-pay

## 2023-01-10 DIAGNOSIS — R921 Mammographic calcification found on diagnostic imaging of breast: Secondary | ICD-10-CM

## 2023-01-10 DIAGNOSIS — N6310 Unspecified lump in the right breast, unspecified quadrant: Secondary | ICD-10-CM

## 2023-01-10 DIAGNOSIS — N6315 Unspecified lump in the right breast, overlapping quadrants: Secondary | ICD-10-CM | POA: Diagnosis not present

## 2023-01-10 DIAGNOSIS — D0511 Intraductal carcinoma in situ of right breast: Secondary | ICD-10-CM | POA: Diagnosis not present

## 2023-01-10 DIAGNOSIS — L7632 Postprocedural hematoma of skin and subcutaneous tissue following other procedure: Secondary | ICD-10-CM | POA: Diagnosis not present

## 2023-01-10 HISTORY — PX: BREAST BIOPSY: SHX20

## 2023-01-10 MED ORDER — AMOXICILLIN-POT CLAVULANATE 500-125 MG PO TABS
1.0000 | ORAL_TABLET | Freq: Three times a day (TID) | ORAL | 0 refills | Status: DC
  Filled 2023-01-10: qty 21, 7d supply, fill #0

## 2023-01-10 NOTE — Telephone Encounter (Signed)
Received call from Erskine Squibb at [facility]- wanted to make PCP aware of CT results below.   IMPRESSION: 1. Suspect focal diverticulitis within the sigmoid colon. No associated abscess or evidence for perforation. 2. Benign LEFT adrenal myelolipoma, 1.8 centimeters. No specific imaging follow-up is needed for this finding. 3. Bilateral benign renal cysts. 4. Sliding-type hiatal hernia. 5. Prior cholecystectomy. 6. Uterine fibroids.   These results will be called to the ordering clinician or representative by the Radiologist Assistant, and communication documented in the PACS or [employer].

## 2023-01-10 NOTE — Telephone Encounter (Signed)
CT was ordered a 1424 for RUQ abdominal pain. Results: Suspect focal diverticulitis within the sigmoid colon. Nothing to account for the RUQ abdominal pain, except for perhaps sliding-type of hiatal hernia. Recently Rx amoxicillin for bronchitis. Plan:  Kaylyn.  Send Augmentin 500 mg tid #21 no refills.  Windell Moulding: Please advise patient about possible infection of the colon, take antibiotics as recommended. Call if has lower stomach pain.

## 2023-01-10 NOTE — Telephone Encounter (Signed)
See other telephone note.

## 2023-01-10 NOTE — Telephone Encounter (Signed)
Patient notified of results and new prescription. Also to call us if lower abdominal pain

## 2023-01-10 NOTE — Telephone Encounter (Signed)
Rx sent 

## 2023-01-13 ENCOUNTER — Other Ambulatory Visit: Payer: Self-pay | Admitting: Internal Medicine

## 2023-01-13 DIAGNOSIS — R921 Mammographic calcification found on diagnostic imaging of breast: Secondary | ICD-10-CM

## 2023-01-13 LAB — SURGICAL PATHOLOGY

## 2023-01-14 ENCOUNTER — Other Ambulatory Visit: Payer: Self-pay | Admitting: Internal Medicine

## 2023-01-14 ENCOUNTER — Other Ambulatory Visit: Payer: Self-pay | Admitting: General Surgery

## 2023-01-14 DIAGNOSIS — C50119 Malignant neoplasm of central portion of unspecified female breast: Secondary | ICD-10-CM | POA: Insufficient documentation

## 2023-01-14 DIAGNOSIS — C50111 Malignant neoplasm of central portion of right female breast: Secondary | ICD-10-CM

## 2023-01-14 DIAGNOSIS — R921 Mammographic calcification found on diagnostic imaging of breast: Secondary | ICD-10-CM

## 2023-01-15 ENCOUNTER — Other Ambulatory Visit: Payer: Medicare HMO

## 2023-01-15 NOTE — Progress Notes (Addendum)
Location of Breast Cancer:  Malignant neoplasm of centrel portion of right female breast, unspecified estrogen receptor status   Histology per Pathology Report:  01-10-23 FINAL DIAGNOSIS       1. Breast, right, needle core biopsy, 3 o'clock, retroareolar, ribbon clip :      DUCTAL CARCINOMA IN SITU, INTERMEDIATE TO HIGH GRADE, SOLID, CRIBRIFORM AND      CLINING TYPES      NECROSIS: PRESENT      CALCIFICATIONS: PRESENT      DCIS LENGTH: 0.45 CM      SEE NOTE       2. 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   Receptor Status: ER(100%), PR (85%), Her2-neu (), Ki-67()  Did patient present with symptoms (if so, please note symptoms) or was this found on screening mammography?: Bloody nipple drainage   Past/Anticipated interventions by surgeon, if any: Matthias Hughs, MD 01/14/2023  History of Present Illness: Lauren Hodge is a 67 y.o. female who is seen today as an office consultation at the request of Dr. Drue Novel for evaluation of Bloody nipple drainage .   Patient presents with right breast cancer diagnosed September 2024. Patient had spontaneous bloody nipple discharge. She underwent diagnostic imaging. Diagnostic mammogram showed 3.7 cm of linearly oriented calcifications in the retroareolar portion of the breast. There was also a 5 mm mass at 3:00 in the retroareolar location. There were some indeterminate calcifications in the upper inner breast. Patient  underwent core needle biopsy of the retroareolar calcifications in the retroareolar mass. This showed intermediate to high-grade ductal carcinoma in situ. Receptors were not yet complete. Patient has another biopsy planned presumably for the upper inner calcifications.  Patient does not have any personal or family history of breast cancer before this. Patient is accompanied by her husband. Telephone Spanish interpreter is utilized for the visit.  Family cancer history - maternal aunt had tongue cancer and maternal grandmother had vaginal cancer  Work pt works at home.   Assessment and Plan:   ICD-10-CM  1. Malignant neoplasm of central portion of right female breast, unspecified estrogen receptor status (CMS/HHS-HCC) C50.111 Ambulatory Referral to Oncology-Medical  Ambulatory Referral to Radiation Oncology    Patient has a new diagnosis of stage 0 right breast cancer. This appears to be amenable to a bracketed approach to a lumpectomy. Discussed this with the patient and her spouse. I reviewed that I would also refer the patient to medical and radiation oncology for additional treatment. I discussed that generally speaking radiation for 4 to 6 weeks is typical following lumpectomy and that often patients need to antihormonal pill treatment assuming that the hormone receptors are positive.  I discussed risk of the procedure including bleeding, infection, change in breast contour and nipple direction, possible breast  discomfort, possible heart or lung complications, possible need for additional procedures or surgeries, possible blood clot.  Patient wishes to proceed. I think the DCIS explains her bloody nipple discharge. We will see if the biopsy tomorrow affects her surgery in any way. Otherwise I would plan for the bracketed lumpectomy.   Past/Anticipated interventions by medical oncology, if any: none in EMR   Lymphedema issues, if any:  not applicable  Pain issues, if any:  intermittent  breast pain at biopsy site  SAFETY ISSUES: Prior radiation? no Pacemaker/ICD? no Possible current pregnancy?no Is the patient on methotrexate? no  Current Complaints / other details:  Pt to have breast surgery in three to four weeks per report. Wants to know overall plan.   Vitals:   01/20/23 0734  BP: (!) 193/88  Pulse: 66  Resp: 18  Temp: (!) 97.3 F (36.3 C)  SpO2: 100%

## 2023-01-19 NOTE — Progress Notes (Signed)
Radiation Oncology         [phone] ________________________________  Initial Outpatient Consultation  Name: Lauren Hodge MRN: [id number]  Date: 01/20/2023  DOB: 1955-07-18  CC:Paz, Nolon Rod, MD  Almond Lint, MD   REFERRING PHYSICIAN: Almond Lint, MD  DIAGNOSIS: D05.11    ICD-10-CM   1. Malignant neoplasm of central portion of right breast in female, estrogen receptor positive (HCC)  C50.111    Z17.0     2. Ductal carcinoma in situ (DCIS) of right breast  D05.11        Stage 0 (cTis (DCIS), cN0, cM0) Central Right Breast, Intermediate to high grade DCIS, ER+ / PR+ / Her2 not assessed  CHIEF COMPLAINT: Here to discuss management of right breast cancer  Her primary language is spanish and has the assistance of a spanish speaking interpreter.   HISTORY OF PRESENT ILLNESS::Lauren Hodge is a 67 y.o. female who presented earlier this month with spontaneous bloody discharge from the right nipple. She subsequently presented for a right breast diagnostic mammogram and right breast ultrasound on 12/31/22 which showed an indeterminate mass in the 3 o'clock right breast measuring 7 mm in the greatest extent, indeterminate calcifications in the retroareolar right breast, and similar appearing likely benign calcifications in the upper inner right breast. No evidence of right axillary lymphadenopathy was appreciated.    Of note: the patient had a follow-up right breast diagnostic mammogram on 08/29/22 which showed stable likely benign calcifications in the right breast.   Biopsies collected on 01/10/23 are detailed as follows:  -- Biopsy of the 3 o'clock right breast showed intermediate to high grade DCIS measuring 4.5 mm in the greatest linear extent of the sample, with necrosis and calcifications. ER status: 100% positive and PR status 85% positive, both with strong staining intensity; Her2 not assessed. No lymph nodes were examined.  -- Biopsy of the retroareolar right  breast calcifications also showed intermediate to high grade DCIS measuring 8 mm in the greatest linear extent of the sample, along with an intraductal papilloma measuring 4.5 mm in the greatest extent of the sample.  -- No lymph nodes were examined.   She was seen by Dr. Donell Beers in consultation on 01/14/23 and is interested in proceeding with breast conserving surgery.   Dr. Donell Beers will hold off on scheduling her surgery until she undergoes biopsies of the other upper inner right calcifications (biopsy date tbd).    Of note: she presented for a CT AP on 01/09/23 (for evaluation of abdominal pain and nausea x 1 month) which showed possible focal diverticulitis within the sigmoid colon without associated abscess or evidence of perforation. A benign left adrenal myelolipoma was also demonstrated, measuring 1.8 cm.   Lymphedema issues, if any:  not applicable  Pain issues, if any:  intermittent breast pain at biopsy site  SAFETY ISSUES: Prior radiation? no Pacemaker/ICD? no Possible current pregnancy?no Is the patient on methotrexate? no  Current Complaints / other details:  Pt to have breast surgery in three to four weeks per report. Wants to know overall plan.    PREVIOUS RADIATION THERAPY: No  PAST MEDICAL HISTORY:  has a past medical history of Carpal tunnel syndrome, Fracture of carpal bone (2018), GERD (gastroesophageal reflux disease), Hemorrhoids, Hiatal hernia (02/2019), History of left breast biopsy, HTN (hypertension), Hyperlipidemia, Insomnia, Osteoporosis, and Vitamin D deficiency.    PAST SURGICAL HISTORY: Past Surgical History:  Procedure Laterality Date   BREAST BIOPSY Left 2010   x2, 2010-2012 (no cancer)  BREAST BIOPSY Right 01/10/2023   [place] RT BREAST BX W LOC DEV 1ST LESION IMG BX SPEC US GUIDE 01/10/2023 [facility]   BREAST BIOPSY Right 01/10/2023   MM RT BREAST BX W LOC DEV 1ST LESION IMAGE BX SPEC STEREO GUIDE 01/10/2023 [facility]   BREAST EXCISIONAL  BIOPSY Left    CHOLECYSTECTOMY  2015   HEMORRHOIDECTOMY WITH HEMORRHOID BANDING  04/22/2017    FAMILY HISTORY: family history includes Cancer in her maternal aunt and maternal grandmother; Hypertension in her mother.  SOCIAL HISTORY:  reports that she quit smoking about 4 years ago. Her smoking use included cigarettes. She has never used smokeless tobacco. She reports that she does not currently use alcohol. She reports that she does not currently use drugs.  ALLERGIES: Patient has no known allergies.  MEDICATIONS:  Current Outpatient Medications  Medication Sig Dispense Refill   amoxicillin-clavulanate (AUGMENTIN) 500-125 MG tablet Take 1 tablet by mouth 3 (three) times daily. 21 tablet 0   chlorpheniramine-HYDROcodone (TUSSIONEX) 10-8 MG/5ML Take 5 mLs by mouth every 12 (twelve) hours as needed for cough. 115 mL 0   ibuprofen (ADVIL) 600 MG tablet Take 1 tablet (600 mg total) by mouth every 6 to 8 hours as needed for pain 16 tablet 0   metoprolol succinate (TOPROL-XL) 25 MG 24 hr tablet Take 1 tablet (25 mg total) by mouth daily with or immediately following a meal 90 tablet 1   predniSONE (DELTASONE) 20 MG tablet Take 1 tablet (20 mg total) by mouth daily with breakfast. 5 tablet 0   VITAMIN D, CHOLECALCIFEROL, PO Take by mouth.     losartan (COZAAR) 50 MG tablet Take 1 tablet (50 mg total) by mouth daily. 90 tablet 1   pantoprazole (PROTONIX) 40 MG tablet Take 1 tablet (40 mg total) by mouth daily before breakfast. 90 tablet 1   No current facility-administered medications for this encounter.    REVIEW OF SYSTEMS: As above in HPI.   PHYSICAL EXAM:  height is 5\' 1"  (1.549 m) and weight is 164 lb 4 oz (74.5 kg). Her temporal temperature is 97.3 F (36.3 C) (abnormal). Her blood pressure is 193/88 (abnormal) and her pulse is 66. Her respiration is 18 and oxygen saturation is 100%.   General: Alert and oriented, in no acute distress HEENT: Head is normocephalic. Extraocular movements  are intact. Oropharynx is clear. Neck: Neck is supple, no palpable cervical or supraclavicular lymphadenopathy. Heart: Regular in rate and rhythm   Chest: Clear to auscultation bilaterally, with no rhonchi, wheezes, or rales. Abdomen: Soft, nontender, nondistended, with no rigidity or guarding. Extremities: No cyanosis or edema. Lymphatics: see Neck Exam Skin: No concerning lesions. Musculoskeletal: symmetric strength and muscle tone throughout. Neurologic: Cranial nerves II through XII are grossly intact. No obvious focalities. Speech is fluent. Coordination is intact. Psychiatric: Judgment and insight are intact. Affect is appropriate. Breasts: Right breast - bilateral lower and central portions of breast are very firm, with some bruises, related to recent biopsies . No other palpable masses appreciated in the breasts or axillae bilaterally .   ECOG = 0  0 - Asymptomatic (Fully active, able to carry on all predisease activities without restriction)  1 - Symptomatic but completely ambulatory (Restricted in physically strenuous activity but ambulatory and able to carry out work of a light or sedentary nature. For example, light housework, office work)  2 - Symptomatic, <50% in bed during the day (Ambulatory and capable of all self care but unable to carry out  any work activities. Up and about more than 50% of waking hours)  3 - Symptomatic, >50% in bed, but not bedbound (Capable of only limited self-care, confined to bed or chair 50% or more of waking hours)  4 - Bedbound (Completely disabled. Cannot carry on any self-care. Totally confined to bed or chair)  5 - Death   Santiago Glad MM, Creech RH, Tormey DC, et al. [phone]). "Toxicity and response criteria of the [facility]". Am. Evlyn Clines. Oncol. 5 (6): 649-55   LABORATORY DATA:  Lab Results  Component Value Date   WBC 7.3 07/08/2022   HGB 12.9 07/08/2022   HCT 37.8 07/08/2022   MCV 86.7 07/08/2022   PLT 240.0  07/08/2022   CMP     Component Value Date/Time   NA 140 12/18/2022 0832   NA 141 01/25/2020 0000   K 4.1 12/18/2022 0832   CL 104 12/18/2022 0832   CO2 30 12/18/2022 0832   GLUCOSE 95 12/18/2022 0832   BUN 12 12/18/2022 0832   BUN 16 01/25/2020 0000   CREATININE 0.58 12/18/2022 0832   CALCIUM 9.4 12/18/2022 0832   PROT 7.4 07/08/2022 0841   ALBUMIN 4.0 07/08/2022 0841   AST 28 07/08/2022 0841   ALT 41 (H) 07/08/2022 0841   ALKPHOS 115 07/08/2022 0841   BILITOT 0.3 07/08/2022 0841   GFRNONAA 99 01/25/2020 0000   GFRAA 114 01/25/2020 0000         RADIOGRAPHY: [place] RT BREAST BX W LOC DEV 1ST LESION IMG BX SPEC US GUIDE  Addendum Date: 01/15/2023   ADDENDUM REPORT: 01/15/2023 10:00 ADDENDUM: PATHOLOGY revealed: Site 1. Breast, RIGHT, needle core biopsy, 3 o'clock, retroareolar, ribbon clip: DUCTAL CARCINOMA IN SITU, INTERMEDIATE TO HIGH GRADE, SOLID, CRIBRIFORM AND CLINING TYPES NECROSIS: PRESENT CALCIFICATIONS: PRESENT DCIS LENGTH: 0.45 CM SEE NOTE Pathology results are CONCORDANT with imaging findings, per Dr. Norva Pavlov. PATHOLOGY revealed: Site 2. Breast, RIGHT, needle core biopsy, retroareolar, coil clip: DUCTAL CARCINOMA IN SITU, INTERMEDIATE TO HIGH GRADE, CLINING AND CRIBRIFORM TYPES NECROSIS: NOT PRESENT CALCIFICATIONS: NOT PRESENT DCIS LENGTH: 0.8 CM OTHER FINDINGS: INTRADUCTAL PAPILLOMA (0.45 CM) SEE NOTE Pathology results are CONCORDANT with imaging findings, per Dr. Norva Pavlov. Pathology results and recommendations below were discussed with patient by telephone on 01/13/2023 using Spanish interpreter Sheria Lang [phone]. Patient reported biopsy site with slight tenderness at the site. Post biopsy care instructions were reviewed, questions were answered and my direct phone number was provided to patient. Patient was instructed to call the [facility] if any concerns or questions arise related to the biopsy. In light of the new diagnosis of ductal  carcinoma in situ, recommend stereotactic biopsy of calcifications in the MEDIAL and LATERAL aspects of the RIGHT breast. Additionally, recommend LEFT diagnostic mammogram and magnified views of calcifications in the central LEFT breast followed by stereotactic biopsy of LEFT breast calcifications. RECOMMENDATION: Surgical consultation has been arranged for patient to see Dr. Donell Beers at [facility] on 01/14/2023. Pathology results reported by Lynett Grimes, RN on 01/13/2023. Electronically Signed   By: Norva Pavlov M.D.   On: 01/15/2023 10:00   Result Date: 01/15/2023 CLINICAL DATA:  Patient presents for ultrasound-guided core biopsy of RIGHT breast mass. EXAM: ULTRASOUND GUIDED RIGHT BREAST CORE NEEDLE BIOPSY COMPARISON:  Previous exam(s). PROCEDURE: I met with the patient and we discussed the procedure of ultrasound-guided biopsy, including benefits and alternatives. We discussed the high likelihood of a successful procedure. We discussed the risks of the procedure,  including infection, bleeding, tissue injury, clip migration, and inadequate sampling. Informed written consent was given. The usual time-out protocol was performed immediately prior to the procedure. Lesion quadrant: 3 o'clock retroareolar region RIGHT breast Using sterile technique and 1% Lidocaine as local anesthetic, under direct ultrasound visualization, a 14 gauge spring-loaded device was used to perform biopsy of mass in the 3 o'clock retroareolar region of the RIGHT breast using a inferior to superior approach. At the conclusion of the procedure ribbon shaped tissue marker clip was deployed into the biopsy cavity. Follow up 2 view mammogram was performed and dictated separately. IMPRESSION: Ultrasound guided biopsy of RIGHT breast mass. No apparent complications. Electronically Signed: By: Norva Pavlov M.D. On: 01/10/2023 08:21   MM RT BREAST BX W LOC DEV 1ST LESION IMAGE BX SPEC STEREO GUIDE  Addendum Date: 01/15/2023    ADDENDUM REPORT: 01/15/2023 09:59 ADDENDUM: PATHOLOGY revealed: Site 1. Breast, RIGHT, needle core biopsy, 3 o'clock, retroareolar, ribbon clip: DUCTAL CARCINOMA IN SITU, INTERMEDIATE TO HIGH GRADE, SOLID, CRIBRIFORM AND CLINING TYPES NECROSIS: PRESENT CALCIFICATIONS: PRESENT DCIS LENGTH: 0.45 CM SEE NOTE Pathology results are CONCORDANT with imaging findings, per Dr. Norva Pavlov. PATHOLOGY revealed: Site 2. Breast, RIGHT, needle core biopsy, retroareolar, coil clip: DUCTAL CARCINOMA IN SITU, INTERMEDIATE TO HIGH GRADE, CLINING AND CRIBRIFORM TYPES NECROSIS: NOT PRESENT CALCIFICATIONS: NOT PRESENT DCIS LENGTH: 0.8 CM OTHER FINDINGS: INTRADUCTAL PAPILLOMA (0.45 CM) SEE NOTE Pathology results are CONCORDANT with imaging findings, per Dr. Norva Pavlov. Pathology results and recommendations below were discussed with patient by telephone on 01/13/2023 using Spanish interpreter Sheria Lang [phone]. Patient reported biopsy site with slight tenderness at the site. Post biopsy care instructions were reviewed, questions were answered and my direct phone number was provided to patient. Patient was instructed to call the [facility] of [facility] if any concerns or questions arise related to the biopsy. In light of the new diagnosis of ductal carcinoma in situ, recommend stereotactic biopsy of calcifications in the MEDIAL and LATERAL aspects of the RIGHT breast. Additionally, recommend LEFT diagnostic mammogram and magnified views of calcifications in the central LEFT breast followed by stereotactic biopsy of LEFT breast calcifications. RECOMMENDATION: Surgical consultation has been arranged for patient to see Dr. Donell Beers at [facility] on 01/14/2023. Pathology results reported by Lynett Grimes, RN on 01/13/2023. Electronically Signed   By: Norva Pavlov M.D.   On: 01/15/2023 09:59   Result Date: 01/15/2023 CLINICAL DATA:  Patient presents for stereotactic biopsy of RIGHT breast calcifications. EXAM:  RIGHT BREAST STEREOTACTIC CORE NEEDLE BIOPSY COMPARISON:  Previous exam(s). FINDINGS: The patient and I discussed the procedure of stereotactic-guided biopsy including benefits and alternatives. We discussed the high likelihood of a successful procedure. We discussed the risks of the procedure including infection, bleeding, tissue injury, clip migration, and inadequate sampling. Informed written consent was given. The usual time out protocol was performed immediately prior to the procedure. An interpreter was present for the ultrasound-guided and stereotactic guided biopsies. Using sterile technique and lidocaine and lidocaine with epinephrine as local anesthetic, under stereotactic guidance, a 9 gauge vacuum assisted device was used to perform core needle biopsy of calcifications in the retroareolar region of RIGHT breast using a craniocaudal approach. Specimen radiograph was performed showing calcifications in tissue samples. Specimens with calcifications are identified for pathology. Lesion quadrant: Central RIGHT breast At the conclusion of the procedure, coil shaped tissue marker clip was deployed into the biopsy cavity. Follow-up 2-view mammogram was performed and dictated separately. IMPRESSION: Stereotactic-guided biopsy of RIGHT breast  calcifications. No apparent complications. Electronically Signed: By: Norva Pavlov M.D. On: 01/10/2023 09:04   MM CLIP PLACEMENT RIGHT  Addendum Date: 01/10/2023   ADDENDUM REPORT: 01/10/2023 11:31 ADDENDUM: Small hematoma is developing at the more anterior biopsy site. A compression dressing was applied. Electronically Signed   By: Norva Pavlov M.D.   On: 01/10/2023 11:31   Result Date: 01/10/2023 CLINICAL DATA:  Status post biopsies of the right breast. EXAM: 3D DIAGNOSTIC RIGHT MAMMOGRAM POST ULTRASOUND and STEREOTACTIC BIOPSIES OF THE RIGHT BREAST COMPARISON:  Previous exam(s). FINDINGS: 3D Mammographic images were obtained following ultrasound guided biopsy  of RIGHT BREAST and placement of a ribbon clip . The biopsy marking clip is in expected position at the site of biopsy. Following biopsy of calcifications in the retroareolar right breast, a coil clip was placed, and is identified in the retroareolar right breast. The clips are 1.6 cm apart on the ML view. IMPRESSION: Tissue marker clips are in the expected locations after biopsy. Final Assessment: Post Procedure Mammograms for Marker Placement BI-RADS CATEGORY  74M: Post-Procedure Mammogram for Marker Placement Electronically Signed: By: Norva Pavlov M.D. On: 01/10/2023 09:17   CT ABDOMEN PELVIS W CONTRAST  Result Date: 01/10/2023 CLINICAL DATA:  Rib QUADRANT abdominal pain. Intermittent nausea. Symptoms for 1 month. History GERD, cholecystectomy. EXAM: CT ABDOMEN AND PELVIS WITH CONTRAST TECHNIQUE: Multidetector CT imaging of the abdomen and pelvis was performed using the standard protocol following bolus administration of intravenous contrast. RADIATION DOSE REDUCTION: This exam was performed according to the departmental dose-optimization program which includes automated exposure control, adjustment of the mA and/or kV according to patient size and/or use of iterative reconstruction technique. CONTRAST:  ISOVUE-300 IOPAMIDOL (ISOVUE-300) INJECTION 61% COMPARISON:  Prior DXA exam 1822 FINDINGS: Lower chest: No acute abnormality. Hepatobiliary: Prior cholecystectomy. Liver is normal in appearance. Ectasia of the common bile duct is consistent with prior cholecystectomy. Pancreas: Unremarkable. No pancreatic ductal dilatation or surrounding inflammatory changes. Spleen: Normal in size without focal abnormality. Adrenals/Urinary Tract: RIGHT adrenal gland is normal. A LEFT adrenal myelolipoma measures 1.8 centimeters. A simple cyst in the UPPER pole the RIGHT kidney is 1.9 centimeters. A simple cyst in the midpole LEFT kidney is 1.3 centimeters. No further imaging is needed. There is hydronephrosis. No  renal calculi or ureteral abnormality. The bladder and visualized portion of the urethra are normal. Stomach/Bowel: Small sliding-type hiatal hernia is present. Stomach is otherwise unremarkable. Small bowel loops are normal in caliber and wall thickness. Appendix is normal. There are rare diverticula appear within the sigmoid colon, there is a diverticulum associated with mesenteric stranding, raising the question focal diverticulitis. There is no extraluminal gas or abscess in this region. (Image 67 of series 4, image 65 of series 2, and image 58 of series 5). Vascular/Lymphatic: There is minimal atherosclerotic calcification the abdominal aorta. No retroperitoneal or mesenteric adenopathy. Reproductive: Uterus is present. Anterior and fundal fibroids are present, largest measuring 1.7 centimeters. No adnexal mass. Other: No ascites.  Abdominal wall is unremarkable. Musculoskeletal: No acute abnormality. Scattered bone islands are present within the pelvis and proximal LEFT femur. Largest is within the LEFT ilium and is stable compared to prior DXA exam in 2022. No suspicious lytic blastic lesions. IMPRESSION: 1. Suspect focal diverticulitis within the sigmoid colon. No associated abscess or evidence for perforation. 2. Benign LEFT adrenal myelolipoma, 1.8 centimeters. No specific imaging follow-up is needed for this finding. 3. Bilateral benign renal cysts. 4. Sliding-type hiatal hernia. 5. Prior cholecystectomy. 6. Uterine fibroids.  These results will be called to the ordering clinician or representative by the Radiologist Assistant, and communication documented in the PACS or [employer]. Electronically Signed   By: Norva Pavlov M.D.   On: 01/10/2023 09:06   MM 3D DIAGNOSTIC MAMMOGRAM UNILATERAL RIGHT BREAST  Result Date: 12/31/2022 CLINICAL DATA:  Patient presents for unilateral spontaneous bloody nipple discharge. EXAM: DIGITAL DIAGNOSTIC UNILATERAL RIGHT MAMMOGRAM WITH TOMOSYNTHESIS AND CAD;  ULTRASOUND RIGHT BREAST LIMITED TECHNIQUE: Right digital diagnostic mammography and breast tomosynthesis was performed. The images were evaluated with computer-aided detection. ; Targeted ultrasound examination of the right breast was performed COMPARISON:  Previous exam(s). ACR Breast Density Category c: The breasts are heterogeneously dense, which may obscure small masses. FINDINGS: Within the retroareolar right breast there is a 3.7 cm linearly oriented group of round and punctate calcifications. Stable punctate calcifications within the upper inner right breast. No suspicious masses identified within the right breast. On physical exam, the right nipple is mildly inverted. Targeted ultrasound is performed, showing a few dilated ducts within the retroareolar right breast. There is a 5 x 5 x 7 mm solid and cystic mass with associated calcifications right breast 3 o'clock position retroareolar location. No right axillary adenopathy. IMPRESSION: 1. Indeterminate right breast mass 3 o'clock position. 2. Indeterminate retroareolar right breast calcifications. 3. Similar-appearing probably benign right breast calcifications. 4. Pathologic spontaneous bloody right nipple discharge. RECOMMENDATION: 1. Ultrasound-guided core needle biopsy right breast mass 3 o'clock position. 2. Stereotactic guided core needle biopsy retroareolar right breast calcifications. 3. If both of these are benign in etiology, recommend bilateral breast MRI and surgical consultation for bloody right nipple discharge. 4. Additionally, patient will need appropriate continued follow-up of the previously evaluated probably benign right breast calcifications. If any of the above biopsies are malignant, biopsy of these calcifications is warranted. I have discussed the findings and recommendations with the patient. If applicable, a reminder letter will be sent to the patient regarding the next appointment. BI-RADS CATEGORY  4: Suspicious. Electronically  Signed   By: Annia Belt M.D.   On: 12/31/2022 12:02   [place] LIMITED ULTRASOUND INCLUDING AXILLA RIGHT BREAST  Result Date: 12/31/2022 CLINICAL DATA:  Patient presents for unilateral spontaneous bloody nipple discharge. EXAM: DIGITAL DIAGNOSTIC UNILATERAL RIGHT MAMMOGRAM WITH TOMOSYNTHESIS AND CAD; ULTRASOUND RIGHT BREAST LIMITED TECHNIQUE: Right digital diagnostic mammography and breast tomosynthesis was performed. The images were evaluated with computer-aided detection. ; Targeted ultrasound examination of the right breast was performed COMPARISON:  Previous exam(s). ACR Breast Density Category c: The breasts are heterogeneously dense, which may obscure small masses. FINDINGS: Within the retroareolar right breast there is a 3.7 cm linearly oriented group of round and punctate calcifications. Stable punctate calcifications within the upper inner right breast. No suspicious masses identified within the right breast. On physical exam, the right nipple is mildly inverted. Targeted ultrasound is performed, showing a few dilated ducts within the retroareolar right breast. There is a 5 x 5 x 7 mm solid and cystic mass with associated calcifications right breast 3 o'clock position retroareolar location. No right axillary adenopathy. IMPRESSION: 1. Indeterminate right breast mass 3 o'clock position. 2. Indeterminate retroareolar right breast calcifications. 3. Similar-appearing probably benign right breast calcifications. 4. Pathologic spontaneous bloody right nipple discharge. RECOMMENDATION: 1. Ultrasound-guided core needle biopsy right breast mass 3 o'clock position. 2. Stereotactic guided core needle biopsy retroareolar right breast calcifications. 3. If both of these are benign in etiology, recommend bilateral breast MRI and surgical consultation for bloody right nipple discharge. 4.  Additionally, patient will need appropriate continued follow-up of the previously evaluated probably benign right breast  calcifications. If any of the above biopsies are malignant, biopsy of these calcifications is warranted. I have discussed the findings and recommendations with the patient. If applicable, a reminder letter will be sent to the patient regarding the next appointment. BI-RADS CATEGORY  4: Suspicious. Electronically Signed   By: Annia Belt M.D.   On: 12/31/2022 12:02      IMPRESSION/PLAN: DCIS RIGHT BREAST  She is hopeful to remain a candidate for lumpectomy. Biopsies are not yet complete to gauge extent of disease   It was a pleasure meeting the patient today. We discussed the risks, benefits, and side effects of radiotherapy. If lumpectomy is performed I recommend post operative radiotherapy to the Right Breast to reduce her risk of locoregional recurrence.  We discussed that radiation would take approximately 4 weeks to complete and that I would give the patient a few weeks to heal following surgery before starting treatment planning. We spoke about acute effects including skin irritation and fatigue as well as much less common late effects including internal organ injury or irritation. We spoke about the latest technology that is used to minimize the risk of late effects for patients undergoing radiotherapy to the breast or chest wall. No guarantees of treatment were given. The patient is enthusiastic about proceeding with treatment. I look forward to participating in the patient's care.  I will await her referral back to me for postoperative follow-up and eventual CT simulation/treatment planning.  On date of service, in total, I spent 35 minutes on this encounter. Patient was seen in person. Note signed after encounter date; minutes pertain to date of service, only.    __________________________________________   Lonie Peak, MD  This document serves as a record of services personally performed by Lonie Peak, MD. It was created on her behalf by Neena Rhymes, a trained medical scribe. The  creation of this record is based on the scribe's personal observations and the provider's statements to them. This document has been checked and approved by the attending provider.

## 2023-01-20 ENCOUNTER — Encounter: Payer: Self-pay | Admitting: Radiation Oncology

## 2023-01-20 ENCOUNTER — Other Ambulatory Visit (HOSPITAL_BASED_OUTPATIENT_CLINIC_OR_DEPARTMENT_OTHER): Payer: Self-pay

## 2023-01-20 ENCOUNTER — Other Ambulatory Visit: Payer: Self-pay | Admitting: Internal Medicine

## 2023-01-20 ENCOUNTER — Ambulatory Visit
Admission: RE | Admit: 2023-01-20 | Discharge: 2023-01-20 | Disposition: A | Discharge status: Home / Self Care | Payer: Medicare HMO | Source: Ambulatory Visit | Attending: Radiation Oncology | Admitting: Radiation Oncology

## 2023-01-20 VITALS — BP 193/88 | HR 66 | Temp 97.3°F | Resp 18 | Ht 61.0 in | Wt 164.2 lb

## 2023-01-20 DIAGNOSIS — N281 Cyst of kidney, acquired: Secondary | ICD-10-CM | POA: Diagnosis not present

## 2023-01-20 DIAGNOSIS — K449 Diaphragmatic hernia without obstruction or gangrene: Secondary | ICD-10-CM | POA: Diagnosis not present

## 2023-01-20 DIAGNOSIS — Z170 Estrogen receptor positive status [ER+]: Secondary | ICD-10-CM

## 2023-01-20 DIAGNOSIS — E785 Hyperlipidemia, unspecified: Secondary | ICD-10-CM | POA: Insufficient documentation

## 2023-01-20 DIAGNOSIS — C50111 Malignant neoplasm of central portion of right female breast: Secondary | ICD-10-CM | POA: Insufficient documentation

## 2023-01-20 DIAGNOSIS — I10 Essential (primary) hypertension: Secondary | ICD-10-CM | POA: Insufficient documentation

## 2023-01-20 DIAGNOSIS — M810 Age-related osteoporosis without current pathological fracture: Secondary | ICD-10-CM | POA: Insufficient documentation

## 2023-01-20 DIAGNOSIS — N6001 Solitary cyst of right breast: Secondary | ICD-10-CM | POA: Diagnosis not present

## 2023-01-20 DIAGNOSIS — Z87891 Personal history of nicotine dependence: Secondary | ICD-10-CM | POA: Diagnosis not present

## 2023-01-20 DIAGNOSIS — K219 Gastro-esophageal reflux disease without esophagitis: Secondary | ICD-10-CM | POA: Diagnosis not present

## 2023-01-20 DIAGNOSIS — E559 Vitamin D deficiency, unspecified: Secondary | ICD-10-CM | POA: Diagnosis not present

## 2023-01-20 DIAGNOSIS — Z79899 Other long term (current) drug therapy: Secondary | ICD-10-CM | POA: Diagnosis not present

## 2023-01-20 DIAGNOSIS — D0511 Intraductal carcinoma in situ of right breast: Secondary | ICD-10-CM

## 2023-01-20 MED ORDER — LOSARTAN POTASSIUM 50 MG PO TABS
50.0000 mg | ORAL_TABLET | Freq: Every day | ORAL | 1 refills | Status: DC
  Filled 2023-01-20 – 2023-01-27 (×2): qty 90, 90d supply, fill #0
  Filled 2023-05-02: qty 90, 90d supply, fill #1

## 2023-01-20 MED ORDER — PANTOPRAZOLE SODIUM 40 MG PO TBEC
40.0000 mg | DELAYED_RELEASE_TABLET | Freq: Every day | ORAL | 1 refills | Status: DC
  Filled 2023-01-20: qty 90, 90d supply, fill #0

## 2023-01-21 DIAGNOSIS — D0511 Intraductal carcinoma in situ of right breast: Secondary | ICD-10-CM | POA: Insufficient documentation

## 2023-01-22 ENCOUNTER — Ambulatory Visit
Admission: RE | Admit: 2023-01-22 | Discharge: 2023-01-22 | Disposition: A | Discharge status: Home / Self Care | Payer: Medicare HMO | Source: Ambulatory Visit | Attending: Internal Medicine | Admitting: Internal Medicine

## 2023-01-22 DIAGNOSIS — R921 Mammographic calcification found on diagnostic imaging of breast: Secondary | ICD-10-CM

## 2023-01-22 DIAGNOSIS — D0512 Intraductal carcinoma in situ of left breast: Secondary | ICD-10-CM | POA: Diagnosis not present

## 2023-01-22 DIAGNOSIS — N6321 Unspecified lump in the left breast, upper outer quadrant: Secondary | ICD-10-CM | POA: Diagnosis not present

## 2023-01-22 DIAGNOSIS — N6311 Unspecified lump in the right breast, upper outer quadrant: Secondary | ICD-10-CM | POA: Diagnosis not present

## 2023-01-22 DIAGNOSIS — N6312 Unspecified lump in the right breast, upper inner quadrant: Secondary | ICD-10-CM | POA: Diagnosis not present

## 2023-01-22 DIAGNOSIS — D0511 Intraductal carcinoma in situ of right breast: Secondary | ICD-10-CM | POA: Diagnosis not present

## 2023-01-22 DIAGNOSIS — N6011 Diffuse cystic mastopathy of right breast: Secondary | ICD-10-CM | POA: Diagnosis not present

## 2023-01-22 HISTORY — PX: BREAST BIOPSY: SHX20

## 2023-01-23 LAB — SURGICAL PATHOLOGY

## 2023-01-27 ENCOUNTER — Other Ambulatory Visit (HOSPITAL_BASED_OUTPATIENT_CLINIC_OR_DEPARTMENT_OTHER): Payer: Self-pay

## 2023-01-28 ENCOUNTER — Other Ambulatory Visit (HOSPITAL_BASED_OUTPATIENT_CLINIC_OR_DEPARTMENT_OTHER): Payer: Self-pay

## 2023-01-29 ENCOUNTER — Encounter: Payer: Self-pay | Admitting: Genetic Counselor

## 2023-01-29 ENCOUNTER — Inpatient Hospital Stay: Payer: Medicare HMO

## 2023-01-29 ENCOUNTER — Other Ambulatory Visit: Payer: Self-pay | Admitting: Hematology and Oncology

## 2023-01-29 ENCOUNTER — Telehealth: Payer: Self-pay | Admitting: Genetic Counselor

## 2023-01-29 ENCOUNTER — Inpatient Hospital Stay: Payer: Medicare HMO | Attending: Hematology and Oncology | Admitting: Hematology and Oncology

## 2023-01-29 ENCOUNTER — Other Ambulatory Visit: Payer: Self-pay | Admitting: General Surgery

## 2023-01-29 VITALS — BP 175/72 | HR 74 | Temp 98.0°F | Resp 18 | Ht 61.0 in | Wt 167.7 lb

## 2023-01-29 DIAGNOSIS — Z87891 Personal history of nicotine dependence: Secondary | ICD-10-CM | POA: Insufficient documentation

## 2023-01-29 DIAGNOSIS — D0512 Intraductal carcinoma in situ of left breast: Secondary | ICD-10-CM

## 2023-01-29 DIAGNOSIS — D0511 Intraductal carcinoma in situ of right breast: Secondary | ICD-10-CM

## 2023-01-29 DIAGNOSIS — Z170 Estrogen receptor positive status [ER+]: Secondary | ICD-10-CM | POA: Diagnosis not present

## 2023-01-29 DIAGNOSIS — Z79899 Other long term (current) drug therapy: Secondary | ICD-10-CM | POA: Diagnosis not present

## 2023-01-29 DIAGNOSIS — C50111 Malignant neoplasm of central portion of right female breast: Secondary | ICD-10-CM

## 2023-01-29 LAB — GENETIC SCREENING ORDER

## 2023-01-29 NOTE — Telephone Encounter (Signed)
Dr. Al Pimple reached out asking if she could send this patient to the lab and we see her on the back end for genetic counseling.  I placed TRF and ordered [facility]Exp[facility]. Results will go into the S drive when they return.

## 2023-01-29 NOTE — Progress Notes (Signed)
[facility] CONSULT NOTE  Patient Care Team: Wanda Plump, MD as PCP - General (Internal Medicine) Rachael Fee, MD as Attending Physician (Gastroenterology)  CHIEF COMPLAINTS/PURPOSE OF CONSULTATION:  Newly diagnosed breast cancer  HISTORY OF PRESENTING ILLNESS:  Lauren Hodge 67 y.o. female is here because of recent diagnosis of right   I reviewed her records extensively and collaborated the history with the patient.  SUMMARY OF ONCOLOGIC HISTORY: Oncology History  Ductal carcinoma in situ (DCIS) of right breast  12/31/2022 Mammogram   Patient had diagnostic mammogram for unilateral spontaneous bloody nipple discharge and this showed 3.7 cm linearly oriented group of round and punctate calcs in the retroareolar right breast there is also a 5 x 5 x 7 mm solid and cystic mass with associated calcs right breast 3:00 retroareolar location   01/10/2023 Pathology Results   Right breast needle core biopsy at 3:00 retroareolar showed DCIS intermediate to high-grade, right breast needle core biopsy retroareolar coil clip showed DCIS intermediate to high-grade, prognostic showed ER 100% positive strong staining, PR 85% positive strong staining   01/21/2023 Initial Diagnosis   Ductal carcinoma in situ (DCIS) of right breast   01/22/2023 Mammogram   Mammogram of the left breast showed 3.3 cm group of indeterminate calcs in the upper outer anterior left breast   01/22/2023 Pathology Results   Right breast needle core biopsy upper inner clip showed benign breast tissue with fibrocystic change and focal columnar cell change involving mild adenosis and associated calcs.  Right breast upper outer Venus clip showed benign breast tissue with extensive sclerosing adenosis and prominent fibrocystic change with associated calcs and a focal incidental radial scar.  Left breast needle core biopsy upper outer anterior showed DCIS solid and cribriform type, nuclear grade 2, prognostic from  the DCIS showed ER 95% positive strong staining PR 60% positive strong staining    She is here with her husband and a certified Spanish interpreter.  At baseline she is healthy except for diagnosis of high blood pressure and osteoporosis.  No known family history of breast cancer, ovarian cancer, pancreatic cancer or melanoma.  Rest of the pertinent 10 point ROS reviewed and negative  MEDICAL HISTORY:  Past Medical History:  Diagnosis Date   Carpal tunnel syndrome    Fracture of carpal bone 2018   bilateral   GERD (gastroesophageal reflux disease)    Hemorrhoids    Hiatal hernia 02/2019   seen on EGD    History of left breast biopsy    Benign   HTN (hypertension)    Hyperlipidemia    Insomnia    Osteoporosis    Vitamin D deficiency     SURGICAL HISTORY: Past Surgical History:  Procedure Laterality Date   BREAST BIOPSY Left 2010   x2, 2010-2012 (no cancer)   BREAST BIOPSY Right 01/10/2023   [place] RT BREAST BX W LOC DEV 1ST LESION IMG BX SPEC US GUIDE 01/10/2023 [facility]   BREAST BIOPSY Right 01/10/2023   MM RT BREAST BX W LOC DEV 1ST LESION IMAGE BX SPEC STEREO GUIDE 01/10/2023 [facility]   BREAST BIOPSY Right 01/22/2023   MM RT BREAST BX W LOC DEV 1ST LESION IMAGE BX SPEC STEREO GUIDE 01/22/2023 [facility]   BREAST BIOPSY Right 01/22/2023   MM RT BREAST BX W LOC DEV EA AD LESION IMG BX SPEC STEREO GUIDE 01/22/2023 [facility]   BREAST BIOPSY Left 01/22/2023   MM LT BREAST BX W LOC DEV 1ST  LESION IMAGE BX SPEC STEREO GUIDE 01/22/2023 [facility]   BREAST EXCISIONAL BIOPSY Left    CHOLECYSTECTOMY  2015   HEMORRHOIDECTOMY WITH HEMORRHOID BANDING  04/22/2017    SOCIAL HISTORY: Social History   Socioeconomic History   Marital status: Married    Spouse name: Not on file   Number of children: 2   Years of education: Not on file   Highest education level: Not on file  Occupational History   Occupation: retired   Tobacco Use   Smoking status:  Former    Current packs/day: 0.00    Types: Cigarettes    Quit date: 08/25/2018    Years since quitting: 4.4   Smokeless tobacco: Never   Tobacco comments:    used to smoke rarely   Substance and Sexual Activity   Alcohol use: Not Currently    Comment: occasional   Drug use: Not Currently   Sexual activity: Not Currently  Other Topics Concern   Not on file  Social History Narrative   From [place] from [place] 2019   Social Determinants of Health   Financial Resource Strain: Not on file  Food Insecurity: No Food Insecurity (07/27/2020)   Received from [facility], [facility]   Hunger Vital Sign    Worried About Running Out of Food in the Last Year: Never true    Ran Out of Food in the Last Year: Never true  Transportation Needs: No Transportation Needs (01/20/2023)   PRAPARE - Administrator, Civil Service (Medical): No    Lack of Transportation (Non-Medical): No  Physical Activity: Not on file  Stress: Not on file  Social Connections: Unknown (09/04/2021)   Received from [facility], [facility]   Social Network    Social Network: Not on file  Intimate Partner Violence: Not At Risk (01/20/2023)   Humiliation, Afraid, Rape, and Kick questionnaire    Fear of Current or Ex-Partner: No    Emotionally Abused: No    Physically Abused: No    Sexually Abused: No    FAMILY HISTORY: Family History  Problem Relation Age of Onset   Hypertension Mother    Vaginal cancer Maternal Grandmother    Tongue cancer Maternal Aunt    CAD Neg Hx    Diabetes Neg Hx    Colon cancer Neg Hx    Breast cancer Neg Hx     ALLERGIES:  has No Known Allergies.  MEDICATIONS:  Current Outpatient Medications  Medication Sig Dispense Refill   amoxicillin-clavulanate (AUGMENTIN) 500-125 MG tablet Take 1 tablet by mouth 3 (three) times daily. 21 tablet 0   chlorpheniramine-HYDROcodone (TUSSIONEX) 10-8 MG/5ML Take 5 mLs by mouth every 12 (twelve) hours as needed for cough. 115 mL 0    ibuprofen (ADVIL) 600 MG tablet Take 1 tablet (600 mg total) by mouth every 6 to 8 hours as needed for pain 16 tablet 0   losartan (COZAAR) 50 MG tablet Take 1 tablet (50 mg total) by mouth daily. 90 tablet 1   metoprolol succinate (TOPROL-XL) 25 MG 24 hr tablet Take 1 tablet (25 mg total) by mouth daily with or immediately following a meal 90 tablet 1   pantoprazole (PROTONIX) 40 MG tablet Take 1 tablet (40 mg total) by mouth daily before breakfast. 90 tablet 1   predniSONE (DELTASONE) 20 MG tablet Take 1 tablet (20 mg total) by mouth daily with breakfast. 5 tablet 0   VITAMIN D, CHOLECALCIFEROL, PO Take by mouth.  No current facility-administered medications for this visit.    REVIEW OF SYSTEMS:   Constitutional: Denies fevers, chills or abnormal night sweats Eyes: Denies blurriness of vision, double vision or watery eyes Ears, nose, mouth, throat, and face: Denies mucositis or sore throat Respiratory: Denies cough, dyspnea or wheezes Cardiovascular: Denies palpitation, chest discomfort or lower extremity swelling Gastrointestinal:  Denies nausea, heartburn or change in bowel habits Skin: Denies abnormal skin rashes Lymphatics: Denies new lymphadenopathy or easy bruising Neurological:Denies numbness, tingling or new weaknesses Behavioral/Psych: Mood is stable, no new changes  Breast: Denies any palpable lumps or discharge All other systems were reviewed with the patient and are negative.  PHYSICAL EXAMINATION: ECOG PERFORMANCE STATUS: 0 - Asymptomatic  Vitals:   01/29/23 1121  BP: (!) 175/72  Pulse: 74  Resp: 18  Temp: 98 F (36.7 C)  SpO2: 100%   Filed Weights   01/29/23 1121  Weight: 167 lb 11.2 oz (76.1 kg)    GENERAL:alert, no distress and comfortable LYMPH:  no palpable lymphadenopathy in the cervical, axillary  BREAST: Bilateral breast inspected and palpated, hematomas noted.  No definitive palpable mass.  Bilateral nipple discharge, clear discharge noted.  No  regional adenopathy.  LABORATORY DATA:  I have reviewed the data as listed Lab Results  Component Value Date   WBC 7.3 07/08/2022   HGB 12.9 07/08/2022   HCT 37.8 07/08/2022   MCV 86.7 07/08/2022   PLT 240.0 07/08/2022   Lab Results  Component Value Date   NA 140 12/18/2022   K 4.1 12/18/2022   CL 104 12/18/2022   CO2 30 12/18/2022    RADIOGRAPHIC STUDIES: I have personally reviewed the radiological reports and agreed with the findings in the report.  ASSESSMENT AND PLAN:  Ductal carcinoma in situ (DCIS) of right breast This is a very pleasant 67 year old female patient with newly diagnosed bilateral DCIS ER/PR positive referred to medical oncology for additional recommendations.  Her husband and certified Spanish interpreter were present during the conversation.  Pathology review: I discussed with the patient the difference between DCIS and invasive breast cancer. It is considered a precancerous lesion. DCIS is classified as a Stage 0 breast cancer. It is generally detected through mammograms as calcifications. We discussed the significance of grades and its impact on prognosis. We also discussed the importance of ER and PR receptors and their implications to adjuvant treatment options. Prognosis of DCIS dependence on grade and degree of comedo necrosis. It is anticipated that if not treated, 20-30% of DCIS can develop into invasive breast cancer.  Recommendation: 1. Breast conserving surgery 2. Followed by adjuvant radiation therapy 3. Followed by antiestrogen therapy with tamoxifen/aromatase inhibitors based on menopausal status 5 years  Patient however was hoping for bilateral mastectomy since she has to go through bilateral surgery.  I have conveyed this request to Dr. Donell Beers.  She will get her MRI done, follow-up with Dr. Donell Beers and once again discussed about surgical options.  If she were to undergo bilateral mastectomy and if there is no evidence of invasive cancer, there  is no role for adjuvant antiestrogen therapy.  She will return to clinic after surgery to review any additional recommendations.  All her questions were answered to the best my knowledge.  Genetic testing recommended and this was ordered today.   All questions were answered. The patient knows to call the clinic with any problems, questions or concerns.    Rachel Moulds, MD 01/29/23

## 2023-01-29 NOTE — Assessment & Plan Note (Signed)
This is a very pleasant 67 year old female patient with newly diagnosed bilateral DCIS ER/PR positive referred to medical oncology for additional recommendations.  Her husband and certified Spanish interpreter were present during the conversation.  Pathology review: I discussed with the patient the difference between DCIS and invasive breast cancer. It is considered a precancerous lesion. DCIS is classified as a Stage 0 breast cancer. It is generally detected through mammograms as calcifications. We discussed the significance of grades and its impact on prognosis. We also discussed the importance of ER and PR receptors and their implications to adjuvant treatment options. Prognosis of DCIS dependence on grade and degree of comedo necrosis. It is anticipated that if not treated, 20-30% of DCIS can develop into invasive breast cancer.  Recommendation: 1. Breast conserving surgery 2. Followed by adjuvant radiation therapy 3. Followed by antiestrogen therapy with tamoxifen/aromatase inhibitors based on menopausal status 5 years  Patient however was hoping for bilateral mastectomy since she has to go through bilateral surgery.  I have conveyed this request to Dr. Donell Beers.  She will get her MRI done, follow-up with Dr. Donell Beers and once again discussed about surgical options.  If she were to undergo bilateral mastectomy and if there is no evidence of invasive cancer, there is no role for adjuvant antiestrogen therapy.  She will return to clinic after surgery to review any additional recommendations.  All her questions were answered to the best my knowledge.  Genetic testing recommended and this was ordered today.

## 2023-01-30 ENCOUNTER — Encounter: Payer: Self-pay | Admitting: *Deleted

## 2023-02-01 ENCOUNTER — Ambulatory Visit
Admission: RE | Admit: 2023-02-01 | Discharge: 2023-02-01 | Disposition: A | Discharge status: Home / Self Care | Payer: Medicare HMO | Source: Ambulatory Visit | Attending: General Surgery | Admitting: General Surgery

## 2023-02-01 DIAGNOSIS — N6312 Unspecified lump in the right breast, upper inner quadrant: Secondary | ICD-10-CM | POA: Diagnosis not present

## 2023-02-01 DIAGNOSIS — R928 Other abnormal and inconclusive findings on diagnostic imaging of breast: Secondary | ICD-10-CM | POA: Diagnosis not present

## 2023-02-01 DIAGNOSIS — Z171 Estrogen receptor negative status [ER-]: Secondary | ICD-10-CM

## 2023-02-01 DIAGNOSIS — D0512 Intraductal carcinoma in situ of left breast: Secondary | ICD-10-CM

## 2023-02-01 DIAGNOSIS — N6325 Unspecified lump in the left breast, overlapping quadrants: Secondary | ICD-10-CM | POA: Diagnosis not present

## 2023-02-01 MED ORDER — GADOPICLENOL 0.5 MMOL/ML IV SOLN
8.0000 mL | Freq: Once | INTRAVENOUS | Status: AC | PRN
  Administered 2023-02-01: 8 mL via INTRAVENOUS

## 2023-02-03 ENCOUNTER — Other Ambulatory Visit: Payer: Self-pay | Admitting: General Surgery

## 2023-02-03 DIAGNOSIS — R9389 Abnormal findings on diagnostic imaging of other specified body structures: Secondary | ICD-10-CM

## 2023-02-04 ENCOUNTER — Encounter: Payer: Self-pay | Admitting: *Deleted

## 2023-02-07 ENCOUNTER — Ambulatory Visit
Admission: RE | Admit: 2023-02-07 | Discharge: 2023-02-07 | Disposition: A | Discharge status: Home / Self Care | Payer: Medicare HMO | Source: Ambulatory Visit | Attending: General Surgery | Admitting: General Surgery

## 2023-02-07 ENCOUNTER — Encounter: Payer: Self-pay | Admitting: Genetic Counselor

## 2023-02-07 DIAGNOSIS — N6315 Unspecified lump in the right breast, overlapping quadrants: Secondary | ICD-10-CM | POA: Diagnosis not present

## 2023-02-07 DIAGNOSIS — R9389 Abnormal findings on diagnostic imaging of other specified body structures: Secondary | ICD-10-CM

## 2023-02-07 DIAGNOSIS — N6012 Diffuse cystic mastopathy of left breast: Secondary | ICD-10-CM | POA: Diagnosis not present

## 2023-02-07 DIAGNOSIS — N6341 Unspecified lump in right breast, subareolar: Secondary | ICD-10-CM | POA: Diagnosis not present

## 2023-02-07 DIAGNOSIS — N6320 Unspecified lump in the left breast, unspecified quadrant: Secondary | ICD-10-CM | POA: Diagnosis not present

## 2023-02-07 DIAGNOSIS — N6011 Diffuse cystic mastopathy of right breast: Secondary | ICD-10-CM | POA: Diagnosis not present

## 2023-02-07 DIAGNOSIS — Z1379 Encounter for other screening for genetic and chromosomal anomalies: Secondary | ICD-10-CM | POA: Insufficient documentation

## 2023-02-07 DIAGNOSIS — N6021 Fibroadenosis of right breast: Secondary | ICD-10-CM | POA: Diagnosis not present

## 2023-02-07 MED ORDER — GADOPICLENOL 0.5 MMOL/ML IV SOLN
6.0000 mL | Freq: Once | INTRAVENOUS | Status: AC | PRN
  Administered 2023-02-07: 6 mL via INTRAVENOUS

## 2023-02-10 ENCOUNTER — Other Ambulatory Visit: Payer: Self-pay

## 2023-02-10 LAB — SURGICAL PATHOLOGY

## 2023-02-10 NOTE — Progress Notes (Unsigned)
Patient attempted to be outreached by Nelma Rothman, PharmD Candidate to discuss hypertension. Call back later in the week with the help of a Spanish interpreter.   Nelma Rothman, PharmD Candidate 02/10/2023 10:08 AM

## 2023-02-11 ENCOUNTER — Encounter: Payer: Self-pay | Admitting: *Deleted

## 2023-02-11 ENCOUNTER — Other Ambulatory Visit: Payer: Self-pay | Admitting: General Surgery

## 2023-02-11 DIAGNOSIS — Z170 Estrogen receptor positive status [ER+]: Secondary | ICD-10-CM | POA: Diagnosis not present

## 2023-02-11 DIAGNOSIS — C50112 Malignant neoplasm of central portion of left female breast: Secondary | ICD-10-CM | POA: Diagnosis not present

## 2023-02-11 DIAGNOSIS — C50111 Malignant neoplasm of central portion of right female breast: Secondary | ICD-10-CM | POA: Diagnosis not present

## 2023-02-19 ENCOUNTER — Other Ambulatory Visit: Payer: Self-pay | Admitting: Internal Medicine

## 2023-02-19 ENCOUNTER — Inpatient Hospital Stay: Payer: Medicare HMO | Admitting: Genetic Counselor

## 2023-02-19 ENCOUNTER — Other Ambulatory Visit (HOSPITAL_BASED_OUTPATIENT_CLINIC_OR_DEPARTMENT_OTHER): Payer: Self-pay

## 2023-02-19 ENCOUNTER — Encounter: Payer: Self-pay | Admitting: Genetic Counselor

## 2023-02-19 ENCOUNTER — Telehealth: Payer: Self-pay

## 2023-02-19 ENCOUNTER — Inpatient Hospital Stay: Payer: Medicare HMO

## 2023-02-19 ENCOUNTER — Other Ambulatory Visit: Payer: Medicare HMO

## 2023-02-19 DIAGNOSIS — Z1379 Encounter for other screening for genetic and chromosomal anomalies: Secondary | ICD-10-CM

## 2023-02-19 DIAGNOSIS — D0511 Intraductal carcinoma in situ of right breast: Secondary | ICD-10-CM

## 2023-02-19 DIAGNOSIS — Z8042 Family history of malignant neoplasm of prostate: Secondary | ICD-10-CM | POA: Diagnosis not present

## 2023-02-19 MED ORDER — METOPROLOL SUCCINATE ER 25 MG PO TB24
25.0000 mg | ORAL_TABLET | Freq: Every day | ORAL | 1 refills | Status: DC
  Filled 2023-02-19: qty 90, 90d supply, fill #0
  Filled 2023-06-04: qty 90, 90d supply, fill #1

## 2023-02-19 NOTE — Telephone Encounter (Signed)
Sophia can you please call and let the pt know that Dr Barron Alvine has asked to see the pt on 04/02/23 at 10 am to discuss any testing needed for pancreatic cancer screening?  Thank you

## 2023-02-19 NOTE — Progress Notes (Signed)
REFERRING PROVIDER: Rachel Moulds, MD [address] [place],  [place] [address]  PRIMARY PROVIDER:  Wanda Plump, MD  PRIMARY REASON FOR VISIT:  1. Family history of prostate cancer   2. Genetic testing   3. Ductal carcinoma in situ (DCIS) of right breast      HISTORY OF PRESENT ILLNESS:   [name], a 67 y.o. female, was seen for a [facility] cancer genetics consultation at the request of Dr. Al Pimple due to a personal history of bilateral DCIS.  Lauren Hodge presents to clinic today to discuss the possibility of a hereditary predisposition to cancer, genetic testing, and to further clarify her future cancer risks, as well as potential cancer risks for family members.   In September 2024, at the age of 59, Lauren Hodge was diagnosed with DCIS of the right and left breast. The treatment plan includes bilateral mastectomy.    CANCER HISTORY:  Oncology History  Ductal carcinoma in situ (DCIS) of right breast  12/31/2022 Mammogram   Patient had diagnostic mammogram for unilateral spontaneous bloody nipple discharge and this showed 3.7 cm linearly oriented group of round and punctate calcs in the retroareolar right breast there is also a 5 x 5 x 7 mm solid and cystic mass with associated calcs right breast 3:00 retroareolar location   01/10/2023 Pathology Results   Right breast needle core biopsy at 3:00 retroareolar showed DCIS intermediate to high-grade, right breast needle core biopsy retroareolar coil clip showed DCIS intermediate to high-grade, prognostic showed ER 100% positive strong staining, PR 85% positive strong staining   01/21/2023 Initial Diagnosis   Ductal carcinoma in situ (DCIS) of right breast   01/22/2023 Mammogram   Mammogram of the left breast showed 3.3 cm group of indeterminate calcs in the upper outer anterior left breast   01/22/2023 Pathology Results   Right breast needle core biopsy upper inner clip showed benign breast tissue with fibrocystic change and focal  columnar cell change involving mild adenosis and associated calcs.  Right breast upper outer Venus clip showed benign breast tissue with extensive sclerosing adenosis and prominent fibrocystic change with associated calcs and a focal incidental radial scar.  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  The report date is February 17, 2023.  The CancerNext-Expanded gene panel offered by [facility] and includes sequencing and rearrangement analysis for the following 77 genes: AIP, ALK, APC*, ATM*, AXIN2, BAP1, BARD1, BMPR1A, BRCA1*, BRCA2*, BRIP1*, CDC73, CDH1*, CDK4, CDKN1B, CDKN2A, CHEK2*, CTNNA1, DICER1, FH, FLCN, KIF1B, LZTR1, MAX, MEN1, MET, MLH1*, MSH2*, MSH3, MSH6*, MUTYH*, NF1*, NF2, NTHL1, PALB2*, PHOX2B, PMS2*, POT1, PRKAR1A, PTCH1, PTEN*, RAD51C*, RAD51D*, RB1, RET, SDHA, SDHAF2, SDHB, SDHC, SDHD, SMAD4, SMARCA4, SMARCB1, SMARCE1, STK11, SUFU, TMEM127, TP53*, TSC1, TSC2, and VHL (sequencing and deletion/duplication); EGFR, EGLN1, HOXB13, KIT, MITF, PDGFRA, POLD1, and POLE (sequencing only); EPCAM and GREM1 (deletion/duplication only). DNA and RNA analyses performed for * genes.       RISK FACTORS:  Menarche was at age 74.  First live birth at age 12.  OCP use for approximately 0 years.  Ovaries intact: yes.  Hysterectomy: no.  Menopausal status: postmenopausal.  HRT use: 0 years. Colonoscopy: yes; normal. Mammogram within the last year: yes. Number of breast  biopsies: 3. Up to date with pelvic exams: yes. Any excessive radiation exposure in the past: no  Past Medical History:  Diagnosis Date   Carpal tunnel syndrome    Family history of prostate cancer    Fracture of carpal  bone 2018   bilateral   GERD (gastroesophageal reflux disease)    Hemorrhoids    Hiatal hernia 02/2019   seen on EGD    History of left breast biopsy    Benign   HTN (hypertension)    Hyperlipidemia    Insomnia    Osteoporosis    Vitamin D deficiency     Past Surgical History:  Procedure Laterality Date   BREAST BIOPSY Left 2010   x2, 2010-2012 (no cancer)   BREAST BIOPSY Right 01/10/2023   [place] RT BREAST BX W LOC DEV 1ST LESION IMG BX SPEC US GUIDE 01/10/2023 [facility]   BREAST BIOPSY Right 01/10/2023   MM RT BREAST BX W LOC DEV 1ST LESION IMAGE BX SPEC STEREO GUIDE 01/10/2023 [facility]   BREAST BIOPSY Right 01/22/2023   MM RT BREAST BX W LOC DEV 1ST LESION IMAGE BX SPEC STEREO GUIDE 01/22/2023 [facility]   BREAST BIOPSY Right 01/22/2023   MM RT BREAST BX W LOC DEV EA AD LESION IMG BX SPEC STEREO GUIDE 01/22/2023 [facility]   BREAST BIOPSY Left 01/22/2023   MM LT BREAST BX W LOC DEV 1ST LESION IMAGE BX SPEC STEREO GUIDE 01/22/2023 [facility]   BREAST EXCISIONAL BIOPSY Left    CHOLECYSTECTOMY  2015   HEMORRHOIDECTOMY WITH HEMORRHOID BANDING  04/22/2017    Social History   Socioeconomic History   Marital status: Married    Spouse name: Not on file   Number of children: 2   Years of education: Not on file   Highest education level: Not on file  Occupational History   Occupation: retired   Tobacco Use   Smoking status: Former    Current packs/day: 0.00    Types: Cigarettes    Quit date: 08/25/2018    Years since quitting: 4.4   Smokeless tobacco: Never   Tobacco comments:    used to smoke rarely   Substance and Sexual Activity   Alcohol use: Not Currently    Comment: occasional   Drug use: Not Currently   Sexual activity: Not Currently  Other Topics Concern   Not on file  Social History Narrative   From [place] from [place] 2019   Social Determinants of Health   Financial Resource Strain: Not on file  Food Insecurity: No  Food Insecurity (07/27/2020)   Received from [facility], [facility]   Hunger Vital Sign    Worried About Running Out of Food in the Last Year: Never true    Ran Out of Food in the Last Year: Never true  Transportation Needs: No Transportation Needs (01/20/2023)   PRAPARE - Administrator, Civil Service (Medical): No    Lack of Transportation (Non-Medical): No  Physical Activity: Not on file  Stress: Not on file  Social Connections: Unknown (09/04/2021)   Received from [facility], [facility]   Social Network    Social Network: Not on file     FAMILY HISTORY:  We obtained a detailed, 4-generation family history.  Significant diagnoses are listed below: Family History  Problem Relation Age of Onset   Hypertension Mother    Prostate cancer Father    Tongue cancer Maternal Aunt    Cervical cancer  Maternal Grandmother    CAD Neg Hx    Diabetes Neg Hx    Colon cancer Neg Hx    Breast cancer Neg Hx      The patient has a son and daughter who are cancer free.  She has several paternal half siblings who she does not have information about.  Both parents are living.  The patient's father is 78.  He has been diagnosed with prostate cancer.  There is no additional information about his side of the family.  Th patient's mother is 37.  She has 5 sisters and four brothers.  One sister had tongue cancer.  The maternal grandmother had cervical cancer.  Lauren Hodge is unaware of previous family history of genetic testing for hereditary cancer risks. Patient's maternal ancestors are of [place] descent, and paternal ancestors are of [place] descent. There is no reported Ashkenazi Jewish ancestry. There is no known consanguinity.  GENETIC COUNSELING ASSESSMENT: Lauren Hodge is a 67 y.o. female with a personal history of breast cancer which is somewhat suggestive of a hereditary cancer syndrome and predisposition to cancer given her bilateral disease. We, therefore, discussed  and recommended the following at today's visit.   DISCUSSION: We discussed that, in general, most cancer is not inherited in families, but instead is sporadic or familial. Sporadic cancers occur by chance and typically happen at older ages (>50 years) as this type of cancer is caused by genetic changes acquired during an individual's lifetime. Some families have more cancers than would be expected by chance; however, the ages or types of cancer are not consistent with a known genetic mutation or known genetic mutations have been ruled out. This type of familial cancer is thought to be due to a combination of multiple genetic, environmental, hormonal, and lifestyle factors. While this combination of factors likely increases the risk of cancer, the exact source of this risk is not currently identifiable or testable.  We discussed that 5 - 10% of breast cancer is hereditary, with most cases associated with BRCA mutations.  There are other genes that can be associated with hereditary breast cancer syndromes.  These include ATM, CHEK2 and PALB2.  We discussed that testing is beneficial for several reasons including knowing how to follow individuals after completing their treatment, identifying whether potential treatment options such as PARP inhibitors would be beneficial, and understand if other family members could be at risk for cancer and allow them to undergo genetic testing.   We reviewed the characteristics, features and inheritance patterns of hereditary cancer syndromes. We also discussed genetic testing, including the appropriate family members to test, the process of testing, insurance coverage and turn-around-time for results. We discussed the implications of a negative, positive, carrier and/or variant of uncertain significant result. Lauren Hodge decided to pursue genetic testing for the CancerNext-Expanded+RNAinsight gene panel.   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 There were no deleterious mutations in AIP, ALK, APC*, ATM*, AXIN2, BAP1, BARD1, BMPR1A, BRCA1*, BRCA2*, BRIP1*, CDC73, CDH1*, CDK4, CDKN1B, CDKN2A, CHEK2*, CTNNA1, DICER1, FH, FLCN, KIF1B, LZTR1, MAX, MEN1, MET, MLH1*, MSH2*, MSH3, MSH6*, MUTYH*, NF1*, NF2, NTHL1, PALB2*, PHOX2B, PMS2*, POT1, PRKAR1A, PTCH1, PTEN*, RAD51C*, RAD51D*, RB1, RET, SDHA, SDHAF2, SDHB, SDHC, SDHD, SMAD4, SMARCA4, SMARCB1, SMARCE1, STK11, SUFU, TMEM127, TP53*, TSC1, TSC2, and VHL (sequencing and deletion/duplication); EGFR, EGLN1, HOXB13, KIT, MITF, PDGFRA, POLD1, and POLE (sequencing only); EPCAM and  GREM1 (deletion/duplication only). DNA and RNA analyses performed for * genes. .    Genetic testing did identify a variant of uncertain significance (VUS) was identified in the TSC1 gene called c.1852C>A.  At this time, it is unknown if this variant is associated with increased cancer risk or if this is a normal finding, but most variants such as this get reclassified to being inconsequential. It should not be used to make medical management decisions. With time, we suspect the lab will determine the significance of this variant, if any. If we do learn more about it, we will try to contact Lauren Hodge to discuss it further. However, it is important to stay in touch with [place] periodically and keep the address and phone number up to date.  Clinical Information: Hereditary breast due to pathogenic variants in ATM is characterized by an increased lifetime risk for, generally, adult-onset cancers including, breast, contralateral breast, ovarian, prostate, pancreatic and possibly colon cancer.  The cancers associated with ATM are:  Female breast cancer, up to an 40% risk In women with a history of breast cancer, the cumulative risk for contralateral breast cancer 10 years after breast  cancer diagnosis is 4%.  Ovarian cancer, up to a 3% risk Pancreatic cancer, 5-10% risk Prostate cancer, elevated risk Limited data suggest there may be a slightly increased risk of colon cancer    Management Recommendations:  Management for individuals with ATM mutations can be found in the NCCN guidelines (v1.2025).  These guidelines recommend the following:  Breast Cancer    Absolute risk: 20-40% Screening: Annual mammogram with consideration of tomosynthesis at age 1 and consider breast MRI with contrast starting at age 69 - 43 years. Risk Reducing Mastectomy: Evidence insufficient, consider based on family history Contralateral 10 year breast cancer risk: 4%  Ovarian Cancer  Absolute risk: 2-3% Evidence is insufficient for risk-reducing salpingo-oophorectomy (RRSO).  This risk should be managed by family history.  Pancreatic Cancer   Absolute risk: ~5-10% Screen starting at age 82 years (or 10 years younger than the earliest exocrine pancreatic cancer diagnosis in the family, whichever is earlier) for individuals with exocrine pancreatic cancer in >=1 first- or second-degree relatives from the same side of (or presumed to be from the same side of) the family as the identified P/LP germline variant  Prostate Cancer  Emerging evidence for association with increased risk. Consider prostate cancer screening starting at age 76.   FAMILY MEMBERS: Hereditary predisposition to cancer due to pathogenic variants in the ATM gene has autosomal dominant inheritance. This means that an individual with a pathogenic variant has a 50% chance of passing the condition on to their offspring. Once a pathogenic mutation is detected in an individual, it is possible to identify at-risk relatives who can pursue testing for this specific familial variant. Many cases are inherited from a parent, but some cases may occur spontaneously (i.e., an individual with a pathogenic variant who has parents who do  not have it).  Individuals with a single pathogenic ATM variant are also carriers of autosomal recessive Ataxia Telangiectasia. Ataxia Telangiectasia is characterized by childhood onset of progressive neurologic manifestations, immunodeficiency, pulmonary disease,  and increased risk for cancer. For there to be a risk of Ataxia Telangiectasia in offspring, both the patient and their partner would each have to carry a pathogenic variant in ATM; in this case, the risk to have an affected child is 25%.It is important that all of Lauren Hodge's relatives (both men and women) know of the  presence of this gene mutation. Site-specific genetic testing can sort out who in the family is at risk and who is not. It is important that all of Lauren Hodge's relatives (both men and women) know of the presence of this gene mutation. Site-specific genetic testing can sort out who in the family is at risk and who is not.   Lauren Hodge children and, most likely, paternal half-siblings have a 50% chance to have inherited this mutation. We recommend they have genetic testing for this same mutation, as identifying the presence of this mutation would allow them to also take advantage of risk-reducing measures. The testing laboratory will offer complementary genetic testing for this single ATM pathogenic variant if testing is performed within 90 days of Lauren Hodge's report date.  We discussed that her children may need additional testing if their father has cancer in his family.  This prompted a discussion of Lauren Hodge's husband's family history.  This discussion can be found in his chart.  We encouraged Lauren Hodge to remain in contact with [place] on an annual basis so we can update her personal and family histories, and let her know of advances in cancer genetics that may benefit the family. Our contact number was provided. Lauren Hodge questions were answered to her satisfaction today, and she knows she is welcome to call  anytime with additional questions.   Lauren Hodge P. Lowell Guitar, MS, [facility] Licensed, Patent attorney [email or web address] phone: [phone]  The patient was seen for a total of 40 minutes in face-to-face genetic counseling.

## 2023-02-19 NOTE — Telephone Encounter (Signed)
-----   Message from [facility] sent at 02/19/2023 11:58 AM EDT ----- KLP, She is currently scheduled in clinic with one of our APPs. She was a DJ patient previously. Why don't we have our RNs reach out and then she can be placed on VC or my schedule and we can discuss with her the role of Pancreatic Screening if she agrees to this and get her UTD on Colon Polyp surveillance which she is due in 2025.  Eben Choinski, Can you reach out to patient and let her know that Dr. Salena Saner or I would like to see her in clinic.  We will discuss with her High Risk Pancreatic Screening and if she is interested in this.  We will get her on our list for Colonoscopy as she is due in 2025. Either of [place] is fine whichever has earlier appointment, but can use a double book or held slot for me as well. Thanks. GM ----- Message ----- From: Daisy Lazar, Counselor Sent: 02/19/2023  11:56 AM EDT To: Lemar Lofty., MD; #  Good morning, this patient has an ATM pathogenic variant and is in need of high risk pancreatic cancer screening.  She is scheduled for bilateral mast on 11/13, so probably in January.

## 2023-02-19 NOTE — Telephone Encounter (Signed)
Spoke with patient, confirmed appointment.

## 2023-02-20 ENCOUNTER — Encounter: Payer: Self-pay | Admitting: *Deleted

## 2023-02-22 ENCOUNTER — Telehealth: Payer: Self-pay | Admitting: Hematology and Oncology

## 2023-02-22 NOTE — Telephone Encounter (Signed)
Spoke with patient confirming upcoming appointment  

## 2023-02-24 ENCOUNTER — Other Ambulatory Visit: Payer: Self-pay

## 2023-02-24 ENCOUNTER — Encounter (HOSPITAL_BASED_OUTPATIENT_CLINIC_OR_DEPARTMENT_OTHER): Payer: Self-pay | Admitting: General Surgery

## 2023-02-26 ENCOUNTER — Encounter (HOSPITAL_BASED_OUTPATIENT_CLINIC_OR_DEPARTMENT_OTHER)
Admission: RE | Admit: 2023-02-26 | Discharge: 2023-02-26 | Disposition: A | Discharge status: Home / Self Care | Payer: Medicare HMO | Source: Ambulatory Visit | Attending: General Surgery | Admitting: General Surgery

## 2023-02-26 MED ORDER — CHLORHEXIDINE GLUCONATE CLOTH 2 % EX PADS: 6.0000 | MEDICATED_PAD | Freq: Once | CUTANEOUS | Status: DC

## 2023-02-26 NOTE — Progress Notes (Signed)
Patient came for drink/chg pickup and EKG but has to leave urgently and states she will be back tomorrow for EKG,

## 2023-02-27 ENCOUNTER — Encounter (HOSPITAL_BASED_OUTPATIENT_CLINIC_OR_DEPARTMENT_OTHER)
Admission: RE | Admit: 2023-02-27 | Discharge: 2023-02-27 | Disposition: A | Discharge status: Home / Self Care | Payer: Medicare HMO | Source: Ambulatory Visit | Attending: General Surgery | Admitting: General Surgery

## 2023-02-27 DIAGNOSIS — Z01810 Encounter for preprocedural cardiovascular examination: Secondary | ICD-10-CM | POA: Insufficient documentation

## 2023-02-27 DIAGNOSIS — I10 Essential (primary) hypertension: Secondary | ICD-10-CM | POA: Diagnosis not present

## 2023-03-04 ENCOUNTER — Ambulatory Visit (HOSPITAL_COMMUNITY): Admission: RE | Admit: 2023-03-04 | Payer: Medicare HMO | Source: Ambulatory Visit

## 2023-03-04 ENCOUNTER — Other Ambulatory Visit: Payer: Self-pay

## 2023-03-04 ENCOUNTER — Ambulatory Visit
Admission: RE | Admit: 2023-03-04 | Discharge: 2023-03-04 | Disposition: A | Discharge status: Home / Self Care | Payer: Medicare HMO | Source: Ambulatory Visit | Attending: General Surgery | Admitting: General Surgery

## 2023-03-04 DIAGNOSIS — C50911 Malignant neoplasm of unspecified site of right female breast: Secondary | ICD-10-CM | POA: Diagnosis not present

## 2023-03-04 NOTE — Anesthesia Preprocedure Evaluation (Signed)
Anesthesia Evaluation  Patient identified by MRN, date of birth, ID band Patient awake    Reviewed: Allergy & Precautions, NPO status , Patient's Chart, lab work & pertinent test results  History of Anesthesia Complications Negative for: history of anesthetic complications  Airway Mallampati: III  TM Distance: >3 FB Neck ROM: Full    Dental  (+) Dental Advisory Given   Pulmonary neg shortness of breath, neg sleep apnea, neg COPD, neg recent URI, former smoker   Pulmonary exam normal breath sounds clear to auscultation       Cardiovascular hypertension (losartan, metoprolol), Pt. on medications and Pt. on home beta blockers (-) angina (-) Past MI, (-) Cardiac Stents and (-) CABG (-) dysrhythmias  Rhythm:Regular Rate:Normal  HLD   Neuro/Psych negative neurological ROS     GI/Hepatic Neg liver ROS, hiatal hernia,GERD  Medicated,,  Endo/Other  negative endocrine ROS    Renal/GU negative Renal ROS     Musculoskeletal Osteoporosis    Abdominal   Peds  Hematology negative hematology ROS (+)   Anesthesia Other Findings   Reproductive/Obstetrics Bilateral breast cancer                             Anesthesia Physical Anesthesia Plan  ASA: 2  Anesthesia Plan: General and Regional   Post-op Pain Management: Regional block* and Tylenol PO (pre-op)*   Induction: Intravenous  PONV Risk Score and Plan: 3 and Ondansetron, Dexamethasone, Treatment may vary due to age or medical condition, Midazolam and Scopolamine patch - Pre-op  Airway Management Planned: Oral ETT  Additional Equipment:   Intra-op Plan:   Post-operative Plan: Extubation in OR  Informed Consent: I have reviewed the patients History and Physical, chart, labs and discussed the procedure including the risks, benefits and alternatives for the proposed anesthesia with the patient or authorized representative who has indicated  his/her understanding and acceptance.     Dental advisory given and Interpreter used for interview  Plan Discussed with: CRNA and Anesthesiologist  Anesthesia Plan Comments: (Discussed potential risks of nerve blocks including, but not limited to, infection, bleeding, nerve damage, seizures, pneumothorax, respiratory depression, and potential failure of the block. Alternatives to nerve blocks discussed. All questions answered.  Risks of general anesthesia discussed including, but not limited to, sore throat, hoarse voice, chipped/damaged teeth, injury to vocal cords, nausea and vomiting, allergic reactions, lung infection, heart attack, stroke, and death. All questions answered. )        Anesthesia Quick Evaluation

## 2023-03-05 ENCOUNTER — Other Ambulatory Visit: Payer: Self-pay

## 2023-03-05 ENCOUNTER — Ambulatory Visit (HOSPITAL_BASED_OUTPATIENT_CLINIC_OR_DEPARTMENT_OTHER): Payer: Medicare HMO | Admitting: Anesthesiology

## 2023-03-05 ENCOUNTER — Other Ambulatory Visit (HOSPITAL_BASED_OUTPATIENT_CLINIC_OR_DEPARTMENT_OTHER): Payer: Self-pay

## 2023-03-05 ENCOUNTER — Ambulatory Visit (HOSPITAL_BASED_OUTPATIENT_CLINIC_OR_DEPARTMENT_OTHER)
Admission: RE | Admit: 2023-03-05 | Discharge: 2023-03-06 | Disposition: A | Discharge status: Home / Self Care | Payer: Medicare HMO | Attending: General Surgery | Admitting: General Surgery

## 2023-03-05 ENCOUNTER — Encounter (HOSPITAL_BASED_OUTPATIENT_CLINIC_OR_DEPARTMENT_OTHER): Payer: Self-pay | Admitting: General Surgery

## 2023-03-05 ENCOUNTER — Encounter (HOSPITAL_BASED_OUTPATIENT_CLINIC_OR_DEPARTMENT_OTHER)
Admission: RE | Disposition: A | Discharge status: Home / Self Care | Payer: Self-pay | Source: Home / Self Care | Attending: General Surgery

## 2023-03-05 ENCOUNTER — Ambulatory Visit (HOSPITAL_BASED_OUTPATIENT_CLINIC_OR_DEPARTMENT_OTHER): Payer: Self-pay | Admitting: Anesthesiology

## 2023-03-05 DIAGNOSIS — Z79899 Other long term (current) drug therapy: Secondary | ICD-10-CM | POA: Insufficient documentation

## 2023-03-05 DIAGNOSIS — C50112 Malignant neoplasm of central portion of left female breast: Secondary | ICD-10-CM | POA: Diagnosis not present

## 2023-03-05 DIAGNOSIS — D241 Benign neoplasm of right breast: Secondary | ICD-10-CM | POA: Diagnosis not present

## 2023-03-05 DIAGNOSIS — K449 Diaphragmatic hernia without obstruction or gangrene: Secondary | ICD-10-CM | POA: Diagnosis not present

## 2023-03-05 DIAGNOSIS — K219 Gastro-esophageal reflux disease without esophagitis: Secondary | ICD-10-CM | POA: Diagnosis not present

## 2023-03-05 DIAGNOSIS — D0512 Intraductal carcinoma in situ of left breast: Secondary | ICD-10-CM | POA: Insufficient documentation

## 2023-03-05 DIAGNOSIS — I10 Essential (primary) hypertension: Secondary | ICD-10-CM | POA: Insufficient documentation

## 2023-03-05 DIAGNOSIS — C50912 Malignant neoplasm of unspecified site of left female breast: Secondary | ICD-10-CM

## 2023-03-05 DIAGNOSIS — N6031 Fibrosclerosis of right breast: Secondary | ICD-10-CM | POA: Diagnosis not present

## 2023-03-05 DIAGNOSIS — C50911 Malignant neoplasm of unspecified site of right female breast: Secondary | ICD-10-CM | POA: Diagnosis present

## 2023-03-05 DIAGNOSIS — Z170 Estrogen receptor positive status [ER+]: Secondary | ICD-10-CM | POA: Diagnosis not present

## 2023-03-05 DIAGNOSIS — C50111 Malignant neoplasm of central portion of right female breast: Secondary | ICD-10-CM | POA: Diagnosis not present

## 2023-03-05 DIAGNOSIS — G8918 Other acute postprocedural pain: Secondary | ICD-10-CM | POA: Diagnosis not present

## 2023-03-05 DIAGNOSIS — N6032 Fibrosclerosis of left breast: Secondary | ICD-10-CM | POA: Diagnosis not present

## 2023-03-05 DIAGNOSIS — N6002 Solitary cyst of left breast: Secondary | ICD-10-CM | POA: Diagnosis not present

## 2023-03-05 DIAGNOSIS — D0511 Intraductal carcinoma in situ of right breast: Secondary | ICD-10-CM | POA: Diagnosis not present

## 2023-03-05 DIAGNOSIS — M810 Age-related osteoporosis without current pathological fracture: Secondary | ICD-10-CM | POA: Diagnosis not present

## 2023-03-05 DIAGNOSIS — Z8249 Family history of ischemic heart disease and other diseases of the circulatory system: Secondary | ICD-10-CM | POA: Insufficient documentation

## 2023-03-05 DIAGNOSIS — N6021 Fibroadenosis of right breast: Secondary | ICD-10-CM | POA: Diagnosis not present

## 2023-03-05 DIAGNOSIS — N6022 Fibroadenosis of left breast: Secondary | ICD-10-CM | POA: Diagnosis not present

## 2023-03-05 DIAGNOSIS — N6001 Solitary cyst of right breast: Secondary | ICD-10-CM | POA: Diagnosis not present

## 2023-03-05 HISTORY — PX: SIMPLE MASTECTOMY WITH AXILLARY SENTINEL NODE BIOPSY: SHX6098

## 2023-03-05 SURGERY — SIMPLE MASTECTOMY
Anesthesia: Regional | Site: Breast | Laterality: Bilateral

## 2023-03-05 MED ORDER — TRAMADOL HCL 50 MG PO TABS
50.0000 mg | ORAL_TABLET | Freq: Four times a day (QID) | ORAL | Status: DC | PRN
  Administered 2023-03-05: 50 mg via ORAL
  Filled 2023-03-05: qty 1

## 2023-03-05 MED ORDER — MAGTRACE LYMPHATIC TRACER
INTRAMUSCULAR | Status: DC | PRN
  Administered 2023-03-05: 4 mL via INTRAMUSCULAR

## 2023-03-05 MED ORDER — FENTANYL CITRATE (PF) 100 MCG/2ML IJ SOLN
100.0000 ug | Freq: Once | INTRAMUSCULAR | Status: AC
  Administered 2023-03-05: 100 ug via INTRAVENOUS

## 2023-03-05 MED ORDER — GABAPENTIN 100 MG PO CAPS
100.0000 mg | ORAL_CAPSULE | Freq: Two times a day (BID) | ORAL | Status: DC
  Administered 2023-03-05 (×2): 100 mg via ORAL

## 2023-03-05 MED ORDER — METHOCARBAMOL 500 MG PO TABS
500.0000 mg | ORAL_TABLET | Freq: Four times a day (QID) | ORAL | 1 refills | Status: DC | PRN
  Filled 2023-03-05: qty 20, 5d supply, fill #0
  Filled 2023-03-17: qty 20, 5d supply, fill #1

## 2023-03-05 MED ORDER — SCOPOLAMINE 1 MG/3DAYS TD PT72
MEDICATED_PATCH | TRANSDERMAL | Status: AC
  Filled 2023-03-05: qty 1

## 2023-03-05 MED ORDER — CEFAZOLIN SODIUM-DEXTROSE 2-4 GM/100ML-% IV SOLN
2.0000 g | Freq: Three times a day (TID) | INTRAVENOUS | Status: AC
  Administered 2023-03-05: 2 g via INTRAVENOUS
  Filled 2023-03-05: qty 100

## 2023-03-05 MED ORDER — CEFAZOLIN SODIUM-DEXTROSE 2-4 GM/100ML-% IV SOLN
INTRAVENOUS | Status: AC
  Filled 2023-03-05: qty 100

## 2023-03-05 MED ORDER — EPHEDRINE 5 MG/ML INJ
INTRAVENOUS | Status: AC
  Filled 2023-03-05: qty 5

## 2023-03-05 MED ORDER — FENTANYL CITRATE (PF) 100 MCG/2ML IJ SOLN
INTRAMUSCULAR | Status: AC
  Filled 2023-03-05: qty 2

## 2023-03-05 MED ORDER — PROCHLORPERAZINE EDISYLATE 10 MG/2ML IJ SOLN: 5.0000 mg | Freq: Four times a day (QID) | INTRAMUSCULAR | Status: DC | PRN

## 2023-03-05 MED ORDER — ACETAMINOPHEN 500 MG PO TABS
1000.0000 mg | ORAL_TABLET | ORAL | Status: AC
  Administered 2023-03-05: 1000 mg via ORAL

## 2023-03-05 MED ORDER — GABAPENTIN 100 MG PO CAPS
100.0000 mg | ORAL_CAPSULE | Freq: Two times a day (BID) | ORAL | 1 refills | Status: DC
  Filled 2023-03-05: qty 30, 15d supply, fill #0
  Filled 2023-03-24: qty 30, 15d supply, fill #1

## 2023-03-05 MED ORDER — GABAPENTIN 100 MG PO CAPS
ORAL_CAPSULE | ORAL | Status: AC
  Filled 2023-03-05: qty 1

## 2023-03-05 MED ORDER — ONDANSETRON HCL 4 MG/2ML IJ SOLN
INTRAMUSCULAR | Status: DC | PRN
  Administered 2023-03-05: 4 mg via INTRAVENOUS

## 2023-03-05 MED ORDER — EPHEDRINE SULFATE (PRESSORS) 50 MG/ML IJ SOLN
INTRAMUSCULAR | Status: DC | PRN
  Administered 2023-03-05: 5 mg via INTRAVENOUS
  Administered 2023-03-05: 10 mg via INTRAVENOUS
  Administered 2023-03-05: 5 mg via INTRAVENOUS

## 2023-03-05 MED ORDER — POLYETHYLENE GLYCOL 3350 17 G PO PACK: 17.0000 g | PACK | Freq: Every day | ORAL | Status: DC | PRN

## 2023-03-05 MED ORDER — TRANEXAMIC ACID 1000 MG/10ML IV SOLN
INTRAVENOUS | Status: AC
  Filled 2023-03-05: qty 30

## 2023-03-05 MED ORDER — MIDAZOLAM HCL 2 MG/2ML IJ SOLN
2.0000 mg | Freq: Once | INTRAMUSCULAR | Status: AC
  Administered 2023-03-05: 2 mg via INTRAVENOUS

## 2023-03-05 MED ORDER — DIPHENHYDRAMINE HCL 50 MG/ML IJ SOLN
12.5000 mg | Freq: Four times a day (QID) | INTRAMUSCULAR | Status: DC | PRN
Start: 2023-03-05 — End: 2023-03-06

## 2023-03-05 MED ORDER — ROCURONIUM BROMIDE 10 MG/ML (PF) SYRINGE
PREFILLED_SYRINGE | INTRAVENOUS | Status: AC
  Filled 2023-03-05: qty 10

## 2023-03-05 MED ORDER — IBUPROFEN 600 MG PO TABS
600.0000 mg | ORAL_TABLET | Freq: Four times a day (QID) | ORAL | Status: DC
  Administered 2023-03-05 – 2023-03-06 (×3): 600 mg via ORAL
  Filled 2023-03-05 (×2): qty 1

## 2023-03-05 MED ORDER — BUPIVACAINE HCL (PF) 0.25 % IJ SOLN
INTRAMUSCULAR | Status: DC | PRN
  Administered 2023-03-05 (×2): 30 mL via PERINEURAL

## 2023-03-05 MED ORDER — ACETAMINOPHEN 500 MG PO TABS
ORAL_TABLET | ORAL | Status: AC
  Filled 2023-03-05: qty 2

## 2023-03-05 MED ORDER — TRANEXAMIC ACID 1000 MG/10ML IV SOLN
Status: DC | PRN
  Administered 2023-03-05: 3000 mg via TOPICAL

## 2023-03-05 MED ORDER — OXYCODONE HCL 5 MG PO TABS: 5.0000 mg | ORAL_TABLET | Freq: Once | ORAL | Status: DC | PRN

## 2023-03-05 MED ORDER — SUCCINYLCHOLINE CHLORIDE 200 MG/10ML IV SOSY
PREFILLED_SYRINGE | INTRAVENOUS | Status: AC
  Filled 2023-03-05: qty 10

## 2023-03-05 MED ORDER — AMISULPRIDE (ANTIEMETIC) 5 MG/2ML IV SOLN: 10.0000 mg | Freq: Once | INTRAVENOUS | Status: DC | PRN

## 2023-03-05 MED ORDER — ONDANSETRON 4 MG PO TBDP
4.0000 mg | ORAL_TABLET | Freq: Four times a day (QID) | ORAL | 0 refills | Status: DC | PRN
  Filled 2023-03-05: qty 20, 5d supply, fill #0

## 2023-03-05 MED ORDER — METHOCARBAMOL 500 MG PO TABS
500.0000 mg | ORAL_TABLET | Freq: Four times a day (QID) | ORAL | Status: DC | PRN
  Administered 2023-03-05 – 2023-03-06 (×2): 500 mg via ORAL
  Filled 2023-03-05 (×2): qty 1

## 2023-03-05 MED ORDER — METOPROLOL SUCCINATE ER 25 MG PO TB24
25.0000 mg | ORAL_TABLET | Freq: Every day | ORAL | Status: DC
  Filled 2023-03-05: qty 1

## 2023-03-05 MED ORDER — IBUPROFEN 600 MG PO TABS
600.0000 mg | ORAL_TABLET | Freq: Four times a day (QID) | ORAL | Status: DC | PRN
  Filled 2023-03-05: qty 1

## 2023-03-05 MED ORDER — ONDANSETRON HCL 4 MG/2ML IJ SOLN: 4.0000 mg | Freq: Four times a day (QID) | INTRAMUSCULAR | Status: DC | PRN

## 2023-03-05 MED ORDER — SUGAMMADEX SODIUM 200 MG/2ML IV SOLN
INTRAVENOUS | Status: DC | PRN
  Administered 2023-03-05: 200 mg via INTRAVENOUS

## 2023-03-05 MED ORDER — ONDANSETRON 4 MG PO TBDP: 4.0000 mg | ORAL_TABLET | Freq: Four times a day (QID) | ORAL | Status: DC | PRN

## 2023-03-05 MED ORDER — MIDAZOLAM HCL 2 MG/2ML IJ SOLN
INTRAMUSCULAR | Status: AC
  Filled 2023-03-05: qty 2

## 2023-03-05 MED ORDER — ONDANSETRON HCL 4 MG/2ML IJ SOLN
INTRAMUSCULAR | Status: AC
  Filled 2023-03-05: qty 2

## 2023-03-05 MED ORDER — LACTATED RINGERS IV SOLN: INTRAVENOUS | Status: DC

## 2023-03-05 MED ORDER — DIPHENHYDRAMINE HCL 12.5 MG/5ML PO ELIX: 12.5000 mg | ORAL_SOLUTION | Freq: Four times a day (QID) | ORAL | Status: DC | PRN

## 2023-03-05 MED ORDER — OXYCODONE HCL 5 MG PO TABS
5.0000 mg | ORAL_TABLET | ORAL | Status: DC | PRN
  Administered 2023-03-05 (×2): 5 mg via ORAL
  Administered 2023-03-06: 10 mg via ORAL
  Filled 2023-03-05 (×2): qty 1
  Filled 2023-03-05: qty 2

## 2023-03-05 MED ORDER — DEXAMETHASONE SODIUM PHOSPHATE 10 MG/ML IJ SOLN
INTRAMUSCULAR | Status: AC
  Filled 2023-03-05: qty 1

## 2023-03-05 MED ORDER — FENTANYL CITRATE (PF) 100 MCG/2ML IJ SOLN
25.0000 ug | INTRAMUSCULAR | Status: DC | PRN
  Administered 2023-03-05 (×3): 50 ug via INTRAVENOUS

## 2023-03-05 MED ORDER — ACETAMINOPHEN 500 MG PO TABS
1000.0000 mg | ORAL_TABLET | Freq: Four times a day (QID) | ORAL | Status: DC
  Administered 2023-03-05 – 2023-03-06 (×4): 1000 mg via ORAL
  Filled 2023-03-05 (×5): qty 2

## 2023-03-05 MED ORDER — DEXAMETHASONE SODIUM PHOSPHATE 4 MG/ML IJ SOLN
INTRAMUSCULAR | Status: DC | PRN
  Administered 2023-03-05: 5 mg via INTRAVENOUS

## 2023-03-05 MED ORDER — VITAMIN D 25 MCG (1000 UNIT) PO TABS
1000.0000 [IU] | ORAL_TABLET | Freq: Every day | ORAL | Status: DC
  Administered 2023-03-05: 1000 [IU] via ORAL
  Filled 2023-03-05: qty 1

## 2023-03-05 MED ORDER — LIDOCAINE HCL (CARDIAC) PF 100 MG/5ML IV SOSY
PREFILLED_SYRINGE | INTRAVENOUS | Status: DC | PRN
  Administered 2023-03-05: 60 mg via INTRAVENOUS

## 2023-03-05 MED ORDER — PREDNISONE 10 MG PO TABS: 20.0000 mg | ORAL_TABLET | Freq: Every day | ORAL | Status: DC

## 2023-03-05 MED ORDER — PROPOFOL 10 MG/ML IV BOLUS
INTRAVENOUS | Status: DC | PRN
  Administered 2023-03-05: 150 mg via INTRAVENOUS

## 2023-03-05 MED ORDER — MELATONIN 3 MG PO TABS: 3.0000 mg | ORAL_TABLET | Freq: Every evening | ORAL | Status: DC | PRN

## 2023-03-05 MED ORDER — FENTANYL CITRATE (PF) 100 MCG/2ML IJ SOLN
INTRAMUSCULAR | Status: DC | PRN
  Administered 2023-03-05: 50 ug via INTRAVENOUS
  Administered 2023-03-05: 25 ug via INTRAVENOUS

## 2023-03-05 MED ORDER — PROPOFOL 500 MG/50ML IV EMUL
INTRAVENOUS | Status: DC | PRN
  Administered 2023-03-05: 25 ug/kg/min via INTRAVENOUS

## 2023-03-05 MED ORDER — ALBUMIN HUMAN 5 % IV SOLN
INTRAVENOUS | Status: AC
  Filled 2023-03-05: qty 250

## 2023-03-05 MED ORDER — OXYCODONE HCL 5 MG/5ML PO SOLN: 5.0000 mg | Freq: Once | ORAL | Status: DC | PRN

## 2023-03-05 MED ORDER — ALBUMIN HUMAN 5 % IV SOLN: INTRAVENOUS | Status: DC | PRN

## 2023-03-05 MED ORDER — LIDOCAINE 2% (20 MG/ML) 5 ML SYRINGE
INTRAMUSCULAR | Status: AC
  Filled 2023-03-05: qty 5

## 2023-03-05 MED ORDER — MIDAZOLAM HCL 2 MG/2ML IJ SOLN
INTRAMUSCULAR | Status: AC
Start: 2023-03-05 — End: ?
  Filled 2023-03-05: qty 2

## 2023-03-05 MED ORDER — LOSARTAN POTASSIUM 50 MG PO TABS
50.0000 mg | ORAL_TABLET | Freq: Every day | ORAL | Status: DC
  Administered 2023-03-05: 50 mg via ORAL
  Filled 2023-03-05: qty 1

## 2023-03-05 MED ORDER — PROCHLORPERAZINE MALEATE 10 MG PO TABS
10.0000 mg | ORAL_TABLET | Freq: Four times a day (QID) | ORAL | Status: DC | PRN
Start: 2023-03-05 — End: 2023-03-06

## 2023-03-05 MED ORDER — MORPHINE SULFATE (PF) 4 MG/ML IV SOLN: 1.0000 mg | INTRAVENOUS | Status: DC | PRN

## 2023-03-05 MED ORDER — 0.9 % SODIUM CHLORIDE (POUR BTL) OPTIME
TOPICAL | Status: DC | PRN
  Administered 2023-03-05: 1000 mL

## 2023-03-05 MED ORDER — ROCURONIUM BROMIDE 100 MG/10ML IV SOLN
INTRAVENOUS | Status: DC | PRN
  Administered 2023-03-05: 60 mg via INTRAVENOUS

## 2023-03-05 MED ORDER — SCOPOLAMINE 1 MG/3DAYS TD PT72SCOPOLAMINE 1 MG/3DAYS
1.0000 | MEDICATED_PATCH | TRANSDERMAL | Status: DC
Start: 2023-03-05 — End: 2023-03-05
  Administered 2023-03-05: 1.5 mg via TRANSDERMAL

## 2023-03-05 MED ORDER — KCL IN DEXTROSE-NACL 20-5-0.45 MEQ/L-%-% IV SOLN
INTRAVENOUS | Status: AC
Start: 2023-03-05 — End: 2023-03-05
  Filled 2023-03-05: qty 1000

## 2023-03-05 MED ORDER — OXYCODONE HCL 5 MG PO TABS
5.0000 mg | ORAL_TABLET | ORAL | 0 refills | Status: DC | PRN
  Filled 2023-03-05: qty 20, 2d supply, fill #0

## 2023-03-05 MED ORDER — SENNA 8.6 MG PO TABS
1.0000 | ORAL_TABLET | Freq: Two times a day (BID) | ORAL | Status: DC
  Administered 2023-03-05: 8.6 mg via ORAL
  Filled 2023-03-05: qty 1

## 2023-03-05 MED ORDER — ATROPINE SULFATE 0.4 MG/ML IV SOLN
INTRAVENOUS | Status: AC
  Filled 2023-03-05: qty 1

## 2023-03-05 MED ORDER — PHENYLEPHRINE 80 MCG/ML (10ML) SYRINGE FOR IV PUSH (FOR BLOOD PRESSURE SUPPORT)
PREFILLED_SYRINGE | INTRAVENOUS | Status: AC
  Filled 2023-03-05: qty 10

## 2023-03-05 MED ORDER — CEFAZOLIN SODIUM-DEXTROSE 2-4 GM/100ML-% IV SOLN
2.0000 g | INTRAVENOUS | Status: AC
Start: 2023-03-05 — End: 2023-03-05
  Administered 2023-03-05: 2 g via INTRAVENOUS

## 2023-03-05 MED ORDER — PANTOPRAZOLE SODIUM 40 MG PO TBEC: 40.0000 mg | DELAYED_RELEASE_TABLET | Freq: Every day | ORAL | Status: DC

## 2023-03-05 SURGICAL SUPPLY — 71 items
BAG DECANTER FOR FLEXI CONT (MISCELLANEOUS) IMPLANT
BINDER BREAST LRG (GAUZE/BANDAGES/DRESSINGS) IMPLANT
BINDER BREAST MEDIUM (GAUZE/BANDAGES/DRESSINGS) IMPLANT
BINDER BREAST XLRG (GAUZE/BANDAGES/DRESSINGS) IMPLANT
BINDER BREAST XXLRG (GAUZE/BANDAGES/DRESSINGS) IMPLANT
BIOPATCH RED 1 DISK 7.0 (GAUZE/BANDAGES/DRESSINGS) IMPLANT
BLADE HEX COATED 2.75 (ELECTRODE) ×1 IMPLANT
BLADE SURG 10 STRL SS (BLADE) ×1 IMPLANT
BLADE SURG 15 STRL LF DISP TIS (BLADE) ×1 IMPLANT
BLADE SURG 15 STRL SS (BLADE) ×1
BNDG ESMARK 4X9 LF (GAUZE/BANDAGES/DRESSINGS) ×1 IMPLANT
CANISTER SUCT 1200ML W/VALVE (MISCELLANEOUS) ×1 IMPLANT
CHLORAPREP W/TINT 26 (MISCELLANEOUS) ×1 IMPLANT
CLIP TI MEDIUM 6 (CLIP) ×2 IMPLANT
COVER MAYO STAND STRL (DRAPES) ×1 IMPLANT
COVER PROBE CYLINDRICAL 5X96 (MISCELLANEOUS) ×1 IMPLANT
DERMABOND ADVANCED .7 DNX12 (GAUZE/BANDAGES/DRESSINGS) ×1 IMPLANT
DRAIN CHANNEL 19F RND (DRAIN) ×1 IMPLANT
DRAPE UTILITY XL STRL (DRAPES) ×1 IMPLANT
DRSG TEGADERM 4X4.75 (GAUZE/BANDAGES/DRESSINGS) IMPLANT
ELECT BLADE 4.0 EZ CLEAN MEGAD (MISCELLANEOUS) ×1
ELECT REM PT RETURN 9FT ADLT (ELECTROSURGICAL) ×1
ELECTRODE BLDE 4.0 EZ CLN MEGD (MISCELLANEOUS) IMPLANT
ELECTRODE REM PT RTRN 9FT ADLT (ELECTROSURGICAL) ×1 IMPLANT
EVACUATOR SILICONE 100CC (DRAIN) ×1 IMPLANT
GAUZE PAD ABD 8X10 STRL (GAUZE/BANDAGES/DRESSINGS) ×2 IMPLANT
GAUZE SPONGE 4X4 12PLY STRL (GAUZE/BANDAGES/DRESSINGS) ×1 IMPLANT
GLOVE BIO SURGEON STRL SZ 6 (GLOVE) ×1 IMPLANT
GLOVE BIOGEL PI IND STRL 6.5 (GLOVE) ×1 IMPLANT
GLOVE BIOGEL PI IND STRL 7.0 (GLOVE) IMPLANT
GLOVE BIOGEL PI IND STRL 7.5 (GLOVE) IMPLANT
GLOVE SURG SS PI 6.5 STRL IVOR (GLOVE) IMPLANT
GLOVE SURG SYN 7.5 E (GLOVE) ×1 IMPLANT
GLOVE SURG SYN 7.5 PF PI (GLOVE) IMPLANT
GOWN STRL REUS W/ TWL LRG LVL3 (GOWN DISPOSABLE) ×1 IMPLANT
GOWN STRL REUS W/ TWL XL LVL3 (GOWN DISPOSABLE) ×1 IMPLANT
GOWN STRL REUS W/TWL LRG LVL3 (GOWN DISPOSABLE) ×2
GOWN STRL REUS W/TWL XL LVL3 (GOWN DISPOSABLE) ×1
LIGHT WAVEGUIDE WIDE FLAT (MISCELLANEOUS) IMPLANT
NDL HYPO 25X1 1.5 SAFETY (NEEDLE) ×1 IMPLANT
NDL SAFETY ECLIPSE 18X1.5 (NEEDLE) ×1 IMPLANT
NDL SPNL 18GX3.5 QUINCKE PK (NEEDLE) IMPLANT
NDL SPNL 22GX3.5 QUINCKE BK (NEEDLE) IMPLANT
NEEDLE HYPO 25X1 1.5 SAFETY (NEEDLE) ×1 IMPLANT
NEEDLE SPNL 18GX3.5 QUINCKE PK (NEEDLE) IMPLANT
NEEDLE SPNL 22GX3.5 QUINCKE BK (NEEDLE) IMPLANT
NS IRRIG 1000ML POUR BTL (IV SOLUTION) ×1 IMPLANT
PACK BASIN DAY SURGERY FS (CUSTOM PROCEDURE TRAY) ×1 IMPLANT
PACK UNIVERSAL I (CUSTOM PROCEDURE TRAY) ×1 IMPLANT
PENCIL SMOKE EVACUATOR (MISCELLANEOUS) ×1 IMPLANT
PIN SAFETY STERILE (MISCELLANEOUS) ×1 IMPLANT
SLEEVE SCD COMPRESS KNEE MED (STOCKING) ×1 IMPLANT
SPIKE FLUID TRANSFER (MISCELLANEOUS) IMPLANT
SPONGE T-LAP 18X18 ~~LOC~~+RFID (SPONGE) ×1 IMPLANT
STAPLER SKIN PROX WIDE 3.9 (STAPLE) IMPLANT
STOCKINETTE IMPERVIOUS LG (DRAPES) ×1 IMPLANT
STRIP CLOSURE SKIN 1/2X4 (GAUZE/BANDAGES/DRESSINGS) ×1 IMPLANT
SUT ETHILON 2 0 FS 18 (SUTURE) IMPLANT
SUT MNCRL AB 4-0 PS2 18 (SUTURE) ×2 IMPLANT
SUT SILK 0 TIES 10X30 (SUTURE) IMPLANT
SUT SILK 2 0 SH (SUTURE) ×1 IMPLANT
SUT VICRYL 3-0 CR8 SH (SUTURE) ×2 IMPLANT
SUT VICRYL AB 2 0 TIE (SUTURE) IMPLANT
SUT VICRYL AB 2 0 TIES (SUTURE)
SYR BULB IRRIG 60ML STRL (SYRINGE) ×1 IMPLANT
SYR CONTROL 10ML LL (SYRINGE) IMPLANT
TOWEL GREEN STERILE FF (TOWEL DISPOSABLE) ×1 IMPLANT
TRACER MAGTRACE VIAL (MISCELLANEOUS) IMPLANT
TUBE CONNECTING 20X1/4 (TUBING) ×1 IMPLANT
UNDERPAD 30X36 HEAVY ABSORB (UNDERPADS AND DIAPERS) ×1 IMPLANT
YANKAUER SUCT BULB TIP NO VENT (SUCTIONS) ×1 IMPLANT

## 2023-03-05 NOTE — Discharge Instructions (Signed)
[facility] [place] [phone]  MASTECTOMY: POST OP INSTRUCTIONS  Always review your discharge instruction sheet given to you by the facility where your surgery was performed. IF YOU HAVE DISABILITY OR FAMILY LEAVE FORMS, YOU MUST BRING THEM TO THE OFFICE FOR PROCESSING.   DO NOT GIVE THEM TO YOUR DOCTOR. A prescription for pain medication may be given to you upon discharge.  Take your pain medication as prescribed, if needed.  If narcotic pain medicine is not needed, then you may take acetaminophen (Tylenol) or ibuprofen (Advil) as needed. Take your usually prescribed medications unless otherwise directed. If you need a refill on your pain medication, please contact your pharmacy.  They will contact our office to request authorization.  Prescriptions will not be filled after 5pm or on week-ends. You should follow a light diet the first few days after arrival home, such as soup and crackers, etc.  Resume your normal diet the day after surgery. Most patients will experience some swelling and bruising on the chest and underarm.  Ice packs will help.  Swelling and bruising can take several days to resolve.  It is common to experience some constipation if taking pain medication after surgery.  Increasing fluid intake and taking a stool softener (such as Colace) will usually help or prevent this problem from occurring.  A mild laxative (Milk of Magnesia or Miralax) should be taken according to package instructions if there are no bowel movements after 48 hours. Unless discharge instructions indicate otherwise, leave your bandage dry and in place until your next appointment in 3-5 days.  You may take a limited sponge bath.  No tube baths or showers until the drains are removed.  You may have steri-strips (small skin tapes) in place directly over the incision.  These strips should be left on the skin for 7-10 days.  If your surgeon used skin glue on the incision, you may shower in 24 hours.   The glue will flake off over the next 2-3 weeks.  Any sutures or staples will be removed at the office during your follow-up visit. DRAINS:  If you have drains in place, it is important to keep a list of the amount of drainage produced each day in your drains.  Before leaving the hospital, you should be instructed on drain care.  Call our office if you have any questions about your drains. ACTIVITIES:  You may resume regular (light) daily activities beginning the next day--such as daily self-care, walking, climbing stairs--gradually increasing activities as tolerated.  You may have sexual intercourse when it is comfortable.  Refrain from any heavy lifting or straining until approved by your doctor. You may drive when you are no longer taking prescription pain medication, you can comfortably wear a seatbelt, and you can safely maneuver your car and apply brakes. RETURN TO WORK:  __________________________________________________________ You should see your doctor in the office for a follow-up appointment approximately 3-5 days after your surgery.  Your doctor's nurse will typically make your follow-up appointment when she calls you with your pathology report.  Expect your pathology report 2-3 business days after your surgery.  You may call to check if you do not hear from us after three days.   OTHER INSTRUCTIONS: ______________________________________________________________________________________________ ____________________________________________________________________________________________ WHEN TO CALL YOUR DOCTOR: Fever over 101.0 Nausea and/or vomiting Extreme swelling or bruising Continued bleeding from incision. Increased pain, redness, or drainage from the incision. The clinic staff is available to answer your questions during regular business hours.  Please don't hesitate   to call and ask to speak to one of the nurses for clinical concerns.  If you have a medical emergency, go to the  nearest emergency room or call 911.  A surgeon from [facility] is always on call at the hospital. [address] [place], [place], [place]  [address] ? [address], [place], [place]   [address] [phone] ? [phone] ? FAX [phone] Web site: [email or web address]   About my Jackson-Pratt Bulb Drain  What is a Jackson-Pratt bulb? A Jackson-Pratt is a soft, round device used to collect drainage. It is connected to a long, thin drainage catheter, which is held in place by one or two small stiches near your surgical incision site. When the bulb is squeezed, it forms a vacuum, forcing the drainage to empty into the bulb.  Emptying the Jackson-Pratt bulb- To empty the bulb: 1. Release the plug on the top of the bulb. 2. Pour the bulb's contents into a measuring container which your nurse will provide. 3. Record the time emptied and amount of drainage. Empty the drain(s) as often as your     doctor or nurse recommends.  Date                  Time                    Amount (Drain 1)                 Amount (Drain 2)  _____________________________________________________________________  _____________________________________________________________________  _____________________________________________________________________  _____________________________________________________________________  _____________________________________________________________________  _____________________________________________________________________  _____________________________________________________________________  _____________________________________________________________________  Squeezing the Jackson-Pratt Bulb- To squeeze the bulb: 1. Make sure the plug at the top of the bulb is open. 2. Squeeze the bulb tightly in your fist. You will hear air squeezing from the bulb. 3. Replace the plug while the bulb is squeezed. 4. Use a safety pin to attach the bulb to your clothing.  This will keep the catheter from     pulling at the bulb insertion site.  When to call your doctor- Call your doctor if: Drain site becomes red, swollen or hot. You have a fever greater than 101 degrees F. There is oozing at the drain site. Drain falls out (apply a guaze bandage over the drain hole and secure it with tape). Drainage increases daily not related to activity patterns. (You will usually have more drainage when you are active than when you are resting.) Drainage has a bad odor.

## 2023-03-05 NOTE — Interval H&P Note (Signed)
History and Physical Interval Note:  03/05/2023 8:25 AM  Lauren Hodge  has presented today for surgery, with the diagnosis of BILATERAL BREAST CANCER.  The various methods of treatment have been discussed with the patient and family. After consideration of risks, benefits and other options for treatment, the patient has consented to  Procedure(s) with comments: BILATERAL MASTECTOMIES WITH BILATERAL MAGTRACE INJECTIONS (Bilateral) - PEC BLOCK as a surgical intervention.  The patient's history has been reviewed, patient examined, no change in status, stable for surgery.  I have reviewed the patient's chart and labs.  Questions were answered to the patient's satisfaction.     Almond Lint

## 2023-03-05 NOTE — Progress Notes (Signed)
Assisted Dr. Neoma Laming with left, right, pectoralis, ultrasound guided block. Side rails up, monitors on throughout procedure. See vital signs in flow sheet. Tolerated Procedure well.

## 2023-03-05 NOTE — Anesthesia Procedure Notes (Signed)
Anesthesia Regional Block: Pectoralis block   Pre-Anesthetic Checklist: , timeout performed,  Correct Patient, Correct Site, Correct Laterality,  Correct Procedure, Correct Position, site marked,  Risks and benefits discussed,  Surgical consent,  Pre-op evaluation,  At surgeon's request and post-op pain management  Laterality: Left and Right  Prep: chloraprep       Needles:  Injection technique: Single-shot  Needle Type: Echogenic Stimulator Needle     Needle Length: 9cm  Needle Gauge: 21     Additional Needles:   Procedures:,,,, ultrasound used (permanent image in chart),,    Narrative:  Start time: 03/05/2023 8:48 AM End time: 03/05/2023 8:55 AM Injection made incrementally with aspirations every 5 mL.  Performed by: Personally  Anesthesiologist: Linton Rump, MD  Additional Notes: Discussed risks and benefits of nerve block including, but not limited to, prolonged and/or permanent nerve injury involving sensory and/or motor function. Monitors were applied and a time-out was performed. The nerve and associated structures were visualized under ultrasound guidance. After negative aspiration, local anesthetic was slowly injected around the nerve. There was no evidence of high pressure during the procedure. There were no paresthesias. VSS remained stable and the patient tolerated the procedure well.

## 2023-03-05 NOTE — Transfer of Care (Signed)
Immediate Anesthesia Transfer of Care Note  Patient: Lauren Hodge  Procedure(s) Performed: BILATERAL MASTECTOMIES WITH BILATERAL MAGTRACE INJECTIONS (Bilateral: Breast)  Patient Location: PACU  Anesthesia Type:GA combined with regional for post-op pain  Level of Consciousness: sedated  Airway & Oxygen Therapy: Patient Spontanous Breathing and Patient connected to face mask oxygen  Post-op Assessment: Report given to RN and Post -op Vital signs reviewed and stable  Post vital signs: Reviewed and stable  Last Vitals:  Vitals Value Taken Time  BP    Temp    Pulse    Resp    SpO2      Last Pain:  Vitals:   03/05/23 0714  TempSrc: Temporal  PainSc: 0-No pain         Complications: No notable events documented.

## 2023-03-05 NOTE — Anesthesia Postprocedure Evaluation (Signed)
Anesthesia Post Note  Patient: Lauren Hodge  Procedure(s) Performed: BILATERAL MASTECTOMIES WITH BILATERAL MAGTRACE INJECTIONS (Bilateral: Breast)     Patient location during evaluation: PACU Anesthesia Type: Regional and General Level of consciousness: awake Pain management: pain level controlled Vital Signs Assessment: post-procedure vital signs reviewed and stable Respiratory status: spontaneous breathing, nonlabored ventilation and respiratory function stable Cardiovascular status: blood pressure returned to baseline and stable Postop Assessment: no apparent nausea or vomiting Anesthetic complications: no   No notable events documented.  Last Vitals:  Vitals:   03/05/23 1242 03/05/23 1245  BP:  (!) 173/74  Pulse: 76 77  Resp: 14 12  Temp:    SpO2: 93% 94%    Last Pain:  Vitals:   03/05/23 1242  TempSrc:   PainSc: 2                  Linton Rump

## 2023-03-05 NOTE — Op Note (Signed)
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 The risks, benefits, complications, treatment options, and expected outcomes were discussed with the patient. The possibilities of reaction to medication, pulmonary aspiration, bleeding, infection, the need for additional procedures, failure to diagnose a condition, and creating a complication requiring transfusion or operation were discussed with the patient. The patient concurred with the proposed plan, giving informed consent.  The site of surgery properly noted/marked. The patient was taken to Operating Room # 8, identified as Lauren Hodge and the procedure verified as bilateral Mastectomies with magtrace injection. After induction of anesthesia, the bilateral breast and chest were prepped and draped in standard fashion. A Time Out was held and the above information confirmed.  The magtrace was injected in the subareolar location bilaterally.     The borders of the breast were identified and marked. The left side was addressed first.  The incision was drawn out to make sure incision lines were equidistant in length.    The superior incision was made with the #10 blade.  Mastectomy hooks were used to provide elevation of the skin edges, and the cautery was used to create the mastectomy flaps.  The dissection was taken to the fascia of the pectoralis major.  The penetrating vessels were clipped as needed.  The superior flap  was taken medially to the lateral sternal border, superiorly to the inferior border of the clavicle.  The inferior flap was similarly created, inferiorly to the inframammary fold and laterally to the border of the latissimus.  The breast was taken off including the pectoralis fascia and the axillary tail marked.      The wound was irrigated.  Hemostasis was achieved with cautery.  A TXA soaked lap was placed into the wound while the other side was addressed.  One 19 Blake drain was placed laterally and secured with a 2-0 nylon.    The right side was then addressed similarly.  The breast was larger on that side so the incision was longer.  Hemostasis was achieved with cautery.  A TXA soaked lap was also inserted into the wound and pressure held for 3 minutes.  This side was also irrigated.  A 19 Fr blake drain was also placed on the right side and secured with a 2-0 nylon.  Both sides were reinspected for hemostasis prior to closure.    The skin was then closed with a 3-0 Vicryl deep dermal interrupted sutures and 4-0 Vicryl subcuticular closure in layers.    Sterile dressings were applied. At the end of the operation, all sponge, instrument, and needle counts were correct.  Findings: grossly clear surgical margins  Estimated Blood Loss: min          Drains: 19 Fr blake drains in bilateral chest wall                Specimens: left breast and right breast         Complications:  None; patient tolerated the procedure well.         Disposition: PACU - hemodynamically stable.  Condition: stable

## 2023-03-05 NOTE — H&P (Signed)
PROVIDER: Matthias Hughs, MD Patient Care Team: Wanda Plump, MD as PCP - General (Internal Medicine) Matthias Hughs, MD as Consulting Provider (Surgical Oncology)  MRN: [id number] DOB: 1956-01-13 DATE OF ENCOUNTER: 02/11/2023  Chief Complaint: Discuss breast surgery   History of Present Illness: Lauren Hodge is a 67 y.o. female who is seen today for breast cancer follow up.  Initial history:   Patient presented with right breast cancer diagnosed September 2024. Patient had spontaneous bloody nipple discharge. She underwent diagnostic imaging. Diagnostic mammogram showed 3.7 cm of linearly oriented calcifications in the retroareolar portion of the breast. There was also a 5 mm mass at 3:00 in the retroareolar location. There were some indeterminate calcifications in the upper inner breast. Patient underwent core needle biopsy of the retroareolar calcifications in the retroareolar mass. This showed intermediate to high-grade ductal carcinoma in situ. Receptors were not yet complete. Patient has another biopsy planned presumably for the upper inner calcifications.  Patient does not have any personal or family history of breast cancer before this. Patient is accompanied by her husband. Telephone Spanish interpreter is utilized for the visit.   Family cancer history - maternal aunt had tongue cancer and maternal grandmother had vaginal cancer  Interval history:   Pt had additional biopsies. There were two additional right biopsies that were benign and there was a left sided biopsy that was positive for DCIS. She then had an MRI due to the number of small issues. She subsequently had multiple other findings and had two additional right biopsies and one additional left biopsy. These were all benign and concordant.   I spoke to radiology and it did appear that there were only the central areas of DCIS given all the other benign findings. However, her MRI was described as very  busy and MRI would be recommended annually if breast conservation was pursued.  Patient has had genetic testing drawn, but it is not back yet.   Review of Systems: A complete review of systems was obtained from the patient. I have reviewed this information and discussed as appropriate with the patient. See HPI as well for other ROS.  Review of Systems  All other systems reviewed and are negative.   Medical History: Past Medical History:  Diagnosis Date  GERD (gastroesophageal reflux disease)  Hypertension   Patient Active Problem List  Diagnosis  Malignant neoplasm of central portion of right female breast (CMS/HHS-HCC)  Malignant neoplasm of central portion of left breast in female, estrogen receptor positive (CMS/HHS-HCC)   Past Surgical History:  Procedure Laterality Date  CESAREAN SECTION  CHOLECYSTECTOMY  about 2014    No Known Allergies  Current Outpatient Medications on File Prior to Visit  Medication Sig Dispense Refill  amoxicillin-clavulanate (AUGMENTIN) 500-125 mg tablet Take 1 tablet by mouth 3 (three) times daily  losartan (COZAAR) 50 MG tablet Take 50 mg by mouth once daily  metoprolol succinate (TOPROL-XL) 25 MG XL tablet Take by mouth  pantoprazole (PROTONIX) 40 MG DR tablet Take by mouth   No current facility-administered medications on file prior to visit.   Family History  Problem Relation Age of Onset  High blood pressure (Hypertension) Mother    Social History   Tobacco Use  Smoking Status Never  Smokeless Tobacco Never    Social History   Socioeconomic History  Marital status: Married  Tobacco Use  Smoking status: Never  Smokeless tobacco: Never  Substance and Sexual Activity  Alcohol use: Never  Drug use: Never  Social Drivers of Health   Food Insecurity: No Food Insecurity (07/27/2020)  [facility]  Hunger Vital Sign  Worried About Running Out of Food in the Last Year: Never true  Ran Out of Food in the Last  Year: Never true  Transportation Needs: No Transportation Needs (01/20/2023)  Received from [facility] - Transportation  Lack of Transportation (Medical): No  Lack of Transportation (Non-Medical): No  Received from [facility]  Social Network   Objective:   Vitals:  02/11/23 0935  PainSc: 2   There is no height or weight on file to calculate BMI.  Head: Normocephalic and atraumatic.  Eyes: Conjunctivae are normal. Pupils are equal, round, and reactive to light. No scleral icterus.  Neck: Normal range of motion. Neck supple. No tracheal deviation present. No thyromegaly present.  Resp: No respiratory distress, normal effort. Breast: Right breast slightly larger than left. Both breast have some old bruising that is healing. I cannot elicit the right nipple discharge. No palpable lymphadenopathy Abd: Abdomen is soft, non distended and non tender. No masses are palpable. There is no rebound and no guarding.  Neurological: Alert and oriented to person, place, and time. Coordination normal.  Skin: Skin is warm and dry. No rash noted. No diaphoretic. No erythema. No pallor.  Psychiatric: Normal mood and affect. Normal behavior. Judgment and thought content normal.   Labs, Imaging and Diagnostic Testing:  None new  Assessment and Plan:   Diagnoses and all orders for this visit:  Malignant neoplasm of central portion of left breast in female, estrogen receptor positive (CMS/HHS-HCC)  Malignant neoplasm of central portion of right breast in female, estrogen receptor positive (CMS/HHS-HCC)  Patient has bilateral breast cancer, stage 0. Both sides are intermediate to high-grade DCIS with hormone positivity. Patient does desire bilateral mastectomies. She is offered plastic surgery referral for reconstruction and declines.  She is anxiously awaiting her genetics results. She has a 36 year old daughter and wants to make sure that appropriate screening recommendations are  available for her.  With the Spanish interpreter I discussed bilateral mastectomies. I reviewed the incisions, boundaries of surgery, risk of bleeding and possible need for additional procedures or blood transfusion, drain placement, need to keep track of drain output, possible risk of chronic tightness and discomfort, possible risk of heart or lung complications. I reviewed that I would still want opinion of medical oncology regarding whether or not antihormonal treatment is recommended. However as long as we get negative margins with DCIS, no radiation would be indicated.

## 2023-03-05 NOTE — Anesthesia Procedure Notes (Signed)
Procedure Name: Intubation Date/Time: 03/05/2023 9:10 AM  Performed by: Ronnette Hila, CRNAPre-anesthesia Checklist: Patient identified, Emergency Drugs available, Suction available and Patient being monitored Patient Re-evaluated:Patient Re-evaluated prior to induction Oxygen Delivery Method: Circle system utilized Preoxygenation: Pre-oxygenation with 100% oxygen Induction Type: IV induction Ventilation: Mask ventilation without difficulty Laryngoscope Size: Mac and 3 Grade View: Grade II Tube type: Oral Tube size: 7.0 mm Number of attempts: 1 Airway Equipment and Method: Stylet and Oral airway Placement Confirmation: ETT inserted through vocal cords under direct vision, positive ETCO2 and breath sounds checked- equal and bilateral Secured at: 21 cm Tube secured with: Tape Dental Injury: Teeth and Oropharynx as per pre-operative assessment

## 2023-03-06 ENCOUNTER — Encounter (HOSPITAL_BASED_OUTPATIENT_CLINIC_OR_DEPARTMENT_OTHER): Payer: Self-pay | Admitting: General Surgery

## 2023-03-06 ENCOUNTER — Other Ambulatory Visit (HOSPITAL_BASED_OUTPATIENT_CLINIC_OR_DEPARTMENT_OTHER): Payer: Self-pay

## 2023-03-06 DIAGNOSIS — Z8249 Family history of ischemic heart disease and other diseases of the circulatory system: Secondary | ICD-10-CM | POA: Diagnosis not present

## 2023-03-06 DIAGNOSIS — Z79899 Other long term (current) drug therapy: Secondary | ICD-10-CM | POA: Diagnosis not present

## 2023-03-06 DIAGNOSIS — K449 Diaphragmatic hernia without obstruction or gangrene: Secondary | ICD-10-CM | POA: Diagnosis not present

## 2023-03-06 DIAGNOSIS — M810 Age-related osteoporosis without current pathological fracture: Secondary | ICD-10-CM | POA: Diagnosis not present

## 2023-03-06 DIAGNOSIS — D0511 Intraductal carcinoma in situ of right breast: Secondary | ICD-10-CM | POA: Diagnosis not present

## 2023-03-06 DIAGNOSIS — D0512 Intraductal carcinoma in situ of left breast: Secondary | ICD-10-CM | POA: Diagnosis not present

## 2023-03-06 DIAGNOSIS — Z170 Estrogen receptor positive status [ER+]: Secondary | ICD-10-CM | POA: Diagnosis not present

## 2023-03-06 DIAGNOSIS — I10 Essential (primary) hypertension: Secondary | ICD-10-CM | POA: Diagnosis not present

## 2023-03-06 DIAGNOSIS — K219 Gastro-esophageal reflux disease without esophagitis: Secondary | ICD-10-CM | POA: Diagnosis not present

## 2023-03-06 MED ORDER — IBUPROFEN 600 MG PO TABS
600.0000 mg | ORAL_TABLET | Freq: Three times a day (TID) | ORAL | 0 refills | Status: AC
  Filled 2023-03-06: qty 40, 14d supply, fill #0

## 2023-03-06 NOTE — Discharge Summary (Signed)
Physician Discharge Summary  Patient ID: RUSSIA CIABURRI MRN: [id number] DOB/AGE: 65-May-1957 65 y.o.  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   Malignant neoplasm of central portion of female breast (HCC) 01/14/2023   Trigger finger, right middle finger 09/11/2021   Annual physical exam 10/31/2020   PCP NOTES >>>>>>>>>>>>>>>>>> 08/26/2020   GERD (gastroesophageal reflux disease) 07/26/2020   Hyperlipidemia 10/06/2019   Vitamin D deficiency 06/18/2018   Hypertensive disorder 05/18/2018    Discharge Diagnoses:  Principal Problem:   Bilateral breast cancer [facility]) And same as above  Discharged Condition: stable  Hospital Course:  Patient was admitted to the [facility] following bilateral mastectomy with magtrace injection 03/05/23. She did well overnight in terms of pain control.  She did not have nausea/vomiting.  She was ambulatory and able to void.  Family was educated in drain care.  She was discharged to home in stable condition POD 1.    Consults: None  Significant Diagnostic Studies: labs: none  Treatments: surgery: see above  Discharge Exam: Blood pressure (!) 124/55, pulse 68, temperature 98.6 F (37 C), resp. rate 18, height 5\' 1"  (1.549 m), weight 75.6 kg, SpO2 96%. General appearance: alert, cooperative, and no distress Resp: breathing comfortably Chest wall: anticipated bilateral chest wall tenderness.  No evidence of flap hematoma.  Drains serosang.   Disposition: Discharge disposition: 01-Home or Self Care       Discharge Instructions     Call MD for:  difficulty breathing, headache or visual disturbances   Complete by: As directed    Call MD for:  hives   Complete by: As directed    Call MD for:   persistant nausea and vomiting   Complete by: As directed    Call MD for:  redness, tenderness, or signs of infection (pain, swelling, redness, odor or green/yellow discharge around incision site)   Complete by: As directed    Call MD for:  severe uncontrolled pain   Complete by: As directed    Call MD for:  temperature >100.4   Complete by: As directed    Diet - low sodium heart healthy   Complete by: As directed    Increase activity slowly   Complete by: As directed       Allergies as of 03/06/2023   No Known Allergies      Medication List     TAKE these medications    acetaminophen 325 MG tablet Commonly known as: TYLENOL Take 650 mg by mouth every 6 (six) hours as needed.   CENTRUM ADULTS PO Take by mouth.   gabapentin 100 MG capsule Commonly known as: NEURONTIN Take 1 capsule (100 mg total) by mouth 2 (two) times daily.   ibuprofen 600 MG tablet Commonly known as: ADVIL Take 1 tablet (600 mg total) by mouth 3 (three) times daily for 13 days. Take ibuprofen three times a day for a week, then move to as needed. What changed:  when to take this additional instructions   losartan 50 MG tablet Commonly known as: COZAAR Tome 1 tableta (50 mg en total) por va oral diariamente. (Take 1 tablet (50 mg total) by mouth daily.)   methocarbamol 500 MG tablet Commonly known as: ROBAXIN Take 1 tablet (500 mg total) by  mouth every 6 (six) hours as needed for muscle spasms.   metoprolol succinate 25 MG 24 hr tablet Commonly known as: TOPROL-XL Take 1 tablet (25 mg total) by mouth daily. Take with or immediately following a meal   ondansetron 4 MG disintegrating tablet Commonly known as: ZOFRAN-ODT Take 1 tablet (4 mg total) by mouth every 6 (six) hours as needed for nausea.   oxyCODONE 5 MG immediate release tablet Commonly known as: Oxy IR/ROXICODONE Take 1-2 tablets (5-10 mg total) by mouth every 4 (four) hours as needed for moderate pain (pain score 4-6).    pantoprazole 40 MG tablet Commonly known as: PROTONIX Tome 1 tableta (40 mg en total) por va oral diariamente antes de desayunar. (Take 1 tablet (40 mg total) by mouth daily before breakfast.)   predniSONE 20 MG tablet Commonly known as: DELTASONE Tome 1 tableta (20 mg en total) por va oral diariamente con el desayuno. (Take 1 tablet (20 mg total) by mouth daily with breakfast.)   VITAMIN D (CHOLECALCIFEROL) PO Take by mouth.        Follow-up Information     Almond Lint, MD Follow up in 2 week(s).   Specialty: General Surgery Contact information: [address] [place] [address] [place] [address] [phone]                 Signed: Almond Lint 03/06/2023, 11:42 AM

## 2023-03-07 DIAGNOSIS — C50111 Malignant neoplasm of central portion of right female breast: Secondary | ICD-10-CM | POA: Diagnosis not present

## 2023-03-07 DIAGNOSIS — C50912 Malignant neoplasm of unspecified site of left female breast: Secondary | ICD-10-CM | POA: Diagnosis not present

## 2023-03-10 ENCOUNTER — Encounter: Payer: Self-pay | Admitting: *Deleted

## 2023-03-10 LAB — SURGICAL PATHOLOGY

## 2023-03-12 ENCOUNTER — Ambulatory Visit (INDEPENDENT_AMBULATORY_CARE_PROVIDER_SITE_OTHER): Payer: Medicare HMO | Admitting: Internal Medicine

## 2023-03-12 ENCOUNTER — Encounter: Payer: Self-pay | Admitting: Internal Medicine

## 2023-03-12 VITALS — BP 122/82 | HR 67 | Temp 98.1°F | Resp 16 | Ht 61.0 in | Wt 168.2 lb

## 2023-03-12 DIAGNOSIS — E559 Vitamin D deficiency, unspecified: Secondary | ICD-10-CM | POA: Diagnosis not present

## 2023-03-12 DIAGNOSIS — I10 Essential (primary) hypertension: Secondary | ICD-10-CM | POA: Diagnosis not present

## 2023-03-12 DIAGNOSIS — E785 Hyperlipidemia, unspecified: Secondary | ICD-10-CM | POA: Diagnosis not present

## 2023-03-12 DIAGNOSIS — Z0000 Encounter for general adult medical examination without abnormal findings: Secondary | ICD-10-CM | POA: Diagnosis not present

## 2023-03-12 NOTE — Patient Instructions (Addendum)
Vaccines I recommend: Covid booster Shingrix (shingles) Flu shot Pneumonia vaccine    Call for blood work when ready Next visit with me  in 6 months      Please schedule it at the front desk

## 2023-03-12 NOTE — Assessment & Plan Note (Signed)
HTN: On metoprolol, BP elevated today, recheck: Still elevated.  Heart rate 64.  Plan: Add losartan 50 mg, BMP 2 weeks, start monitoring BPs at home. GERD: Under excellent control. RUQ abdominal pain: Started several months ago. Has a hard time describing it, increase with food?  Increase with certain torso movements?   History of cholecystectomy History of transient increased LFTs, [place] 2022:   fatty liver. UTD colon cancer screening EGD unremarkable 2020. Plan: CT abdomen and pelvis, refer to GI.  IBS? RTC CPX 3 months.

## 2023-03-12 NOTE — Progress Notes (Signed)
Subjective:    Patient ID: Lauren Hodge, female    DOB: 04/24/1955, 67 y.o.   MRN: [id number]  DOS:  03/12/2023 Type of visit - description: CPX, here with her husband  Since the last office visit, was Dx with breast cancer, had recent surgery. Doing well emotionally. Denies any fever, chills. No wound discharge or warmness. No difficulty breathing, no lower extremity edema Post op pain as expected . No f/c  Review of Systems  Other than above, a 14 point review of systems is negative     Past Medical History:  Diagnosis Date   Carpal tunnel syndrome    Family history of prostate cancer    Fracture of carpal bone 2018   bilateral   GERD (gastroesophageal reflux disease)    Hemorrhoids    Hiatal hernia 02/2019   seen on EGD    History of left breast biopsy    Benign   HTN (hypertension)    Hyperlipidemia    Insomnia    Osteoporosis    Vitamin D deficiency     Past Surgical History:  Procedure Laterality Date   BREAST BIOPSY Left 2010   x2, 2010-2012 (no cancer)   BREAST BIOPSY Right 01/10/2023   [place] RT BREAST BX W LOC DEV 1ST LESION IMG BX SPEC US GUIDE 01/10/2023 [facility]   BREAST BIOPSY Right 01/10/2023   MM RT BREAST BX W LOC DEV 1ST LESION IMAGE BX SPEC STEREO GUIDE 01/10/2023 [facility]   BREAST BIOPSY Right 01/22/2023   MM RT BREAST BX W LOC DEV 1ST LESION IMAGE BX SPEC STEREO GUIDE 01/22/2023 [facility]   BREAST BIOPSY Right 01/22/2023   MM RT BREAST BX W LOC DEV EA AD LESION IMG BX SPEC STEREO GUIDE 01/22/2023 [facility]   BREAST BIOPSY Left 01/22/2023   MM LT BREAST BX W LOC DEV 1ST LESION IMAGE BX SPEC STEREO GUIDE 01/22/2023 [facility]   BREAST EXCISIONAL BIOPSY Left    CHOLECYSTECTOMY  2015   HEMORRHOIDECTOMY WITH HEMORRHOID BANDING  04/22/2017   SIMPLE MASTECTOMY WITH AXILLARY SENTINEL NODE BIOPSY Bilateral 03/05/2023   Procedure: BILATERAL MASTECTOMIES WITH BILATERAL MAGTRACE INJECTIONS;  Surgeon: Almond Lint, MD;  Location: [facility];  Service: General;  Laterality: Bilateral;  PEC BLOCK   Social History   Socioeconomic History   Marital status: Married    Spouse name: Not on file   Number of children: 2   Years of education: Not on file   Highest education level: Not on file  Occupational History   Occupation: retired   Tobacco Use   Smoking status: Former    Current packs/day: 0.00    Types: Cigarettes    Quit date: 08/25/2018    Years since quitting: 4.5   Smokeless tobacco: Never   Tobacco comments:    used to smoke rarely   Substance and Sexual Activity   Alcohol use: Not Currently    Comment: occasional   Drug use: Not Currently   Sexual activity: Not Currently  Other Topics Concern   Not on file  Social History Narrative   From [place] from [place] 2019   Household: pt and husband; daughter    Social Determinants of Corporate investment banker Strain: Not on file  Food Insecurity: No Food Insecurity (07/27/2020)   Received from [facility], [facility]   Hunger Vital Sign    Worried About Running Out of Food in the Last Year: Never true  Ran Out of Food in the Last Year: Never true  Transportation Needs: No Transportation Needs (01/20/2023)   PRAPARE - Administrator, Civil Service (Medical): No    Lack of Transportation (Non-Medical): No  Physical Activity: Not on file  Stress: Not on file  Social Connections: Unknown (09/04/2021)   Received from [facility], [facility]   Social Network    Social Network: Not on file  Intimate Partner Violence: Not At Risk (01/20/2023)   Humiliation, Afraid, Rape, and Kick questionnaire    Fear of Current or Ex-Partner: No    Emotionally Abused: No    Physically Abused: No    Sexually Abused: No    Current Outpatient Medications  Medication Instructions   acetaminophen (TYLENOL) 650 mg, Oral, Every 6 hours PRN   gabapentin (NEURONTIN) 100 mg, Oral, 2 times daily   ibuprofen  (ADVIL) 600 mg, Oral, 3 times daily, Take ibuprofen three times a day for a week, then move to as needed.   losartan (COZAAR) 50 mg, Oral, Daily   methocarbamol (ROBAXIN) 500 mg, Oral, Every 6 hours PRN   metoprolol succinate (TOPROL-XL) 25 mg, Oral, Daily, Take with or immediately following a meal   Multiple Vitamins-Minerals (CENTRUM ADULTS PO) Oral   ondansetron (ZOFRAN-ODT) 4 mg, Oral, Every 6 hours PRN   oxyCODONE (OXY IR/ROXICODONE) 5-10 mg, Oral, Every 4 hours PRN   pantoprazole (PROTONIX) 40 mg, Oral, Daily before breakfast   VITAMIN D, CHOLECALCIFEROL, PO Oral       Objective:   Physical Exam BP 122/82   Pulse 67   Temp 98.1 F (36.7 C) (Oral)   Resp 16   Ht 5\' 1"  (1.549 m)   Wt 168 lb 4 oz (76.3 kg)   SpO2 97%   BMI 31.79 kg/m  General: Well developed, NAD, BMI noted HEENT:  Normocephalic . Face symmetric, atraumatic Lungs:  CTA B Normal respiratory effort, no intercostal retractions, no accessory muscle use. Abdomen:  Not distended, soft, non-tender. No rebound or rigidity.   Lower extremities: no pretibial edema bilaterally  Skin: Exposed areas without rash. Not pale. Not jaundice Neurologic:  alert & oriented X3.  Speech normal, gait appropriate for age and unassisted Strength symmetric and appropriate for age.  Psych: Cognition and judgment appear intact.  Cooperative with normal attention span and concentration.  Behavior appropriate. No anxious or depressed appearing.     Assessment    ASSESSMENT. new patient 08/2020 HTN  amlodipine: Intolerant palpation, headache, edema.  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  US liver 2022: Likely  steatosis Breast cancer: Bilateral mastectomy 03/05/2023.  PLAN Here for CPX Tdap: 2022 REC: flu-covid-PNM - shingrix >>> declines for now CCS: Had a colonoscopy 02-2019, next 5 years  per GI letter  H/o breast ca Last PAP 11/06/2020 Labs: Declines lab today due to recent mastectomy, will call when ready: BMP AST ALT FLP vitamin D  CBC Breast cancer: Since LOV was dx w/ breast cancer, s/p  B mastectomy last week.  Besides post op  pain, denies fever chills or discharge.  Takes oxycodone at night and alternates Tylenol and ibuprofen. She had questions about wound care, recommend to reach out to  surgeon if needed. HTN: BP today is very good, continue losartan, metoprolol. Vitamin D deficiency: On supplements, checking labs. RTC for labs when patient ready. RTC checkup 6 months.

## 2023-03-14 ENCOUNTER — Encounter: Payer: Self-pay | Admitting: Internal Medicine

## 2023-03-14 NOTE — Assessment & Plan Note (Signed)
Here for CPX Tdap: 2022 REC: flu-covid-PNM - shingrix >>> declines for now CCS: Had a colonoscopy 02-2019, next 5 years  per GI letter  H/o breast ca Last PAP 11/06/2020 Labs: Declines lab today due to recent mastectomy, will call when ready: BMP AST ALT FLP vitamin D  CBC

## 2023-03-14 NOTE — Assessment & Plan Note (Signed)
Here for CPX Breast cancer: Since LOV was dx w/ breast cancer, s/p  B mastectomy last week.  Besides post op  pain, denies fever chills or discharge.  Takes oxycodone at night and alternates Tylenol and ibuprofen. She had questions about wound care, recommend to reach out to surgeon if needed. HTN: BP today is very good, continue losartan, metoprolol. Vitamin D deficiency: On supplements, checking labs. RTC for labs when patient ready. RTC checkup 6 months.

## 2023-03-17 ENCOUNTER — Other Ambulatory Visit (HOSPITAL_BASED_OUTPATIENT_CLINIC_OR_DEPARTMENT_OTHER): Payer: Self-pay

## 2023-03-17 MED ORDER — OXYCODONE HCL 5 MG PO TABS
ORAL_TABLET | ORAL | 0 refills | Status: DC
  Filled 2023-03-17: qty 20, 2d supply, fill #0

## 2023-03-24 ENCOUNTER — Other Ambulatory Visit (HOSPITAL_BASED_OUTPATIENT_CLINIC_OR_DEPARTMENT_OTHER): Payer: Self-pay

## 2023-03-26 ENCOUNTER — Ambulatory Visit: Payer: Medicare HMO | Admitting: Hematology and Oncology

## 2023-03-27 ENCOUNTER — Encounter: Payer: Self-pay | Admitting: *Deleted

## 2023-03-27 ENCOUNTER — Inpatient Hospital Stay: Payer: Medicare HMO | Attending: Hematology and Oncology | Admitting: Hematology and Oncology

## 2023-03-27 VITALS — BP 182/84 | HR 72 | Temp 98.3°F | Resp 15 | Wt 165.5 lb

## 2023-03-27 DIAGNOSIS — D0511 Intraductal carcinoma in situ of right breast: Secondary | ICD-10-CM

## 2023-03-27 DIAGNOSIS — Z170 Estrogen receptor positive status [ER+]: Secondary | ICD-10-CM | POA: Insufficient documentation

## 2023-03-27 DIAGNOSIS — Z79899 Other long term (current) drug therapy: Secondary | ICD-10-CM | POA: Insufficient documentation

## 2023-03-27 DIAGNOSIS — Z9013 Acquired absence of bilateral breasts and nipples: Secondary | ICD-10-CM | POA: Diagnosis not present

## 2023-03-27 NOTE — Progress Notes (Signed)
[facility] CONSULT NOTE  Patient Care Team: Wanda Plump, MD as PCP - General (Internal Medicine) Rachael Fee, MD as Attending Physician (Gastroenterology) Rachel Moulds, MD as Consulting Physician (Hematology and Oncology) Pershing Proud, RN as Oncology Nurse Navigator Donnelly Angelica, RN as Oncology Nurse Navigator Almond Lint, MD as Consulting Physician (General Surgery) Lonie Peak, MD as Attending Physician (Radiation Oncology)  CHIEF COMPLAINTS/PURPOSE OF CONSULTATION:  Newly diagnosed breast cancer  HISTORY OF PRESENTING ILLNESS:  Lauren Hodge 67 y.o. female is here because of recent diagnosis of right breast  I reviewed her records extensively and collaborated the history with the patient.  SUMMARY OF ONCOLOGIC HISTORY: Oncology History  Ductal carcinoma in situ (DCIS) of right breast  12/31/2022 Mammogram   Patient had diagnostic mammogram for unilateral spontaneous bloody nipple discharge and this showed 3.7 cm linearly oriented group of round and punctate calcs in the retroareolar right breast there is also a 5 x 5 x 7 mm solid and cystic mass with associated calcs right breast 3:00 retroareolar location   01/10/2023 Pathology Results   Right breast needle core biopsy at 3:00 retroareolar showed DCIS intermediate to high-grade, right breast needle core biopsy retroareolar coil clip showed DCIS intermediate to high-grade, prognostic showed ER 100% positive strong staining, PR 85% positive strong staining   01/21/2023 Initial Diagnosis   Ductal carcinoma in situ (DCIS) of right breast   01/22/2023 Mammogram   Mammogram of the left breast showed 3.3 cm group of indeterminate calcs in the upper outer anterior left breast   01/22/2023 Pathology Results   Right breast needle core biopsy upper inner clip showed benign breast tissue with fibrocystic change and focal columnar cell change involving mild adenosis and associated calcs.  Right breast  upper outer Venus clip showed benign breast tissue with extensive sclerosing adenosis and prominent fibrocystic change with associated calcs and a focal incidental radial scar.  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  The report date is February 17, 2023.  The CancerNext-Expanded gene panel offered by [facility] and includes sequencing and rearrangement analysis for the following 77 genes: AIP, ALK, APC*, ATM*, AXIN2, BAP1, BARD1, BMPR1A, BRCA1*, BRCA2*, BRIP1*, CDC73, CDH1*, CDK4, CDKN1B, CDKN2A, CHEK2*, CTNNA1, DICER1, FH, FLCN, KIF1B, LZTR1, MAX, MEN1, MET, MLH1*, MSH2*, MSH3, MSH6*, MUTYH*, NF1*, NF2, NTHL1, PALB2*, PHOX2B, PMS2*, POT1, PRKAR1A, PTCH1, PTEN*, RAD51C*, RAD51D*, RB1, RET, SDHA, SDHAF2, SDHB, SDHC, SDHD, SMAD4, SMARCA4, SMARCB1, SMARCE1, STK11, SUFU, TMEM127, TP53*, TSC1, TSC2, and VHL (sequencing and deletion/duplication); EGFR, EGLN1, HOXB13, KIT, MITF, PDGFRA, POLD1, and POLE (sequencing only); EPCAM and GREM1 (deletion/duplication only). DNA and RNA analyses performed for * genes.     Discussed the use of AI scribe software for clinical note transcription with the patient, who gave verbal consent to proceed.  History of Present Illness    The patient, with a history of bilateral breast cancer, presents for a post-operative follow-up after bilateral mastectomy and right axillary lymph node surgery. She reports ongoing healing and pain at the surgical sites. She also reports high blood pressure. She denies any desire for breast reconstruction once healing is complete.  The patient also reports taking pain medication prescribed by her surgeon. She expresses gratitude  and  relief upon learning that all cancer was successfully removed during surgery, and no further treatment such as radiation or antiestrogen therapy is required.  MEDICAL HISTORY:  Past Medical History:  Diagnosis Date   Carpal tunnel syndrome    Family history of prostate cancer    Fracture of carpal bone 2018   bilateral   GERD (gastroesophageal reflux disease)    Hemorrhoids    Hiatal hernia 02/2019   seen on EGD    History of left breast biopsy    Benign   HTN (hypertension)    Hyperlipidemia    Insomnia    Osteoporosis    Vitamin D deficiency     SURGICAL HISTORY: Past Surgical History:  Procedure Laterality Date   BREAST BIOPSY Left 2010   x2, 2010-2012 (no cancer)   BREAST BIOPSY Right 01/10/2023   [place] RT BREAST BX W LOC DEV 1ST LESION IMG BX SPEC US GUIDE 01/10/2023 [facility]   BREAST BIOPSY Right 01/10/2023   MM RT BREAST BX W LOC DEV 1ST LESION IMAGE BX SPEC STEREO GUIDE 01/10/2023 [facility]   BREAST BIOPSY Right 01/22/2023   MM RT BREAST BX W LOC DEV 1ST LESION IMAGE BX SPEC STEREO GUIDE 01/22/2023 [facility]   BREAST BIOPSY Right 01/22/2023   MM RT BREAST BX W LOC DEV EA AD LESION IMG BX SPEC STEREO GUIDE 01/22/2023 [facility]   BREAST BIOPSY Left 01/22/2023   MM LT BREAST BX W LOC DEV 1ST LESION IMAGE BX SPEC STEREO GUIDE 01/22/2023 [facility]   BREAST EXCISIONAL BIOPSY Left    CHOLECYSTECTOMY  2015   HEMORRHOIDECTOMY WITH HEMORRHOID BANDING  04/22/2017   SIMPLE MASTECTOMY WITH AXILLARY SENTINEL NODE BIOPSY Bilateral 03/05/2023   Procedure: BILATERAL MASTECTOMIES WITH BILATERAL MAGTRACE INJECTIONS;  Surgeon: Almond Lint, MD;  Location: [facility];  Service: General;  Laterality: Bilateral;  PEC BLOCK    SOCIAL HISTORY: Social History   Socioeconomic History   Marital status: Married    Spouse name: Not on file   Number of children: 2   Years of education: Not on file   Highest education level: Not on file   Occupational History   Occupation: retired   Tobacco Use   Smoking status: Former    Current packs/day: 0.00    Types: Cigarettes    Quit date: 08/25/2018    Years since quitting: 4.5   Smokeless tobacco: Never   Tobacco comments:    used to smoke rarely   Substance and Sexual Activity   Alcohol use: Not Currently    Comment: occasional   Drug use: Not Currently   Sexual activity: Not Currently  Other Topics Concern   Not on file  Social History Narrative   From [place] from [place] 2019   Household: pt and husband; daughter    Social Determinants of Corporate investment banker Strain: Not on file  Food Insecurity: No Food Insecurity (07/27/2020)   Received from [facility], [facility]   Hunger Vital Sign    Worried About Running Out of Food in the Last Year: Never true    Ran Out of Food in the Last Year: Never true  Transportation Needs: No Transportation Needs (01/20/2023)   PRAPARE - Administrator, Civil Service (Medical): No    Lack of Transportation (Non-Medical): No  Physical Activity: Not on file  Stress: Not on file  Social Connections: Unknown (09/04/2021)   Received [facility], [facility]  Social Network    Social Network: Not on file  Intimate Partner Violence: Not At Risk (01/20/2023)   Humiliation, Afraid, Rape, and Kick questionnaire    Fear of Current or Ex-Partner: No    Emotionally Abused: No    Physically Abused: No    Sexually Abused: No    FAMILY HISTORY: Family History  Problem Relation Age of Onset   Hypertension Mother    Prostate cancer Father    Tongue cancer Maternal Aunt    Cervical cancer Maternal Grandmother    CAD Neg Hx    Diabetes Neg Hx    Colon cancer Neg Hx    Breast cancer Neg Hx     ALLERGIES:  has No Known Allergies.  MEDICATIONS:  Current Outpatient Medications  Medication Sig Dispense Refill   acetaminophen (TYLENOL) 325 MG tablet Take 650 mg by mouth every 6 (six) hours as needed.      gabapentin (NEURONTIN) 100 MG capsule Take 1 capsule (100 mg total) by mouth 2 (two) times daily. 30 capsule 1   losartan (COZAAR) 50 MG tablet Take 1 tablet (50 mg total) by mouth daily. 90 tablet 1   methocarbamol (ROBAXIN) 500 MG tablet Take 1 tablet (500 mg total) by mouth every 6 (six) hours as needed for muscle spasms. 20 tablet 1   metoprolol succinate (TOPROL-XL) 25 MG 24 hr tablet Take 1 tablet (25 mg total) by mouth daily. Take with or immediately following a meal 90 tablet 1   Multiple Vitamins-Minerals (CENTRUM ADULTS PO) Take by mouth.     ondansetron (ZOFRAN-ODT) 4 MG disintegrating tablet Take 1 tablet (4 mg total) by mouth every 6 (six) hours as needed for nausea. 20 tablet 0   oxyCODONE (OXY IR/ROXICODONE) 5 MG immediate release tablet Take 1-2 tablets (5-10 mg total) by mouth every 4 (four) hours as needed for moderate pain (pain score 4-6). 20 tablet 0   oxyCODONE (OXY IR/ROXICODONE) 5 MG immediate release tablet Take 1-2 tablets (5-10 mg total) by mouth every 4 (four) hours as needed for Pain (as needed for moderate pain (pain score 4-6)) 20 tablet 0   pantoprazole (PROTONIX) 40 MG tablet Take 1 tablet (40 mg total) by mouth daily before breakfast. 90 tablet 1   VITAMIN D, CHOLECALCIFEROL, PO Take by mouth.     No current facility-administered medications for this visit.    REVIEW OF SYSTEMS:   Constitutional: Denies fevers, chills or abnormal night sweats Eyes: Denies blurriness of vision, double vision or watery eyes Ears, nose, mouth, throat, and face: Denies mucositis or sore throat Respiratory: Denies cough, dyspnea or wheezes Cardiovascular: Denies palpitation, chest discomfort or lower extremity swelling Gastrointestinal:  Denies nausea, heartburn or change in bowel habits Skin: Denies abnormal skin rashes Lymphatics: Denies new lymphadenopathy or easy bruising Neurological:Denies numbness, tingling or new weaknesses Behavioral/Psych: Mood is stable, no new  changes  Breast: Denies any palpable lumps or discharge All other systems were reviewed with the patient and are negative.  PHYSICAL EXAMINATION: ECOG PERFORMANCE STATUS: 0 - Asymptomatic  Vitals:   03/27/23 0845 03/27/23 0850  BP: (!) 214/99 (!) 182/84  Pulse:    Resp:    Temp:    SpO2:      Filed Weights   03/27/23 0842  Weight: 165 lb 8 oz (75.1 kg)     GENERAL:alert, no distress and comfortable Bilateral mastectomy, drains in place.  LABORATORY DATA:  I have reviewed the data as listed Lab Results  Component Value Date  WBC 7.3 07/08/2022   HGB 12.9 07/08/2022   HCT 37.8 07/08/2022   MCV 86.7 07/08/2022   PLT 240.0 07/08/2022   Lab Results  Component Value Date   NA 140 12/18/2022   K 4.1 12/18/2022   CL 104 12/18/2022   CO2 30 12/18/2022    RADIOGRAPHIC STUDIES: I have personally reviewed the radiological reports and agreed with the findings in the report.  ASSESSMENT AND PLAN:  Ductal carcinoma in situ (DCIS) of right breast This is a very pleasant 67 year old female patient with newly diagnosed bilateral DCIS ER/PR positive referred to medical oncology for additional recommendations.    Breast Cancer Bilateral mastectomy with lymph node dissection on the right side. Pathology report confirms complete removal of cancer with no lymph node involvement. No need for further treatment including radiation or antiestrogen therapy. -Annual follow-up for 5 years.  Postoperative Pain Management Patient is currently taking prescribed pain medication from her surgeon. -Continue pain medication as prescribed until finished.  Hypertension Elevated blood pressure noted during visit. -Repeat blood pressure measurement on the forearm due to lymph node dissection.  Breast Reconstruction Patient has declined breast reconstruction post-mastectomy. -No further action required.  RTC in 1 yr.    All questions were answered. The patient knows to call the clinic  with any problems, questions or concerns.    Rachel Moulds, MD 03/27/23

## 2023-03-27 NOTE — Assessment & Plan Note (Addendum)
This is a very pleasant 67 year old female patient with newly diagnosed bilateral DCIS ER/PR positive referred to medical oncology for additional recommendations.    Breast Cancer Bilateral mastectomy with lymph node dissection on the right side. Pathology report confirms complete removal of cancer with no lymph node involvement. No need for further treatment including radiation or antiestrogen therapy. -Annual follow-up for 5 years.  Postoperative Pain Management Patient is currently taking prescribed pain medication from her surgeon. -Continue pain medication as prescribed until finished.  Hypertension Elevated blood pressure noted during visit. -Repeat blood pressure measurement on the forearm due to lymph node dissection.  Breast Reconstruction Patient has declined breast reconstruction post-mastectomy. -No further action required.  RTC in 1 yr.

## 2023-03-28 ENCOUNTER — Other Ambulatory Visit (HOSPITAL_BASED_OUTPATIENT_CLINIC_OR_DEPARTMENT_OTHER): Payer: Self-pay

## 2023-03-28 MED ORDER — GABAPENTIN 100 MG PO CAPS
100.0000 mg | ORAL_CAPSULE | Freq: Two times a day (BID) | ORAL | 3 refills | Status: DC
  Filled 2023-03-28: qty 60, 30d supply, fill #0

## 2023-03-28 MED ORDER — OXYCODONE HCL 5 MG PO TABS
5.0000 mg | ORAL_TABLET | Freq: Four times a day (QID) | ORAL | 0 refills | Status: DC | PRN
  Filled 2023-03-28: qty 20, 5d supply, fill #0

## 2023-04-01 ENCOUNTER — Encounter: Payer: Self-pay | Admitting: Physical Therapy

## 2023-04-01 ENCOUNTER — Other Ambulatory Visit: Payer: Self-pay

## 2023-04-01 ENCOUNTER — Ambulatory Visit: Payer: Medicare HMO | Attending: General Surgery | Admitting: Physical Therapy

## 2023-04-01 DIAGNOSIS — R293 Abnormal posture: Secondary | ICD-10-CM | POA: Diagnosis not present

## 2023-04-01 DIAGNOSIS — Z483 Aftercare following surgery for neoplasm: Secondary | ICD-10-CM | POA: Insufficient documentation

## 2023-04-01 DIAGNOSIS — Z170 Estrogen receptor positive status [ER+]: Secondary | ICD-10-CM | POA: Diagnosis not present

## 2023-04-01 DIAGNOSIS — M25612 Stiffness of left shoulder, not elsewhere classified: Secondary | ICD-10-CM | POA: Insufficient documentation

## 2023-04-01 DIAGNOSIS — C50112 Malignant neoplasm of central portion of left female breast: Secondary | ICD-10-CM | POA: Insufficient documentation

## 2023-04-01 DIAGNOSIS — C50111 Malignant neoplasm of central portion of right female breast: Secondary | ICD-10-CM | POA: Diagnosis not present

## 2023-04-01 DIAGNOSIS — M25611 Stiffness of right shoulder, not elsewhere classified: Secondary | ICD-10-CM | POA: Insufficient documentation

## 2023-04-01 NOTE — Therapy (Signed)
OUTPATIENT PHYSICAL THERAPY  UPPER EXTREMITY ONCOLOGY EVALUATION  Patient Name: Lauren Hodge MRN: [id number] DOB:[birth year], 67 y.o., female Today's Date: 04/01/2023  END OF SESSION:  PT End of Session - 04/01/23 0855     Visit Number 1    Number of Visits 9    Date for PT Re-Evaluation 04/29/23    PT Start Time 0807    PT Stop Time 0855    PT Time Calculation (min) 48 min    Activity Tolerance Patient tolerated treatment well    Behavior During Therapy WFL for tasks assessed/performed             Past Medical History:  Diagnosis Date   Carpal tunnel syndrome    Family history of prostate cancer    Fracture of carpal bone 2018   bilateral   GERD (gastroesophageal reflux disease)    Hemorrhoids    Hiatal hernia 02/2019   seen on EGD    History of left breast biopsy    Benign   HTN (hypertension)    Hyperlipidemia    Insomnia    Osteoporosis    Vitamin D deficiency    Past Surgical History:  Procedure Laterality Date   BREAST BIOPSY Left 2010   x2, 2010-2012 (no cancer)   BREAST BIOPSY Right 01/10/2023   [place] RT BREAST BX W LOC DEV 1ST LESION IMG BX SPEC US GUIDE 01/10/2023 [facility]   BREAST BIOPSY Right 01/10/2023   MM RT BREAST BX W LOC DEV 1ST LESION IMAGE BX SPEC STEREO GUIDE 01/10/2023 [facility]   BREAST BIOPSY Right 01/22/2023   MM RT BREAST BX W LOC DEV 1ST LESION IMAGE BX SPEC STEREO GUIDE 01/22/2023 [facility]   BREAST BIOPSY Right 01/22/2023   MM RT BREAST BX W LOC DEV EA AD LESION IMG BX SPEC STEREO GUIDE 01/22/2023 [facility]   BREAST BIOPSY Left 01/22/2023   MM LT BREAST BX W LOC DEV 1ST LESION IMAGE BX SPEC STEREO GUIDE 01/22/2023 [facility]   BREAST EXCISIONAL BIOPSY Left    CHOLECYSTECTOMY  2015   HEMORRHOIDECTOMY WITH HEMORRHOID BANDING  04/22/2017   SIMPLE MASTECTOMY WITH AXILLARY SENTINEL NODE BIOPSY Bilateral 03/05/2023   Procedure: BILATERAL MASTECTOMIES WITH BILATERAL MAGTRACE INJECTIONS;  Surgeon:  Almond Lint, MD;  Location: [facility];  Service: General;  Laterality: Bilateral;  PEC BLOCK   Patient Active Problem List   Diagnosis Date Noted   Bilateral breast cancer (HCC) 03/05/2023   Family history of prostate cancer    Genetic testing 02/07/2023   Ductal carcinoma in situ (DCIS) of right breast 01/21/2023   Malignant neoplasm of central portion of female breast (HCC) 01/14/2023   Trigger finger, right middle finger 09/11/2021   Annual physical exam 10/31/2020   PCP NOTES >>>>>>>>>>>>>>>>>> 08/26/2020   GERD (gastroesophageal reflux disease) 07/26/2020   Hyperlipidemia 10/06/2019   Vitamin D deficiency 06/18/2018   Hypertensive disorder 05/18/2018    PCP: Willow Ora, MD  REFERRING PROVIDER: Almond Lint, MD  REFERRING DIAG:  C50.111,Z17.0 (ICD-10-CM) - Malignant neoplasm of central portion of right breast in female, estrogen receptor positive (HCC) C50.112,Z17.0 (ICD-10-CM) - Malignant neoplasm of central portion of left breast in female, estrogen receptor positive (HCC)  THERAPY DIAG:  Stiffness of right shoulder, not elsewhere classified  Stiffness of left shoulder, not elsewhere classified  Aftercare following surgery for neoplasm  Abnormal posture  Malignant neoplasm of central portion of right breast in female, estrogen receptor positive (HCC)  Malignant neoplasm of central  portion of left breast in female, estrogen receptor positive (HCC)  ONSET DATE: 03/05/23  Rationale for Evaluation and Treatment: Rehabilitation  SUBJECTIVE:                                                                                                                                                                                           SUBJECTIVE STATEMENT: I can't move my arms. I have not done any exercises or stretches. I had the last drain taken out 3 days ago. I have noticed my balance is not as good since the surgery.   PERTINENT HISTORY: 03/05/23: bilateral  mastectomies and R SLNB,on L breast cancer: DCIS, grade 2, greatest dimension: 2.5 mm, ER: 95%, positive, PR: 60%. R breast cancer: DCIS grade 2, greatest dimension 7 mm, ER: 100%, positive, PR: 85%, positive, SLNB 0/4 on R, high blood pressure  PAIN:  Are you having pain? Yes NPRS scale: 7-8/10 Pain location: bilateral chest Pain orientation: Bilateral  PAIN TYPE: burning and sharp Pain description: intermittent, sharp, burning, and stabbing  Aggravating factors: moving Relieving factors: healing and pain medication  PRECAUTIONS: osteoporosis  RED FLAGS: None   WEIGHT BEARING RESTRICTIONS: No  FALLS:  Has patient fallen in last 6 months? No  LIVING ENVIRONMENT: Lives with: lives with their family - mom, husband, and daughter Lives in: House/apartment Has following equipment at home: None  OCCUPATION: retired  LEISURE: before the surgery exercised in my home a little - walked, did UE and LE exercises, 2x/wk  HAND DOMINANCE: right   PRIOR LEVEL OF FUNCTION: Independent  PATIENT GOALS: to be able to do all the things I did before - to cook, clean house, hand crafts   OBJECTIVE: Note: Objective measures were completed at Evaluation unless otherwise noted.  COGNITION: Overall cognitive status: Within functional limits for tasks assessed   OBSERVATIONS / OTHER ASSESSMENTS: healing mastectomy scars with steri strips intact, gauze over bilateral drain sites due to still draining   POSTURE: forward head, rounded shoulders  UPPER EXTREMITY AROM/PROM:  A/PROM RIGHT   eval   Shoulder extension 45  Shoulder flexion 73  Shoulder abduction 38  Shoulder internal rotation unable  Shoulder external rotation unable    (Blank rows = not tested)  A/PROM LEFT   eval  Shoulder extension 50  Shoulder flexion 98  Shoulder abduction 71  Shoulder internal rotation unable  Shoulder external rotation unable    (Blank rows = not tested)   UPPER EXTREMITY STRENGTH:    LYMPHEDEMA ASSESSMENTS:   SURGERY TYPE/DATE: 03/05/23- Bilateral mastectomies for bilateral breast cancer  NUMBER OF LYMPH NODES REMOVED: R 0/4  CHEMOTHERAPY: none  RADIATION:none  HORMONE TREATMENT: none currently  INFECTIONS: none   LYMPHEDEMA ASSESSMENTS:   LANDMARK RIGHT  eval  At axilla  35.5  15 cm proximal to olecranon process 30.5  10 cm proximal to olecranon process 28.9  Olecranon process 25.5  15 cm proximal to ulnar styloid process 24.5  10 cm proximal to ulnar styloid process 22.1  Just proximal to ulnar styloid process 17  Across hand at thumb web space 18.9  At base of 2nd digit 6.5  (Blank rows = not tested)  LANDMARK LEFT  eval  At axilla  34.5  15 cm proximal to olecranon process 30.5  10 cm proximal to olecranon process 29  Olecranon process 25.2  15 cm proximal to ulnar styloid process 24.5  10 cm proximal to ulnar styloid process 22.2  Just proximal to ulnar styloid process 17  Across hand at thumb web space 18.5  At base of 2nd digit 6.2  (Blank rows = not tested)     QUICK DASH SURVEY:   Neldon Mc - 04/01/23 0001     Open a tight or new jar Unable    Do heavy household chores (wash walls, wash floors) Unable    Carry a shopping bag or briefcase Unable    Wash your back Unable    Use a knife to cut food Unable    Recreational activities in which you take some force or impact through your arm, shoulder, or hand (golf, hammering, tennis) Unable    During the past week, to what extent has your arm, shoulder or hand problem interfered with your normal social activities with family, friends, neighbors, or groups? Extremely    During the past week, to what extent has your arm, shoulder or hand problem limited your work or other regular daily activities Extremely    Arm, shoulder, or hand pain. Severe    Tingling (pins and needles) in your arm, shoulder, or hand Extreme    Difficulty Sleeping Severe difficulty    DASH Score 95.45 %                TODAY'S TREATMENT:                                                                                                                                          DATE:  04/01/23: educated pt in post op breast exercises but educated her to hold off for 4 more days due to recent drain removal    PATIENT EDUCATION:  Education details: lymphedema, post op breast exercises Person educated: Patient Education method: Chief Technology Officer Education comprehension: verbalized understanding  HOME EXERCISE PROGRAM: Post op breast exercises beginning 1 week after last drain removed  ASSESSMENT:  CLINICAL IMPRESSION: Patient is a 67 y.o. female who was seen today for physical therapy evaluation and treatment for decreased bilateral shoulder ROM following bilateral mastectomies and R SLNB (0/4)  for treatment of bilateral breast cancer. Pt is very limited with bilateral shoulder ROM following her mastectomies. She has not moved her arms much since surgery. She has more pain on the R side secondary to the lymph node biopsy. Educated pt in lymphedema risk today. Issued post op breast exercises to begin in another 4 days since she just had her last drain removed 3 days ago and it is still draining. Pt reports her balance has been off since her surgery and she has increased difficulty with balance on the stairs and first thing in the morning. Pt would benefit from skilled PT services to improve bilateral shoulder ROM, decrease pain, improve posture, and improve balance.     OBJECTIVE IMPAIRMENTS: decreased balance, decreased knowledge of condition, decreased knowledge of use of DME, decreased ROM, decreased strength, increased fascial restrictions, impaired UE functional use, postural dysfunction, and pain.   ACTIVITY LIMITATIONS: carrying, lifting, stairs, bathing, dressing, and reach over head  PARTICIPATION LIMITATIONS: meal prep, cleaning, laundry, community activity, and yard  work  PERSONAL FACTORS:  none  are also affecting patient's functional outcome.   REHAB POTENTIAL: Good  CLINICAL DECISION MAKING: Stable/uncomplicated  EVALUATION COMPLEXITY: Low  GOALS: Goals reviewed with patient? Yes  SHORT TERM GOALS=LONG TERM GOALS Target date: 04/29/23  Pt will demonstrate 160 degrees of bilateral shoulder flexion to allow her to reach overhead.  Baseline: R 73, L 98 Goal status: INITIAL  2.  Pt will demonstrate 160 degrees of bilateral shoulder abduction to allow her to reach out to the side. Baseline: R 38, L 71 Goal status: INITIAL  3.  Pt will report no pain across bilateral upper quadrants to allow improved comfort.  Baseline:  Goal status: INITIAL  4.  Pt will report no difficulty ascending/descending steps as a result of balance to decrease fall risk.  Baseline:  Goal status: INITIAL  5.  Pt will be independent in a home exercise program for long term stretching and strengthening.  Baseline:  Goal status: INITIAL  6.  Pt will be able to verbalize lymphedema risk reduction practices to decrease risk of lymphedema.  Baseline:  Goal status: INITIAL   PLAN:  PT FREQUENCY: 2x/week  PT DURATION: 4 weeks  PLANNED INTERVENTIONS: [id number]- PT Re-evaluation, 97110-Therapeutic exercises, 97530- Therapeutic activity, 97112- Neuromuscular re-education, 97535- Self Care, 86578- Manual therapy, [id number]- Orthotic Fit/training, Patient/Family education, Balance training, Joint mobilization, Manual lymph drainage, Scar mobilization, Therapeutic exercises, Therapeutic activity, Neuromuscular re-education, Gait training, and Self Care  PLAN FOR NEXT SESSION: begin gentle PROM to bilateral shoulders, AAROM exercises, educate in lymphedema risk reduction practices and issue spanish handouts about lymphedema and risk reduction (same handouts as ABC class - they are up front in bin), eventually work on high level balance  [employer], PT 04/01/2023, 9:56  AM

## 2023-04-02 ENCOUNTER — Other Ambulatory Visit: Payer: Self-pay

## 2023-04-02 ENCOUNTER — Encounter: Payer: Self-pay | Admitting: Gastroenterology

## 2023-04-02 ENCOUNTER — Ambulatory Visit: Payer: Medicare HMO | Admitting: Gastroenterology

## 2023-04-02 ENCOUNTER — Other Ambulatory Visit (HOSPITAL_BASED_OUTPATIENT_CLINIC_OR_DEPARTMENT_OTHER): Payer: Self-pay

## 2023-04-02 VITALS — BP 130/90 | HR 68 | Ht 59.0 in | Wt 165.0 lb

## 2023-04-02 DIAGNOSIS — K449 Diaphragmatic hernia without obstruction or gangrene: Secondary | ICD-10-CM | POA: Diagnosis not present

## 2023-04-02 DIAGNOSIS — Z8719 Personal history of other diseases of the digestive system: Secondary | ICD-10-CM | POA: Diagnosis not present

## 2023-04-02 DIAGNOSIS — K219 Gastro-esophageal reflux disease without esophagitis: Secondary | ICD-10-CM | POA: Diagnosis not present

## 2023-04-02 DIAGNOSIS — K5792 Diverticulitis of intestine, part unspecified, without perforation or abscess without bleeding: Secondary | ICD-10-CM

## 2023-04-02 DIAGNOSIS — Z1501 Genetic susceptibility to malignant neoplasm of breast: Secondary | ICD-10-CM

## 2023-04-02 DIAGNOSIS — Z853 Personal history of malignant neoplasm of breast: Secondary | ICD-10-CM

## 2023-04-02 DIAGNOSIS — Z1509 Genetic susceptibility to other malignant neoplasm: Secondary | ICD-10-CM | POA: Diagnosis not present

## 2023-04-02 DIAGNOSIS — D0511 Intraductal carcinoma in situ of right breast: Secondary | ICD-10-CM

## 2023-04-02 DIAGNOSIS — Z860100 Personal history of colon polyps, unspecified: Secondary | ICD-10-CM | POA: Diagnosis not present

## 2023-04-02 MED ORDER — PANTOPRAZOLE SODIUM 20 MG PO TBEC
20.0000 mg | DELAYED_RELEASE_TABLET | Freq: Every day | ORAL | 3 refills | Status: DC
  Filled 2023-04-02 (×2): qty 90, 90d supply, fill #0
  Filled 2023-07-29: qty 90, 90d supply, fill #1

## 2023-04-02 NOTE — Patient Instructions (Addendum)
_______________________________________________________  If your blood pressure at your visit was 140/90 or greater, please contact your primary care physician to follow up on this.  _______________________________________________________  If you are age 65 or older, your body mass index should be between 23-30. Your Body mass index is 33.33 kg/m. If this is out of the aforementioned range listed, please consider follow up with your Primary Care Provider.  If you are age 61 or younger, your body mass index should be between 19-25. Your Body mass index is 33.33 kg/m. If this is out of the aformentioned range listed, please consider follow up with your Primary Care Provider.  _______________________________________________________  The [facility] providers would like to encourage you to use [facility] to communicate with providers for non-urgent requests or questions.  Due to long hold times on the telephone, sending your provider a message by [facility] may be a faster and more efficient way to get a response.  Please allow 48 business hours for a response.  Please remember that this is for non-urgent requests.  _______________________________________________________  We will contact you to schedule colonoscopy in March 2025.  We have sent the following medications to your pharmacy for you to pick up at your convenience:  REDUCE: pantoprazole to 20mg  one tablet daily  It was a pleasure to see you today!  Vito Cirigliano, D.O.

## 2023-04-02 NOTE — Progress Notes (Signed)
Chief Complaint: GERD, heartburn, abdominal pain, discuss Pancreatic Cancer screening   Referring Provider:     Wanda Plump, MD   HPI:     Lauren Hodge is a 67 y.o. female with a history of right breast cancer diagnosed in 12/2022 s/p bilateral mastectomies with bilateral mag trace injection left/13/24, HTN, cholecystectomy, referred to the Gastroenterology Clinic for evaluation of ATM gene mutation and consideration for Pancreatic Cancer screening.   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   -01/09/2023: CT A/P: Normal liver, pancreas, spleen.  Small sliding-type hiatal hernia, rare sigmoid diverticulosis with associated mesenteric stranding raising question of focal diverticulitis.  Remainder of GI tract normal-appearing.  Separately, she does have a history of GERD, heartburn. Currently taking pantoprazole 40 mg, but takes prn. Takes rarely, maybe 1/week.  However, will use Tums 4/7 days.  No dysphagia.  History obtained via Spanish interpreter in the room.  Endoscopic History: - 2015: Colonoscopy [facility]): 20 mm pedunculated polyp removed from transverse colon, 30 mm sessile polyp in transverse colon removed piecemeal with site tattooed.  Path not available for review - 03/22/2019: EGD: Small hiatal hernia, otherwise normal - 03/22/2019: Colonoscopy: Located tattoo site in descending colon without residual or recurrent polyp, 2 mm sigmoid benign polyp, otherwise normal.  Repeat in 5 years  Maternal uncle with fatty liver. Otherwise, no known family history of CRC, GI malignancy, liver disease, pancreatic disease, or IBD.     Past Medical History:  Diagnosis Date   Breast mass    Carpal tunnel syndrome    Family history of prostate cancer    Fracture of carpal bone 2018   bilateral   GERD (gastroesophageal  reflux disease)    Hemorrhoids    Hiatal hernia 02/2019   seen on EGD    History of left breast biopsy    Benign   HTN (hypertension)    Hyperlipidemia    Insomnia    Osteoporosis    Osteoporosis    Vitamin D deficiency      Past Surgical History:  Procedure Laterality Date   BIOPSY THYROID     BREAST BIOPSY Left 2010   x2, 2010-2012 (no cancer)   BREAST BIOPSY Right 01/10/2023   [place] RT BREAST BX W LOC DEV 1ST LESION IMG BX SPEC US GUIDE 01/10/2023 [facility]   BREAST BIOPSY Right 01/10/2023   MM RT BREAST BX W LOC DEV 1ST LESION IMAGE BX SPEC STEREO GUIDE 01/10/2023 [facility]   BREAST BIOPSY Right 01/22/2023   MM RT BREAST BX W LOC DEV 1ST LESION IMAGE BX SPEC STEREO GUIDE 01/22/2023 [facility]   BREAST BIOPSY Right 01/22/2023   MM RT BREAST BX W LOC DEV EA AD LESION IMG BX SPEC STEREO GUIDE 01/22/2023 [facility]   BREAST BIOPSY Left 01/22/2023   MM LT BREAST BX W LOC DEV 1ST LESION IMAGE BX SPEC STEREO GUIDE 01/22/2023 [facility]   BREAST EXCISIONAL BIOPSY Left    CHOLECYSTECTOMY  2015   HEMORRHOIDECTOMY WITH HEMORRHOID BANDING  04/22/2017   SIMPLE MASTECTOMY WITH AXILLARY SENTINEL NODE BIOPSY Bilateral 03/05/2023   Procedure: BILATERAL MASTECTOMIES WITH BILATERAL MAGTRACE INJECTIONS;  Surgeon: Almond Lint, MD;  Location: [facility];  Service: General;  Laterality: Bilateral;  PEC BLOCK   Family History  Problem Relation Age  of Onset   Hypertension Mother    Prostate cancer Father    Cervical cancer Maternal Grandmother    Tongue cancer Maternal Aunt    Liver disease Neg Hx    Colon cancer Neg Hx    Esophageal cancer Neg Hx    Social History   Tobacco Use   Smoking status: Former    Current packs/day: 0.00    Types: Cigarettes    Quit date: 08/25/2018    Years since quitting: 4.6   Smokeless tobacco: Never   Tobacco comments:    used to smoke rarely   Vaping Use   Vaping status: Never Used  Substance Use  Topics   Alcohol use: Not Currently    Comment: occasional   Drug use: Not Currently   Current Outpatient Medications  Medication Sig Dispense Refill   acetaminophen (TYLENOL) 325 MG tablet Take 650 mg by mouth every 6 (six) hours as needed.     gabapentin (NEURONTIN) 100 MG capsule Take 1 capsule (100 mg total) by mouth 2 (two) times daily. 60 capsule 3   losartan (COZAAR) 50 MG tablet Take 1 tablet (50 mg total) by mouth daily. 90 tablet 1   methocarbamol (ROBAXIN) 500 MG tablet Take 1 tablet (500 mg total) by mouth every 6 (six) hours as needed for muscle spasms. 20 tablet 1   metoprolol succinate (TOPROL-XL) 25 MG 24 hr tablet Take 1 tablet (25 mg total) by mouth daily. Take with or immediately following a meal 90 tablet 1   Multiple Vitamins-Minerals (CENTRUM ADULTS PO) Take by mouth.     ondansetron (ZOFRAN-ODT) 4 MG disintegrating tablet Take 1 tablet (4 mg total) by mouth every 6 (six) hours as needed for nausea. 20 tablet 0   oxyCODONE (OXY IR/ROXICODONE) 5 MG immediate release tablet Take 1-2 tablets (5-10 mg total) by mouth every 4 (four) hours as needed for moderate pain (pain score 4-6). 20 tablet 0   oxyCODONE (OXY IR/ROXICODONE) 5 MG immediate release tablet Take 1-2 tablets (5-10 mg total) by mouth every 4 (four) hours as needed for Pain (as needed for moderate pain (pain score 4-6)) 20 tablet 0   oxyCODONE (OXY IR/ROXICODONE) 5 MG immediate release tablet Take 1 tablet (5 mg total) by mouth every 6 (six) hours as needed for Pain (as needed for moderate pain (pain score 4-6)) 20 tablet 0   pantoprazole (PROTONIX) 40 MG tablet Take 1 tablet (40 mg total) by mouth daily before breakfast. 90 tablet 1   VITAMIN D, CHOLECALCIFEROL, PO Take by mouth.     No current facility-administered medications for this visit.   No Known Allergies   Review of Systems: All systems reviewed and negative except where noted in HPI.     Physical Exam:    Wt Readings from Last 3 Encounters:   04/02/23 165 lb (74.8 kg)  03/27/23 165 lb 8 oz (75.1 kg)  03/12/23 168 lb 4 oz (76.3 kg)    BP (!) 130/90   Pulse 68   Ht 4\' 11"  (1.499 m)   Wt 165 lb (74.8 kg)   BMI 33.33 kg/m  Constitutional:  Pleasant, in no acute distress. Psychiatric: Normal mood and affect. Behavior is normal. Cardiovascular: Normal rate, regular rhythm. No edema Pulmonary/chest: Effort normal and breath sounds normal. No wheezing, rales or rhonchi. Abdominal: Soft, nondistended, nontender. Bowel sounds active throughout. There are no masses palpable. No hepatomegaly. Neurological: Alert and oriented to person place and time. Skin: Skin is warm and dry. No  rashes noted.   ASSESSMENT AND PLAN;   1) GERD 2) Hiatal hernia Longstanding history of GERD and small hiatal hernia on previous EGD and recent CT. - Restart regular acid suppression therapy with pantoprazole 20 mg daily.  If suboptimal response, can increase to 40 mg daily - Antireflux lifestyle/dietary modifications - If reflux symptoms not improving with regular PPI use, can consider repeat upper endoscopy to reevaluate for erosive esophagitis and change in size/severity of hiatal hernia  3) History of colon polyps - Due for repeat colonoscopy in 2025 for ongoing polyp surveillance  4) History of diverticulitis Episode of acute, uncomplicated diverticulitis in 12/2022.  Good response to antimicrobial therapy. - Repeat colonoscopy 8+ weeks after completion of antibiotics.  Given her recent surgery (bilateral mastectomies), she would like to delay a little to allow postoperative healing.  Will tentatively schedule colonoscopy to be done in 06/2023  5) History of breast cancer - Continue follow-up in the Oncology and Surgical Oncology clinics  6) ATM gene mutation Recent genetic testing done due to diagnosis of breast cancer notable for single, heterogenous pathogenic ATM gene mutation.  Discussed Pancreatic Cancer screening guidelines at length  today with patient and spouse.  Current guidelines recommend Pancreatic Cancer screening for carriers of ATM gene mutation in patients that also have at least 1 affected first-degree relative.  She has no known family history of Pancreatic Cancer.  After in-depth conversation and shared decision making, do not plan to initiate PC screening at this juncture.  Can certainly revisit this recommendation as there was guidelines continue to evolve.  She is in complete agreement for observation alone without need to initiate PC screening based on current multisociety guideline recommendations.   The indications, risks, and benefits of colonoscopy were explained to the patient in detail. Risks include but are not limited to bleeding, perforation, adverse reaction to medications, and cardiopulmonary compromise. Sequelae include but are not limited to the possibility of surgery, hospitalization, and mortality. The patient verbalized understanding and wished to proceed. All questions answered, referred to the scheduler and bowel prep ordered. Further recommendations pending results of the exam.     Shellia Cleverly, DO, FACG  04/02/2023, 10:49 AM   Wanda Plump, MD

## 2023-04-03 ENCOUNTER — Ambulatory Visit: Payer: Medicare HMO | Admitting: Nurse Practitioner

## 2023-04-07 ENCOUNTER — Other Ambulatory Visit (HOSPITAL_BASED_OUTPATIENT_CLINIC_OR_DEPARTMENT_OTHER): Payer: Self-pay

## 2023-04-08 ENCOUNTER — Ambulatory Visit: Payer: Medicare HMO

## 2023-04-08 DIAGNOSIS — Z483 Aftercare following surgery for neoplasm: Secondary | ICD-10-CM | POA: Diagnosis not present

## 2023-04-08 DIAGNOSIS — C50112 Malignant neoplasm of central portion of left female breast: Secondary | ICD-10-CM | POA: Diagnosis not present

## 2023-04-08 DIAGNOSIS — R293 Abnormal posture: Secondary | ICD-10-CM | POA: Diagnosis not present

## 2023-04-08 DIAGNOSIS — C50111 Malignant neoplasm of central portion of right female breast: Secondary | ICD-10-CM | POA: Diagnosis not present

## 2023-04-08 DIAGNOSIS — M25612 Stiffness of left shoulder, not elsewhere classified: Secondary | ICD-10-CM

## 2023-04-08 DIAGNOSIS — Z170 Estrogen receptor positive status [ER+]: Secondary | ICD-10-CM | POA: Diagnosis not present

## 2023-04-08 DIAGNOSIS — M25611 Stiffness of right shoulder, not elsewhere classified: Secondary | ICD-10-CM

## 2023-04-08 NOTE — Therapy (Signed)
OUTPATIENT PHYSICAL THERAPY  UPPER EXTREMITY ONCOLOGY TREATMENT  Patient Name: Lauren Hodge MRN: [id number] DOB:1955/06/15, 67 y.o., female Today's Date: 04/08/2023  END OF SESSION:  PT End of Session - 04/08/23 0908     Visit Number 2    Number of Visits 9    Date for PT Re-Evaluation 04/29/23    PT Start Time 0904    PT Stop Time 1000    PT Time Calculation (min) 56 min    Activity Tolerance Patient tolerated treatment well    Behavior During Therapy WFL for tasks assessed/performed             Past Medical History:  Diagnosis Date   Breast mass    Carpal tunnel syndrome    Family history of prostate cancer    Fracture of carpal bone 2018   bilateral   GERD (gastroesophageal reflux disease)    Hemorrhoids    Hiatal hernia 02/2019   seen on EGD    History of left breast biopsy    Benign   HTN (hypertension)    Hyperlipidemia    Insomnia    Osteoporosis    Osteoporosis    Vitamin D deficiency    Past Surgical History:  Procedure Laterality Date   BIOPSY THYROID     BREAST BIOPSY Left 2010   x2, 2010-2012 (no cancer)   BREAST BIOPSY Right 01/10/2023   [place] RT BREAST BX W LOC DEV 1ST LESION IMG BX SPEC US GUIDE 01/10/2023 [facility]   BREAST BIOPSY Right 01/10/2023   MM RT BREAST BX W LOC DEV 1ST LESION IMAGE BX SPEC STEREO GUIDE 01/10/2023 [facility]   BREAST BIOPSY Right 01/22/2023   MM RT BREAST BX W LOC DEV 1ST LESION IMAGE BX SPEC STEREO GUIDE 01/22/2023 [facility]   BREAST BIOPSY Right 01/22/2023   MM RT BREAST BX W LOC DEV EA AD LESION IMG BX SPEC STEREO GUIDE 01/22/2023 [facility]   BREAST BIOPSY Left 01/22/2023   MM LT BREAST BX W LOC DEV 1ST LESION IMAGE BX SPEC STEREO GUIDE 01/22/2023 [facility]   BREAST EXCISIONAL BIOPSY Left    CHOLECYSTECTOMY  2015   HEMORRHOIDECTOMY WITH HEMORRHOID BANDING  04/22/2017   SIMPLE MASTECTOMY WITH AXILLARY SENTINEL NODE BIOPSY Bilateral 03/05/2023   Procedure: BILATERAL  MASTECTOMIES WITH BILATERAL MAGTRACE INJECTIONS;  Surgeon: Almond Lint, MD;  Location: [facility];  Service: General;  Laterality: Bilateral;  PEC BLOCK   Patient Active Problem List   Diagnosis Date Noted   Bilateral breast cancer (HCC) 03/05/2023   Family history of prostate cancer    Genetic testing 02/07/2023   Ductal carcinoma in situ (DCIS) of right breast 01/21/2023   Malignant neoplasm of central portion of female breast (HCC) 01/14/2023   Trigger finger, right middle finger 09/11/2021   Annual physical exam 10/31/2020   PCP NOTES >>>>>>>>>>>>>>>>>> 08/26/2020   GERD (gastroesophageal reflux disease) 07/26/2020   Hyperlipidemia 10/06/2019   Vitamin D deficiency 06/18/2018   Hypertensive disorder 05/18/2018    PCP: Willow Ora, MD  REFERRING PROVIDER: Almond Lint, MD  REFERRING DIAG:  C50.111,Z17.0 (ICD-10-CM) - Malignant neoplasm of central portion of right breast in female, estrogen receptor positive (HCC) C50.112,Z17.0 (ICD-10-CM) - Malignant neoplasm of central portion of left breast in female, estrogen receptor positive (HCC)  THERAPY DIAG:  Stiffness of right shoulder, not elsewhere classified  Stiffness of left shoulder, not elsewhere classified  Aftercare following surgery for neoplasm  Abnormal posture  ONSET DATE: 03/05/23  Rationale for Evaluation and Treatment: Rehabilitation  SUBJECTIVE:                                                                                                                                                                                           SUBJECTIVE STATEMENT: I've started doing the exercises she gave me last time and they are going pretty good. I hurt most when I try to lift my arm out to the side.   PERTINENT HISTORY: 03/05/23: bilateral mastectomies and R SLNB,on L breast cancer: DCIS, grade 2, greatest dimension: 2.5 mm, ER: 95%, positive, PR: 60%. R breast cancer: DCIS grade 2, greatest dimension 7 mm,  ER: 100%, positive, PR: 85%, positive, SLNB 0/4 on R, high blood pressure  PAIN:  Are you having pain? Yes NPRS scale: 8/10 Pain location: bilateral chest Pain orientation: Bilateral  PAIN TYPE: burning and sharp Pain description: intermittent, sharp, burning, and stabbing  Aggravating factors: moving, mostly just comes and goes though Relieving factors: healing and pain medication  PRECAUTIONS: osteoporosis  RED FLAGS: None   WEIGHT BEARING RESTRICTIONS: No  FALLS:  Has patient fallen in last 6 months? No  LIVING ENVIRONMENT: Lives with: lives with their family - mom, husband, and daughter Lives in: House/apartment Has following equipment at home: None  OCCUPATION: retired  LEISURE: before the surgery exercised in my home a little - walked, did UE and LE exercises, 2x/wk  HAND DOMINANCE: right   PRIOR LEVEL OF FUNCTION: Independent  PATIENT GOALS: to be able to do all the things I did before - to cook, clean house, hand crafts   OBJECTIVE: Note: Objective measures were completed at Evaluation unless otherwise noted.  COGNITION: Overall cognitive status: Within functional limits for tasks assessed   OBSERVATIONS / OTHER ASSESSMENTS: healing mastectomy scars with steri strips intact, gauze over bilateral drain sites due to still draining   POSTURE: forward head, rounded shoulders  UPPER EXTREMITY AROM/PROM:  A/PROM RIGHT   eval   Shoulder extension 45  Shoulder flexion 73  Shoulder abduction 38  Shoulder internal rotation unable  Shoulder external rotation unable    (Blank rows = not tested)  A/PROM LEFT   eval  Shoulder extension 50  Shoulder flexion 98  Shoulder abduction 71  Shoulder internal rotation unable  Shoulder external rotation unable    (Blank rows = not tested)   UPPER EXTREMITY STRENGTH:   LYMPHEDEMA ASSESSMENTS:   SURGERY TYPE/DATE: 03/05/23- Bilateral mastectomies for bilateral breast cancer  NUMBER OF LYMPH NODES REMOVED: R  0/4  CHEMOTHERAPY: none  RADIATION:none  HORMONE TREATMENT: none currently  INFECTIONS: none   LYMPHEDEMA ASSESSMENTS:  LANDMARK RIGHT  eval  At axilla  35.5  15 cm proximal to olecranon process 30.5  10 cm proximal to olecranon process 28.9  Olecranon process 25.5  15 cm proximal to ulnar styloid process 24.5  10 cm proximal to ulnar styloid process 22.1  Just proximal to ulnar styloid process 17  Across hand at thumb web space 18.9  At base of 2nd digit 6.5  (Blank rows = not tested)  LANDMARK LEFT  eval  At axilla  34.5  15 cm proximal to olecranon process 30.5  10 cm proximal to olecranon process 29  Olecranon process 25.2  15 cm proximal to ulnar styloid process 24.5  10 cm proximal to ulnar styloid process 22.2  Just proximal to ulnar styloid process 17  Across hand at thumb web space 18.5  At base of 2nd digit 6.2  (Blank rows = not tested)     QUICK DASH SURVEY:       TODAY'S TREATMENT:                                                                                                                                          DATE:  04/08/23: Manual Therapy P/ROM of bil shoulders into flex, abd, er and IR to pts tolerance and scapular depression by therapist throughout stretches, min-mod VC's to relax during due to muscle guarding, but pt was able to do this well MFR gently to bil chest walls during P/ROM, gently to Rt axilla and then horizontally across chest with bil hands Removed gauze pt was still using over drain sites. These are healing well so educated pt that she doesn't need this here any more, she verbalized understanding Therapeutic Exercises Piulleys into flex x 3 mins returning therapist demo and tactile and VC's to decrease scapular compensation. Pt was able to demonstrate good understanding of this.   04/01/23: educated pt in post op breast exercises but educated her to hold off for 4 more days due to recent drain removal    PATIENT  EDUCATION:  Education details: lymphedema, post op breast exercises Person educated: Patient Education method: Chief Technology Officer Education comprehension: verbalized understanding  HOME EXERCISE PROGRAM: Post op breast exercises beginning 1 week after last drain removed  ASSESSMENT:  CLINICAL IMPRESSION: Began P/ROM of bil shoulders today with gentle MFR. Pt tolerated this well overall and reported feeling looser after session. Also did well with pulleys. Encouraged her to cont with HEP issued at last session and begin sprinkling in A/ROM stretches with her ADLs, like reaching ito a low shelf and practice decreasing scapular compensation while doing.    OBJECTIVE IMPAIRMENTS: decreased balance, decreased knowledge of condition, decreased knowledge of use of DME, decreased ROM, decreased strength, increased fascial restrictions, impaired UE functional use, postural dysfunction, and pain.   ACTIVITY LIMITATIONS: carrying, lifting, stairs, bathing, dressing, and reach over head  PARTICIPATION LIMITATIONS: meal prep,  cleaning, laundry, community activity, and yard work  PERSONAL FACTORS:  none  are also affecting patient's functional outcome.   REHAB POTENTIAL: Good  CLINICAL DECISION MAKING: Stable/uncomplicated  EVALUATION COMPLEXITY: Low  GOALS: Goals reviewed with patient? Yes  SHORT TERM GOALS=LONG TERM GOALS Target date: 04/29/23  Pt will demonstrate 160 degrees of bilateral shoulder flexion to allow her to reach overhead.  Baseline: R 73, L 98 Goal status: INITIAL  2.  Pt will demonstrate 160 degrees of bilateral shoulder abduction to allow her to reach out to the side. Baseline: R 38, L 71 Goal status: INITIAL  3.  Pt will report no pain across bilateral upper quadrants to allow improved comfort.  Baseline:  Goal status: INITIAL  4.  Pt will report no difficulty ascending/descending steps as a result of balance to decrease fall risk.  Baseline:  Goal status:  INITIAL  5.  Pt will be independent in a home exercise program for long term stretching and strengthening.  Baseline:  Goal status: INITIAL  6.  Pt will be able to verbalize lymphedema risk reduction practices to decrease risk of lymphedema.  Baseline:  Goal status: INITIAL   PLAN:  PT FREQUENCY: 2x/week  PT DURATION: 4 weeks  PLANNED INTERVENTIONS: 97164- PT Re-evaluation, 97110-Therapeutic exercises, 97530- Therapeutic activity, 97112- Neuromuscular re-education, 97535- Self Care, 16109- Manual therapy, [id number]- Orthotic Fit/training, Patient/Family education, Balance training, Joint mobilization, Manual lymph drainage, Scar mobilization, Therapeutic exercises, Therapeutic activity, Neuromuscular re-education, Gait training, and Self Care  PLAN FOR NEXT SESSION: Cont gentle PROM to bilateral shoulders, AAROM exercises, educate in lymphedema risk reduction practices and issue spanish handouts about lymphedema and risk reduction (same handouts as ABC class - they are up front in bin), eventually work on high level balance  Hermenia Bers, PTA 04/08/2023, 10:05 AM

## 2023-04-11 ENCOUNTER — Other Ambulatory Visit (HOSPITAL_BASED_OUTPATIENT_CLINIC_OR_DEPARTMENT_OTHER): Payer: Self-pay

## 2023-04-11 MED ORDER — GABAPENTIN 100 MG PO CAPS
100.0000 mg | ORAL_CAPSULE | Freq: Two times a day (BID) | ORAL | 3 refills | Status: DC
  Filled 2023-04-11: qty 30, 15d supply, fill #0
  Filled 2023-05-02: qty 30, 15d supply, fill #1
  Filled 2023-05-14: qty 30, 15d supply, fill #2

## 2023-04-14 ENCOUNTER — Encounter: Payer: Self-pay | Admitting: Rehabilitation

## 2023-04-14 ENCOUNTER — Ambulatory Visit: Payer: Medicare HMO | Admitting: Rehabilitation

## 2023-04-14 ENCOUNTER — Other Ambulatory Visit (HOSPITAL_BASED_OUTPATIENT_CLINIC_OR_DEPARTMENT_OTHER): Payer: Self-pay

## 2023-04-14 DIAGNOSIS — R293 Abnormal posture: Secondary | ICD-10-CM

## 2023-04-14 DIAGNOSIS — Z170 Estrogen receptor positive status [ER+]: Secondary | ICD-10-CM | POA: Diagnosis not present

## 2023-04-14 DIAGNOSIS — C50111 Malignant neoplasm of central portion of right female breast: Secondary | ICD-10-CM | POA: Diagnosis not present

## 2023-04-14 DIAGNOSIS — Z483 Aftercare following surgery for neoplasm: Secondary | ICD-10-CM | POA: Diagnosis not present

## 2023-04-14 DIAGNOSIS — M25612 Stiffness of left shoulder, not elsewhere classified: Secondary | ICD-10-CM

## 2023-04-14 DIAGNOSIS — C50112 Malignant neoplasm of central portion of left female breast: Secondary | ICD-10-CM | POA: Diagnosis not present

## 2023-04-14 DIAGNOSIS — M25611 Stiffness of right shoulder, not elsewhere classified: Secondary | ICD-10-CM | POA: Diagnosis not present

## 2023-04-14 NOTE — Therapy (Signed)
OUTPATIENT PHYSICAL THERAPY  UPPER EXTREMITY ONCOLOGY TREATMENT  Patient Name: Lauren Hodge MRN: [id number] DOB:Jul 25, 1955, 67 y.o., female Today's Date: 04/14/2023  END OF SESSION:  PT End of Session - 04/14/23 0754     Visit Number 3    Number of Visits 9    Date for PT Re-Evaluation 04/29/23    PT Start Time 0800    PT Stop Time 0848    PT Time Calculation (min) 48 min    Activity Tolerance Patient tolerated treatment well    Behavior During Therapy WFL for tasks assessed/performed             Past Medical History:  Diagnosis Date   Breast mass    Carpal tunnel syndrome    Family history of prostate cancer    Fracture of carpal bone 2018   bilateral   GERD (gastroesophageal reflux disease)    Hemorrhoids    Hiatal hernia 02/2019   seen on EGD    History of left breast biopsy    Benign   HTN (hypertension)    Hyperlipidemia    Insomnia    Osteoporosis    Osteoporosis    Vitamin D deficiency    Past Surgical History:  Procedure Laterality Date   BIOPSY THYROID     BREAST BIOPSY Left 2010   x2, 2010-2012 (no cancer)   BREAST BIOPSY Right 01/10/2023   [place] RT BREAST BX W LOC DEV 1ST LESION IMG BX SPEC US GUIDE 01/10/2023 [facility]   BREAST BIOPSY Right 01/10/2023   MM RT BREAST BX W LOC DEV 1ST LESION IMAGE BX SPEC STEREO GUIDE 01/10/2023 [facility]   BREAST BIOPSY Right 01/22/2023   MM RT BREAST BX W LOC DEV 1ST LESION IMAGE BX SPEC STEREO GUIDE 01/22/2023 [facility]   BREAST BIOPSY Right 01/22/2023   MM RT BREAST BX W LOC DEV EA AD LESION IMG BX SPEC STEREO GUIDE 01/22/2023 [facility]   BREAST BIOPSY Left 01/22/2023   MM LT BREAST BX W LOC DEV 1ST LESION IMAGE BX SPEC STEREO GUIDE 01/22/2023 [facility]   BREAST EXCISIONAL BIOPSY Left    CHOLECYSTECTOMY  2015   HEMORRHOIDECTOMY WITH HEMORRHOID BANDING  04/22/2017   SIMPLE MASTECTOMY WITH AXILLARY SENTINEL NODE BIOPSY Bilateral 03/05/2023   Procedure: BILATERAL  MASTECTOMIES WITH BILATERAL MAGTRACE INJECTIONS;  Surgeon: Almond Lint, MD;  Location: [facility];  Service: General;  Laterality: Bilateral;  PEC BLOCK   Patient Active Problem List   Diagnosis Date Noted   Bilateral breast cancer (HCC) 03/05/2023   Family history of prostate cancer    Genetic testing 02/07/2023   Ductal carcinoma in situ (DCIS) of right breast 01/21/2023   Malignant neoplasm of central portion of female breast (HCC) 01/14/2023   Trigger finger, right middle finger 09/11/2021   Annual physical exam 10/31/2020   PCP NOTES >>>>>>>>>>>>>>>>>> 08/26/2020   GERD (gastroesophageal reflux disease) 07/26/2020   Hyperlipidemia 10/06/2019   Vitamin D deficiency 06/18/2018   Hypertensive disorder 05/18/2018    PCP: Willow Ora, MD  REFERRING PROVIDER: Almond Lint, MD  REFERRING DIAG:  C50.111,Z17.0 (ICD-10-CM) - Malignant neoplasm of central portion of right breast in female, estrogen receptor positive (HCC) C50.112,Z17.0 (ICD-10-CM) - Malignant neoplasm of central portion of left breast in female, estrogen receptor positive (HCC)  THERAPY DIAG:  Stiffness of right shoulder, not elsewhere classified  Stiffness of left shoulder, not elsewhere classified  Aftercare following surgery for neoplasm  Abnormal posture  Malignant neoplasm of central  portion of right breast in female, estrogen receptor positive (HCC)  Malignant neoplasm of central portion of left breast in female, estrogen receptor positive (HCC)  ONSET DATE: 03/05/23  Rationale for Evaluation and Treatment: Rehabilitation  SUBJECTIVE:                                                                                                                                                                                           SUBJECTIVE STATEMENT: I am feeling better.   PERTINENT HISTORY: 03/05/23: bilateral mastectomies and R SLNB,on L breast cancer: DCIS, grade 2, greatest dimension: 2.5 mm, ER:  95%, positive, PR: 60%. R breast cancer: DCIS grade 2, greatest dimension 7 mm, ER: 100%, positive, PR: 85%, positive, SLNB 0/4 on R, high blood pressure  PAIN:  Are you having pain? Yes NPRS scale: 3/10 Pain location: on the Rt side  Pain orientation: axilla  PAIN TYPE: burning and sharp Pain description: intermittent, sharp, burning, and stabbing  Aggravating factors: moving, mostly just comes and goes though Relieving factors: healing and pain medication  PRECAUTIONS: osteoporosis  RED FLAGS: None   WEIGHT BEARING RESTRICTIONS: No  FALLS:  Has patient fallen in last 6 months? No  LIVING ENVIRONMENT: Lives with: lives with their family - mom, husband, and daughter Lives in: House/apartment Has following equipment at home: None  OCCUPATION: retired  LEISURE: before the surgery exercised in my home a little - walked, did UE and LE exercises, 2x/wk  HAND DOMINANCE: right   PRIOR LEVEL OF FUNCTION: Independent  PATIENT GOALS: to be able to do all the things I did before - to cook, clean house, hand crafts   OBJECTIVE: Note: Objective measures were completed at Evaluation unless otherwise noted.  COGNITION: Overall cognitive status: Within functional limits for tasks assessed   OBSERVATIONS / OTHER ASSESSMENTS: healing mastectomy scars with steri strips intact, gauze over bilateral drain sites due to still draining   POSTURE: forward head, rounded shoulders  UPPER EXTREMITY AROM/PROM:  A/PROM RIGHT   eval  04/14/23  Shoulder extension 45 45  Shoulder flexion 73 137  Shoulder abduction 38 100  Shoulder internal rotation unable   Shoulder external rotation unable 80    (Blank rows = not tested)  A/PROM LEFT   eval 04/14/23  Shoulder extension 50 45  Shoulder flexion 98 135  Shoulder abduction 71 125  Shoulder internal rotation unable   Shoulder external rotation unable 80    (Blank rows = not tested)   UPPER EXTREMITY STRENGTH:   LYMPHEDEMA  ASSESSMENTS:  SURGERY TYPE/DATE: 03/05/23- Bilateral mastectomies for bilateral breast cancer NUMBER OF LYMPH NODES REMOVED: R 0/4 CHEMOTHERAPY:  none RADIATION:none HORMONE TREATMENT: none currently INFECTIONS: none  LYMPHEDEMA ASSESSMENTS:   LANDMARK RIGHT  eval  At axilla  35.5  15 cm proximal to olecranon process 30.5  10 cm proximal to olecranon process 28.9  Olecranon process 25.5  15 cm proximal to ulnar styloid process 24.5  10 cm proximal to ulnar styloid process 22.1  Just proximal to ulnar styloid process 17  Across hand at thumb web space 18.9  At base of 2nd digit 6.5  (Blank rows = not tested)  LANDMARK LEFT  eval  At axilla  34.5  15 cm proximal to olecranon process 30.5  10 cm proximal to olecranon process 29  Olecranon process 25.2  15 cm proximal to ulnar styloid process 24.5  10 cm proximal to ulnar styloid process 22.2  Just proximal to ulnar styloid process 17  Across hand at thumb web space 18.5  At base of 2nd digit 6.2  (Blank rows = not tested)   TODAY'S TREATMENT:                                                                                                                                          DATE:  04/14/23 Manual Therapy P/ROM of bil shoulders into flex, abd, er and IR to pts tolerance and scapular depression by therapist throughout stretches, min-mod VC's to relax during due to muscle guarding Pt teary when getting shirt off about not liking to look at herself - so we discussed starting to put lotion on the chest (not incision), starting to clean in the shower and slowly starting to touch and look and that it will get harder the longer you wait.  Therapeutic Exercises Piulleys into flex and abduction x 3 mins each returning therapist demo and tactile and VC's to decrease scapular compensation. Pt was able to demonstrate good understanding of this.  Ball wall into flexion x 5    04/08/23: Manual Therapy P/ROM of bil shoulders into  flex, abd, er and IR to pts tolerance and scapular depression by therapist throughout stretches, min-mod VC's to relax during due to muscle guarding, but pt was able to do this well MFR gently to bil chest walls during P/ROM, gently to Rt axilla and then horizontally across chest with bil hands Removed gauze pt was still using over drain sites. These are healing well so educated pt that she doesn't need this here any more, she verbalized understanding Therapeutic Exercises Piulleys into flex x 3 mins returning therapist demo and tactile and VC's to decrease scapular compensation. Pt was able to demonstrate good understanding of this.   04/01/23: educated pt in post op breast exercises but educated her to hold off for 4 more days due to recent drain removal   PATIENT EDUCATION:  Education details: lymphedema, post op breast exercises Person educated: Patient Education method: Explanation and Handouts Education comprehension: verbalized understanding  HOME EXERCISE  PROGRAM: Post op breast exercises beginning 1 week after last drain removed  ASSESSMENT:  CLINICAL IMPRESSION: Pt is doing better already.  AROM is improving overall and less pain.    OBJECTIVE IMPAIRMENTS: decreased balance, decreased knowledge of condition, decreased knowledge of use of DME, decreased ROM, decreased strength, increased fascial restrictions, impaired UE functional use, postural dysfunction, and pain.   ACTIVITY LIMITATIONS: carrying, lifting, stairs, bathing, dressing, and reach over head  PARTICIPATION LIMITATIONS: meal prep, cleaning, laundry, community activity, and yard work  PERSONAL FACTORS:  none  are also affecting patient's functional outcome.   REHAB POTENTIAL: Good  CLINICAL DECISION MAKING: Stable/uncomplicated  EVALUATION COMPLEXITY: Low  GOALS: Goals reviewed with patient? Yes  SHORT TERM GOALS=LONG TERM GOALS Target date: 04/29/23  Pt will demonstrate 160 degrees of bilateral shoulder  flexion to allow her to reach overhead.  Baseline: R 73, L 98 Goal status: INITIAL  2.  Pt will demonstrate 160 degrees of bilateral shoulder abduction to allow her to reach out to the side. Baseline: R 38, L 71 Goal status: INITIAL  3.  Pt will report no pain across bilateral upper quadrants to allow improved comfort.  Baseline:  Goal status: INITIAL  4.  Pt will report no difficulty ascending/descending steps as a result of balance to decrease fall risk.  Baseline:  Goal status: INITIAL  5.  Pt will be independent in a home exercise program for long term stretching and strengthening.  Baseline:  Goal status: INITIAL  6.  Pt will be able to verbalize lymphedema risk reduction practices to decrease risk of lymphedema.  Baseline:  Goal status: INITIAL   PLAN:  PT FREQUENCY: 2x/week  PT DURATION: 4 weeks  PLANNED INTERVENTIONS: 97164- PT Re-evaluation, 97110-Therapeutic exercises, 97530- Therapeutic activity, 97112- Neuromuscular re-education, 97535- Self Care, 14782- Manual therapy, [phone]- Orthotic Fit/training, Patient/Family education, Balance training, Joint mobilization, Manual lymph drainage, Scar mobilization, Therapeutic exercises, Therapeutic activity, Neuromuscular re-education, Gait training, and Self Care  PLAN FOR NEXT SESSION: Cont gentle PROM to bilateral shoulders, AAROM exercises, educate in lymphedema risk reduction practices and issue spanish handouts about lymphedema and risk reduction (same handouts as ABC class - they are up front in bin), eventually work on high level balance  Usiel Astarita, Julieanne Manson, PT 04/14/2023, 8:49 AM

## 2023-04-15 ENCOUNTER — Other Ambulatory Visit (HOSPITAL_BASED_OUTPATIENT_CLINIC_OR_DEPARTMENT_OTHER): Payer: Self-pay

## 2023-04-17 ENCOUNTER — Ambulatory Visit: Payer: Medicare HMO | Admitting: Rehabilitation

## 2023-04-17 ENCOUNTER — Encounter: Payer: Self-pay | Admitting: Rehabilitation

## 2023-04-17 DIAGNOSIS — C50112 Malignant neoplasm of central portion of left female breast: Secondary | ICD-10-CM | POA: Diagnosis not present

## 2023-04-17 DIAGNOSIS — Z483 Aftercare following surgery for neoplasm: Secondary | ICD-10-CM

## 2023-04-17 DIAGNOSIS — M25612 Stiffness of left shoulder, not elsewhere classified: Secondary | ICD-10-CM | POA: Diagnosis not present

## 2023-04-17 DIAGNOSIS — Z170 Estrogen receptor positive status [ER+]: Secondary | ICD-10-CM | POA: Diagnosis not present

## 2023-04-17 DIAGNOSIS — M25611 Stiffness of right shoulder, not elsewhere classified: Secondary | ICD-10-CM

## 2023-04-17 DIAGNOSIS — C50111 Malignant neoplasm of central portion of right female breast: Secondary | ICD-10-CM | POA: Diagnosis not present

## 2023-04-17 DIAGNOSIS — R293 Abnormal posture: Secondary | ICD-10-CM | POA: Diagnosis not present

## 2023-04-17 NOTE — Therapy (Signed)
OUTPATIENT PHYSICAL THERAPY  UPPER EXTREMITY ONCOLOGY TREATMENT  Patient Name: Lauren Hodge MRN: [id number] DOB:[birth year], 67 y.o., female Today's Date: 04/17/2023  END OF SESSION:  PT End of Session - 04/17/23 0932     Visit Number 4    Number of Visits 9    Date for PT Re-Evaluation 04/29/23    PT Start Time 0954    PT Stop Time 1040    PT Time Calculation (min) 46 min    Activity Tolerance Patient tolerated treatment well    Behavior During Therapy WFL for tasks assessed/performed             Past Medical History:  Diagnosis Date   Breast mass    Carpal tunnel syndrome    Family history of prostate cancer    Fracture of carpal bone 2018   bilateral   GERD (gastroesophageal reflux disease)    Hemorrhoids    Hiatal hernia 02/2019   seen on EGD    History of left breast biopsy    Benign   HTN (hypertension)    Hyperlipidemia    Insomnia    Osteoporosis    Osteoporosis    Vitamin D deficiency    Past Surgical History:  Procedure Laterality Date   BIOPSY THYROID     BREAST BIOPSY Left 2010   x2, 2010-2012 (no cancer)   BREAST BIOPSY Right 01/10/2023   [place] RT BREAST BX W LOC DEV 1ST LESION IMG BX SPEC US GUIDE 01/10/2023 [facility]   BREAST BIOPSY Right 01/10/2023   MM RT BREAST BX W LOC DEV 1ST LESION IMAGE BX SPEC STEREO GUIDE 01/10/2023 [facility]   BREAST BIOPSY Right 01/22/2023   MM RT BREAST BX W LOC DEV 1ST LESION IMAGE BX SPEC STEREO GUIDE 01/22/2023 [facility]   BREAST BIOPSY Right 01/22/2023   MM RT BREAST BX W LOC DEV EA AD LESION IMG BX SPEC STEREO GUIDE 01/22/2023 [facility]   BREAST BIOPSY Left 01/22/2023   MM LT BREAST BX W LOC DEV 1ST LESION IMAGE BX SPEC STEREO GUIDE 01/22/2023 [facility]   BREAST EXCISIONAL BIOPSY Left    CHOLECYSTECTOMY  2015   HEMORRHOIDECTOMY WITH HEMORRHOID BANDING  04/22/2017   SIMPLE MASTECTOMY WITH AXILLARY SENTINEL NODE BIOPSY Bilateral 03/05/2023   Procedure: BILATERAL  MASTECTOMIES WITH BILATERAL MAGTRACE INJECTIONS;  Surgeon: Almond Lint, MD;  Location: [facility];  Service: General;  Laterality: Bilateral;  PEC BLOCK   Patient Active Problem List   Diagnosis Date Noted   Bilateral breast cancer (HCC) 03/05/2023   Family history of prostate cancer    Genetic testing 02/07/2023   Ductal carcinoma in situ (DCIS) of right breast 01/21/2023   Malignant neoplasm of central portion of female breast (HCC) 01/14/2023   Trigger finger, right middle finger 09/11/2021   Annual physical exam 10/31/2020   PCP NOTES >>>>>>>>>>>>>>>>>> 08/26/2020   GERD (gastroesophageal reflux disease) 07/26/2020   Hyperlipidemia 10/06/2019   Vitamin D deficiency 06/18/2018   Hypertensive disorder 05/18/2018    PCP: Willow Ora, MD  REFERRING PROVIDER: Almond Lint, MD  REFERRING DIAG:  C50.111,Z17.0 (ICD-10-CM) - Malignant neoplasm of central portion of right breast in female, estrogen receptor positive (HCC) C50.112,Z17.0 (ICD-10-CM) - Malignant neoplasm of central portion of left breast in female, estrogen receptor positive (HCC)  THERAPY DIAG:  Stiffness of right shoulder, not elsewhere classified  Stiffness of left shoulder, not elsewhere classified  Aftercare following surgery for neoplasm  Abnormal posture  Malignant neoplasm of central  portion of right breast in female, estrogen receptor positive (HCC)  Malignant neoplasm of central portion of left breast in female, estrogen receptor positive (HCC)  ONSET DATE: 03/05/23  Rationale for Evaluation and Treatment: Rehabilitation  SUBJECTIVE:                                                                                                                                                                                           SUBJECTIVE STATEMENT: I just seasoned food - no cooking.  But I am feeling good   PERTINENT HISTORY: 03/05/23: bilateral mastectomies and R SLNB,on L breast cancer: DCIS,  grade 2, greatest dimension: 2.5 mm, ER: 95%, positive, PR: 60%. R breast cancer: DCIS grade 2, greatest dimension 7 mm, ER: 100%, positive, PR: 85%, positive, SLNB 0/4 on R, high blood pressure  PAIN:  Are you having pain? Yes NPRS scale: 2/10 Pain location: on the Rt side  Pain orientation: axilla  PAIN TYPE: burning and sharp Pain description: intermittent, sharp, burning, and stabbing  Aggravating factors: moving, mostly just comes and goes though Relieving factors: healing and pain medication  PRECAUTIONS: osteoporosis  RED FLAGS: None   WEIGHT BEARING RESTRICTIONS: No  FALLS:  Has patient fallen in last 6 months? No  LIVING ENVIRONMENT: Lives with: lives with their family - mom, husband, and daughter Lives in: House/apartment Has following equipment at home: None  OCCUPATION: retired  LEISURE: before the surgery exercised in my home a little - walked, did UE and LE exercises, 2x/wk  HAND DOMINANCE: right   PRIOR LEVEL OF FUNCTION: Independent  PATIENT GOALS: to be able to do all the things I did before - to cook, clean house, hand crafts   OBJECTIVE: Note: Objective measures were completed at Evaluation unless otherwise noted.  COGNITION: Overall cognitive status: Within functional limits for tasks assessed   OBSERVATIONS / OTHER ASSESSMENTS: healing mastectomy scars with steri strips intact, gauze over bilateral drain sites due to still draining   POSTURE: forward head, rounded shoulders  UPPER EXTREMITY AROM/PROM:  A/PROM RIGHT   eval  04/14/23  Shoulder extension 45 45  Shoulder flexion 73 137  Shoulder abduction 38 100  Shoulder internal rotation unable   Shoulder external rotation unable 80    (Blank rows = not tested)  A/PROM LEFT   eval 04/14/23  Shoulder extension 50 45  Shoulder flexion 98 135  Shoulder abduction 71 125  Shoulder internal rotation unable   Shoulder external rotation unable 80    (Blank rows = not tested)   UPPER  EXTREMITY STRENGTH:   LYMPHEDEMA ASSESSMENTS:  SURGERY TYPE/DATE: 03/05/23- Bilateral mastectomies for bilateral breast  cancer NUMBER OF LYMPH NODES REMOVED: R 0/4 CHEMOTHERAPY: none RADIATION:none HORMONE TREATMENT: none currently INFECTIONS: none  LYMPHEDEMA ASSESSMENTS:   LANDMARK RIGHT  eval  At axilla  35.5  15 cm proximal to olecranon process 30.5  10 cm proximal to olecranon process 28.9  Olecranon process 25.5  15 cm proximal to ulnar styloid process 24.5  10 cm proximal to ulnar styloid process 22.1  Just proximal to ulnar styloid process 17  Across hand at thumb web space 18.9  At base of 2nd digit 6.5  (Blank rows = not tested)  LANDMARK LEFT  eval  At axilla  34.5  15 cm proximal to olecranon process 30.5  10 cm proximal to olecranon process 29  Olecranon process 25.2  15 cm proximal to ulnar styloid process 24.5  10 cm proximal to ulnar styloid process 22.2  Just proximal to ulnar styloid process 17  Across hand at thumb web space 18.5  At base of 2nd digit 6.2  (Blank rows = not tested)   TODAY'S TREATMENT:                                                                                                                                          DATE:  04/17/23 Manual Therapy P/ROM of bil shoulders into flex, abd, er and IR to pts tolerance - no vcs needed to relax today.  Included some gentle MLD like therapeutic touch to upper arm, shoulder, anterior chest to decrease fear of touch and for desensitization - tolerated well.  Therapeutic Exercises Pulleys into flex and abduction x 3 mins each  Ball wall into flexion x 5  Supine dowel flexion x 5 Supine alternating flexion x 5bil Chest stretch behind the head AROM x 5 Discussed removal of steristrips and demonstrated to pt on myself with paper tape how you can pull them off if there is no resistance and to leave it be if there is resistance to removal.   04/14/23 Manual Therapy P/ROM of bil shoulders  into flex, abd, er and IR to pts tolerance and scapular depression by therapist throughout stretches, min-mod VC's to relax during due to muscle guarding Pt teary when getting shirt off about not liking to look at herself - so we discussed starting to put lotion on the chest (not incision), starting to clean in the shower and slowly starting to touch and look and that it will get harder the longer you wait.  Therapeutic Exercises Piulleys into flex and abduction x 3 mins each returning therapist demo and tactile and VC's to decrease scapular compensation. Pt was able to demonstrate good understanding of this.  Ball wall into flexion x 5    04/08/23: Manual Therapy P/ROM of bil shoulders into flex, abd, er and IR to pts tolerance and scapular depression by therapist throughout stretches, min-mod VC's to relax during due to muscle guarding, but pt was  able to do this well MFR gently to bil chest walls during P/ROM, gently to Rt axilla and then horizontally across chest with bil hands Removed gauze pt was still using over drain sites. These are healing well so educated pt that she doesn't need this here any more, she verbalized understanding Therapeutic Exercises Piulleys into flex x 3 mins returning therapist demo and tactile and VC's to decrease scapular compensation. Pt was able to demonstrate good understanding of this.   04/01/23: educated pt in post op breast exercises but educated her to hold off for 4 more days due to recent drain removal   PATIENT EDUCATION:  Education details: lymphedema, post op breast exercises Person educated: Patient Education method: Chief Technology Officer Education comprehension: verbalized understanding  HOME EXERCISE PROGRAM: Post op breast exercises beginning 1 week after last drain removed  ASSESSMENT:  CLINICAL IMPRESSION: Pt is doing better already.  AROM is improving overall and less pain.  She is still fearful of touching and looking at herself so  she would benefit from continued work with this aspect.   OBJECTIVE IMPAIRMENTS: decreased balance, decreased knowledge of condition, decreased knowledge of use of DME, decreased ROM, decreased strength, increased fascial restrictions, impaired UE functional use, postural dysfunction, and pain.   ACTIVITY LIMITATIONS: carrying, lifting, stairs, bathing, dressing, and reach over head  PARTICIPATION LIMITATIONS: meal prep, cleaning, laundry, community activity, and yard work  PERSONAL FACTORS:  none  are also affecting patient's functional outcome.   REHAB POTENTIAL: Good  CLINICAL DECISION MAKING: Stable/uncomplicated  EVALUATION COMPLEXITY: Low  GOALS: Goals reviewed with patient? Yes  SHORT TERM GOALS=LONG TERM GOALS Target date: 04/29/23  Pt will demonstrate 160 degrees of bilateral shoulder flexion to allow her to reach overhead.  Baseline: R 73, L 98 Goal status: INITIAL  2.  Pt will demonstrate 160 degrees of bilateral shoulder abduction to allow her to reach out to the side. Baseline: R 38, L 71 Goal status: INITIAL  3.  Pt will report no pain across bilateral upper quadrants to allow improved comfort.  Baseline:  Goal status: INITIAL  4.  Pt will report no difficulty ascending/descending steps as a result of balance to decrease fall risk.  Baseline:  Goal status: INITIAL  5.  Pt will be independent in a home exercise program for long term stretching and strengthening.  Baseline:  Goal status: INITIAL  6.  Pt will be able to verbalize lymphedema risk reduction practices to decrease risk of lymphedema.  Baseline:  Goal status: INITIAL   PLAN:  PT FREQUENCY: 2x/week  PT DURATION: 4 weeks  PLANNED INTERVENTIONS: 97164- PT Re-evaluation, 97110-Therapeutic exercises, 97530- Therapeutic activity, 97112- Neuromuscular re-education, 97535- Self Care, 16109- Manual therapy, [phone]- Orthotic Fit/training, Patient/Family education, Balance training, Joint mobilization,  Manual lymph drainage, Scar mobilization, Therapeutic exercises, Therapeutic activity, Neuromuscular re-education, Gait training, and Self Care  PLAN FOR NEXT SESSION: Cont gentle PROM to bilateral shoulders, AAROM exercises, educate in lymphedema risk reduction practices and issue spanish handouts about lymphedema and risk reduction (same handouts as ABC class - they are up front in bin), eventually work on high level balance  Kamali Sakata, Julieanne Manson, PT 04/17/2023, 10:41 AM

## 2023-04-21 ENCOUNTER — Ambulatory Visit: Payer: Medicare HMO | Admitting: Rehabilitation

## 2023-04-29 ENCOUNTER — Ambulatory Visit: Payer: Medicare HMO | Attending: General Surgery

## 2023-04-29 DIAGNOSIS — M25611 Stiffness of right shoulder, not elsewhere classified: Secondary | ICD-10-CM

## 2023-04-29 DIAGNOSIS — R293 Abnormal posture: Secondary | ICD-10-CM | POA: Diagnosis not present

## 2023-04-29 DIAGNOSIS — M25612 Stiffness of left shoulder, not elsewhere classified: Secondary | ICD-10-CM | POA: Diagnosis not present

## 2023-04-29 DIAGNOSIS — C50111 Malignant neoplasm of central portion of right female breast: Secondary | ICD-10-CM

## 2023-04-29 DIAGNOSIS — Z483 Aftercare following surgery for neoplasm: Secondary | ICD-10-CM

## 2023-04-29 DIAGNOSIS — C50112 Malignant neoplasm of central portion of left female breast: Secondary | ICD-10-CM

## 2023-04-29 DIAGNOSIS — Z170 Estrogen receptor positive status [ER+]: Secondary | ICD-10-CM | POA: Insufficient documentation

## 2023-04-29 NOTE — Therapy (Signed)
 OUTPATIENT PHYSICAL THERAPY  UPPER EXTREMITY ONCOLOGY TREATMENT  Patient Name: Lauren Hodge MRN: [id number] DOB:17-Dec-1955, 68 y.o., female Today's Date: 04/29/2023  END OF SESSION:  PT End of Session - 04/29/23 0912     Visit Number 5    Number of Visits 9    Date for PT Re-Evaluation 04/29/23    PT Start Time 0904    PT Stop Time 1000    PT Time Calculation (min) 56 min    Activity Tolerance Patient tolerated treatment well    Behavior During Therapy WFL for tasks assessed/performed             Past Medical History:  Diagnosis Date   Breast mass    Carpal tunnel syndrome    Family history of prostate cancer    Fracture of carpal bone 2018   bilateral   GERD (gastroesophageal reflux disease)    Hemorrhoids    Hiatal hernia 02/2019   seen on EGD    History of left breast biopsy    Benign   HTN (hypertension)    Hyperlipidemia    Insomnia    Osteoporosis    Osteoporosis    Vitamin D  deficiency    Past Surgical History:  Procedure Laterality Date   BIOPSY THYROID      BREAST BIOPSY Left 2010   x2, 2010-2012 (no cancer)   BREAST BIOPSY Right 01/10/2023   US  RT BREAST BX W LOC DEV 1ST LESION IMG BX SPEC US  GUIDE 01/10/2023 [facility]   BREAST BIOPSY Right 01/10/2023   MM RT BREAST BX W LOC DEV 1ST LESION IMAGE BX SPEC STEREO GUIDE 01/10/2023 [facility]   BREAST BIOPSY Right 01/22/2023   MM RT BREAST BX W LOC DEV 1ST LESION IMAGE BX SPEC STEREO GUIDE 01/22/2023 [facility]   BREAST BIOPSY Right 01/22/2023   MM RT BREAST BX W LOC DEV EA AD LESION IMG BX SPEC STEREO GUIDE 01/22/2023 [facility]   BREAST BIOPSY Left 01/22/2023   MM LT BREAST BX W LOC DEV 1ST LESION IMAGE BX SPEC STEREO GUIDE 01/22/2023 [facility]   BREAST EXCISIONAL BIOPSY Left    CHOLECYSTECTOMY  2015   HEMORRHOIDECTOMY WITH HEMORRHOID BANDING  04/22/2017   SIMPLE MASTECTOMY WITH AXILLARY SENTINEL NODE BIOPSY Bilateral 03/05/2023   Procedure: BILATERAL  MASTECTOMIES WITH BILATERAL MAGTRACE INJECTIONS;  Surgeon: Aron Shoulders, MD;  Location: [facility];  Service: General;  Laterality: Bilateral;  PEC BLOCK   Patient Active Problem List   Diagnosis Date Noted   Bilateral breast cancer (HCC) 03/05/2023   Family history of prostate cancer    Genetic testing 02/07/2023   Ductal carcinoma in situ (DCIS) of right breast 01/21/2023   Malignant neoplasm of central portion of female breast (HCC) 01/14/2023   Trigger finger, right middle finger 09/11/2021   Annual physical exam 10/31/2020   PCP NOTES >>>>>>>>>>>>>>>>>> 08/26/2020   GERD (gastroesophageal reflux disease) 07/26/2020   Hyperlipidemia 10/06/2019   Vitamin D  deficiency 06/18/2018   Hypertensive disorder 05/18/2018    PCP: Aloysius Mech, MD  REFERRING PROVIDER: Shoulders Aron, MD  REFERRING DIAG:  C50.111,Z17.0 (ICD-10-CM) - Malignant neoplasm of central portion of right breast in female, estrogen receptor positive (HCC) C50.112,Z17.0 (ICD-10-CM) - Malignant neoplasm of central portion of left breast in female, estrogen receptor positive (HCC)  THERAPY DIAG:  Stiffness of right shoulder, not elsewhere classified  Stiffness of left shoulder, not elsewhere classified  Aftercare following surgery for neoplasm  Abnormal posture  Malignant neoplasm of central  portion of right breast in female, estrogen receptor positive (HCC)  Malignant neoplasm of central portion of left breast in female, estrogen receptor positive (HCC)  ONSET DATE: 03/05/23  Rationale for Evaluation and Treatment: Rehabilitation  SUBJECTIVE:                                                                                                                                                                                           SUBJECTIVE STATEMENT: I have noticed some drainage at night since surgery sometimes on my nightgown when I wake up in the morning from the Lt incision where it's still healing.  It has been getting less since surgery though. My motion is improving and I am feeling better overall.   PERTINENT HISTORY: 03/05/23: bilateral mastectomies and R SLNB,on L breast cancer: DCIS, grade 2, greatest dimension: 2.5 mm, ER: 95%, positive, PR: 60%. R breast cancer: DCIS grade 2, greatest dimension 7 mm, ER: 100%, positive, PR: 85%, positive, SLNB 0/4 on R, high blood pressure  PAIN:  Are you having pain?No, not currently, but sometimes Rt shoulder feels really weak and tired  PRECAUTIONS: osteoporosis  RED FLAGS: None   WEIGHT BEARING RESTRICTIONS: No  FALLS:  Has patient fallen in last 6 months? No  LIVING ENVIRONMENT: Lives with: lives with their family - mom, husband, and daughter Lives in: House/apartment Has following equipment at home: None  OCCUPATION: retired  LEISURE: before the surgery exercised in my home a little - walked, did UE and LE exercises, 2x/wk  HAND DOMINANCE: right   PRIOR LEVEL OF FUNCTION: Independent  PATIENT GOALS: to be able to do all the things I did before - to cook, clean house, hand crafts   OBJECTIVE: Note: Objective measures were completed at Evaluation unless otherwise noted.  COGNITION: Overall cognitive status: Within functional limits for tasks assessed   OBSERVATIONS / OTHER ASSESSMENTS: healing mastectomy scars with steri strips intact, gauze over bilateral drain sites due to still draining   POSTURE: forward head, rounded shoulders  UPPER EXTREMITY AROM/PROM:  A/PROM RIGHT   eval  04/14/23  Shoulder extension 45 45  Shoulder flexion 73 137  Shoulder abduction 38 100  Shoulder internal rotation unable   Shoulder external rotation unable 80    (Blank rows = not tested)  A/PROM LEFT   eval 04/14/23  Shoulder extension 50 45  Shoulder flexion 98 135  Shoulder abduction 71 125  Shoulder internal rotation unable   Shoulder external rotation unable 80    (Blank rows = not tested)   UPPER EXTREMITY STRENGTH:    LYMPHEDEMA ASSESSMENTS:  SURGERY TYPE/DATE: 03/05/23- Bilateral mastectomies for bilateral  breast cancer NUMBER OF LYMPH NODES REMOVED: R 0/4 CHEMOTHERAPY: none RADIATION:none HORMONE TREATMENT: none currently INFECTIONS: none  LYMPHEDEMA ASSESSMENTS:   LANDMARK RIGHT  eval  At axilla  35.5  15 cm proximal to olecranon process 30.5  10 cm proximal to olecranon process 28.9  Olecranon process 25.5  15 cm proximal to ulnar styloid process 24.5  10 cm proximal to ulnar styloid process 22.1  Just proximal to ulnar styloid process 17  Across hand at thumb web space 18.9  At base of 2nd digit 6.5  (Blank rows = not tested)  LANDMARK LEFT  eval  At axilla  34.5  15 cm proximal to olecranon process 30.5  10 cm proximal to olecranon process 29  Olecranon process 25.2  15 cm proximal to ulnar styloid process 24.5  10 cm proximal to ulnar styloid process 22.2  Just proximal to ulnar styloid process 17  Across hand at thumb web space 18.5  At base of 2nd digit 6.2  (Blank rows = not tested)   TODAY'S TREATMENT:                                                                                                                                          DATE:  04/29/23: Manual Therapy P/ROM of bil shoulders into flex, abd, and D2 with scapular depression by therapist throughout. Answered pts questions during stretching about drainage she has been seeing on her nightgown. Educated her that since she reports this has been steadily improving/reducing since surgery, and she does not demonstrate any other signs of infection that this is likely normal post op drainage as she still has one small area in the center of her incision that has yet to close and appears to be a small hole. She was educated about s/s of infection to be aware of tough if anything changes. Also educated pt that the bumps she feels at her chest is not a new mass and in fact are her ribs that she hasn't been able to palpate  before due to having breast tissue. Pt is very nervous about cancer returning so spent time encouraging her, within scope of practice, about very small likelihood of this in general, but especially this soon after surgery. Pt verbalized good understanding of all seemed to feel better after discussion with help of interpreter.  Included some gentle MLD to Rt lateral trunk where more post op edema present: Rt inguinal nodes, and Rt axillo-inguinal anastomosis. Therapeutic Exercises Pulleys into flex and abd x 2 mins each  04/17/23 Manual Therapy P/ROM of bil shoulders into flex, abd, er and IR to pts tolerance - no vcs needed to relax today.  Included some gentle MLD like therapeutic touch to upper arm, shoulder, anterior chest to decrease fear of touch and for desensitization - tolerated well.  Therapeutic Exercises Pulleys into flex and abduction x 3 mins each  Mercer  wall into flexion x 5  Supine dowel flexion x 5 Supine alternating flexion x 5bil Chest stretch behind the head AROM x 5 Discussed removal of steristrips and demonstrated to pt on myself with paper tape how you can pull them off if there is no resistance and to leave it be if there is resistance to removal.   04/14/23 Manual Therapy P/ROM of bil shoulders into flex, abd, er and IR to pts tolerance and scapular depression by therapist throughout stretches, min-mod VC's to relax during due to muscle guarding Pt teary when getting shirt off about not liking to look at herself - so we discussed starting to put lotion on the chest (not incision), starting to clean in the shower and slowly starting to touch and look and that it will get harder the longer you wait.  Therapeutic Exercises Piulleys into flex and abduction x 3 mins each returning therapist demo and tactile and VC's to decrease scapular compensation. Pt was able to demonstrate good understanding of this.  Ball wall into flexion x 5    04/08/23: Manual Therapy P/ROM of  bil shoulders into flex, abd, er and IR to pts tolerance and scapular depression by therapist throughout stretches, min-mod VC's to relax during due to muscle guarding, but pt was able to do this well MFR gently to bil chest walls during P/ROM, gently to Rt axilla and then horizontally across chest with bil hands Removed gauze pt was still using over drain sites. These are healing well so educated pt that she doesn't need this here any more, she verbalized understanding Therapeutic Exercises Piulleys into flex x 3 mins returning therapist demo and tactile and VC's to decrease scapular compensation. Pt was able to demonstrate good understanding of this. Started instructing pt in low and slow progression of reps and resistance in response to pts questions about being able to start exercising. Also discussed importance of beginning a daily walking program to aid in her recovery.  04/01/23: educated pt in post op breast exercises but educated her to hold off for 4 more days due to recent drain removal   PATIENT EDUCATION:  Education details: lymphedema, post op breast exercises Person educated: Patient Education method: Chief Technology Officer Education comprehension: verbalized understanding  HOME EXERCISE PROGRAM: Post op breast exercises beginning 1 week after last drain removed  ASSESSMENT:  CLINICAL IMPRESSION: Pt was nervous about drainage that she reports since surgery. However, after asking more questions pt reports this has steadily improved and she does still have small area of opening in center of Lt incision so this is likely normal drainage after mastectomy. See above for full education given to pt. Continued with manual therapy working to decrease bil upper quadrant tightness and improve bil shoulder end motions. Also began educating pt on low and slow progression of exercises with resistance. She will benefit from learning some light resistive exs next.   OBJECTIVE IMPAIRMENTS:  decreased balance, decreased knowledge of condition, decreased knowledge of use of DME, decreased ROM, decreased strength, increased fascial restrictions, impaired UE functional use, postural dysfunction, and pain.   ACTIVITY LIMITATIONS: carrying, lifting, stairs, bathing, dressing, and reach over head  PARTICIPATION LIMITATIONS: meal prep, cleaning, laundry, community activity, and yard work  PERSONAL FACTORS:  none  are also affecting patient's functional outcome.   REHAB POTENTIAL: Good  CLINICAL DECISION MAKING: Stable/uncomplicated  EVALUATION COMPLEXITY: Low  GOALS: Goals reviewed with patient? Yes  SHORT TERM GOALS=LONG TERM GOALS Target date: 04/29/23  Pt will demonstrate 160  degrees of bilateral shoulder flexion to allow her to reach overhead.  Baseline: R 73, L 98 Goal status: INITIAL  2.  Pt will demonstrate 160 degrees of bilateral shoulder abduction to allow her to reach out to the side. Baseline: R 38, L 71 Goal status: INITIAL  3.  Pt will report no pain across bilateral upper quadrants to allow improved comfort.  Baseline:  Goal status: INITIAL  4.  Pt will report no difficulty ascending/descending steps as a result of balance to decrease fall risk.  Baseline:  Goal status: INITIAL  5.  Pt will be independent in a home exercise program for long term stretching and strengthening.  Baseline:  Goal status: INITIAL  6.  Pt will be able to verbalize lymphedema risk reduction practices to decrease risk of lymphedema.  Baseline:  Goal status: INITIAL   PLAN:  PT FREQUENCY: 2x/week  PT DURATION: 4 weeks  PLANNED INTERVENTIONS: [id number]- PT Re-evaluation, 97110-Therapeutic exercises, 97530- Therapeutic activity, 97112- Neuromuscular re-education, 97535- Self Care, [id number]- Manual therapy, [phone]- Orthotic Fit/training, Patient/Family education, Balance training, Joint mobilization, Manual lymph drainage, Scar mobilization, Therapeutic exercises, Therapeutic activity,  Neuromuscular re-education, Gait training, and Self Care  PLAN FOR NEXT SESSION: Instruct in bil UE 3 way raises continuing to review low and slow progression. Has pt started walking? Cont gentle PROM to bilateral shoulders, AAROM exercises, educate in lymphedema risk reduction practices and issue spanish handouts about lymphedema and risk reduction (same handouts as ABC class - they are up front in bin), eventually work on high level balance  Aden Berwyn Caldron, PTA 04/29/2023, 12:28 PM

## 2023-05-01 ENCOUNTER — Other Ambulatory Visit (HOSPITAL_BASED_OUTPATIENT_CLINIC_OR_DEPARTMENT_OTHER): Payer: Self-pay

## 2023-05-01 ENCOUNTER — Ambulatory Visit: Payer: Medicare (Managed Care)

## 2023-05-01 DIAGNOSIS — M25611 Stiffness of right shoulder, not elsewhere classified: Secondary | ICD-10-CM | POA: Diagnosis not present

## 2023-05-01 DIAGNOSIS — Z483 Aftercare following surgery for neoplasm: Secondary | ICD-10-CM

## 2023-05-01 DIAGNOSIS — M25612 Stiffness of left shoulder, not elsewhere classified: Secondary | ICD-10-CM

## 2023-05-01 DIAGNOSIS — R293 Abnormal posture: Secondary | ICD-10-CM

## 2023-05-01 DIAGNOSIS — Z170 Estrogen receptor positive status [ER+]: Secondary | ICD-10-CM

## 2023-05-01 NOTE — Patient Instructions (Addendum)
 Lauren Hodge

## 2023-05-01 NOTE — Therapy (Signed)
 OUTPATIENT PHYSICAL THERAPY  UPPER EXTREMITY ONCOLOGY TREATMENT  Patient Name: Lauren Hodge MRN: [id number] DOB:January 22, 1956, 68 y.o., female Today's Date: 05/01/2023  END OF SESSION:  PT End of Session - 05/01/23 1007     Visit Number 6    Number of Visits 9    Date for PT Re-Evaluation 04/29/23    PT Start Time 1002    PT Stop Time 1059    PT Time Calculation (min) 57 min    Activity Tolerance Patient tolerated treatment well    Behavior During Therapy WFL for tasks assessed/performed             Past Medical History:  Diagnosis Date   Breast mass    Carpal tunnel syndrome    Family history of prostate cancer    Fracture of carpal bone 2018   bilateral   GERD (gastroesophageal reflux disease)    Hemorrhoids    Hiatal hernia 02/2019   seen on EGD    History of left breast biopsy    Benign   HTN (hypertension)    Hyperlipidemia    Insomnia    Osteoporosis    Osteoporosis    Vitamin D  deficiency    Past Surgical History:  Procedure Laterality Date   BIOPSY THYROID      BREAST BIOPSY Left 2010   x2, 2010-2012 (no cancer)   BREAST BIOPSY Right 01/10/2023   US  RT BREAST BX W LOC DEV 1ST LESION IMG BX SPEC US  GUIDE 01/10/2023 [facility]   BREAST BIOPSY Right 01/10/2023   MM RT BREAST BX W LOC DEV 1ST LESION IMAGE BX SPEC STEREO GUIDE 01/10/2023 [facility]   BREAST BIOPSY Right 01/22/2023   MM RT BREAST BX W LOC DEV 1ST LESION IMAGE BX SPEC STEREO GUIDE 01/22/2023 [facility]   BREAST BIOPSY Right 01/22/2023   MM RT BREAST BX W LOC DEV EA AD LESION IMG BX SPEC STEREO GUIDE 01/22/2023 [facility]   BREAST BIOPSY Left 01/22/2023   MM LT BREAST BX W LOC DEV 1ST LESION IMAGE BX SPEC STEREO GUIDE 01/22/2023 [facility]   BREAST EXCISIONAL BIOPSY Left    CHOLECYSTECTOMY  2015   HEMORRHOIDECTOMY WITH HEMORRHOID BANDING  04/22/2017   SIMPLE MASTECTOMY WITH AXILLARY SENTINEL NODE BIOPSY Bilateral 03/05/2023   Procedure: BILATERAL  MASTECTOMIES WITH BILATERAL MAGTRACE INJECTIONS;  Surgeon: Aron Shoulders, MD;  Location: [facility];  Service: General;  Laterality: Bilateral;  PEC BLOCK   Patient Active Problem List   Diagnosis Date Noted   Bilateral breast cancer (HCC) 03/05/2023   Family history of prostate cancer    Genetic testing 02/07/2023   Ductal carcinoma in situ (DCIS) of right breast 01/21/2023   Malignant neoplasm of central portion of female breast (HCC) 01/14/2023   Trigger finger, right middle finger 09/11/2021   Annual physical exam 10/31/2020   PCP NOTES >>>>>>>>>>>>>>>>>> 08/26/2020   GERD (gastroesophageal reflux disease) 07/26/2020   Hyperlipidemia 10/06/2019   Vitamin D  deficiency 06/18/2018   Hypertensive disorder 05/18/2018    PCP: Aloysius Mech, MD  REFERRING PROVIDER: Shoulders Aron, MD  REFERRING DIAG:  C50.111,Z17.0 (ICD-10-CM) - Malignant neoplasm of central portion of right breast in female, estrogen receptor positive (HCC) C50.112,Z17.0 (ICD-10-CM) - Malignant neoplasm of central portion of left breast in female, estrogen receptor positive (HCC)  THERAPY DIAG:  Stiffness of right shoulder, not elsewhere classified  Stiffness of left shoulder, not elsewhere classified  Aftercare following surgery for neoplasm  Abnormal posture  Malignant neoplasm of central  portion of right breast in female, estrogen receptor positive (HCC)  Malignant neoplasm of central portion of left breast in female, estrogen receptor positive (HCC)  ONSET DATE: 03/05/23  Rationale for Evaluation and Treatment: Rehabilitation  SUBJECTIVE:                                                                                                                                                                                           SUBJECTIVE STATEMENT: I felt good after last session. I am still noticing my Rt shoulder still feels weaker than Lt but I think I noticed that before surgery.   PERTINENT  HISTORY: 03/05/23: bilateral mastectomies and R SLNB,on L breast cancer: DCIS, grade 2, greatest dimension: 2.5 mm, ER: 95%, positive, PR: 60%. R breast cancer: DCIS grade 2, greatest dimension 7 mm, ER: 100%, positive, PR: 85%, positive, SLNB 0/4 on R, high blood pressure  PAIN:  Are you having pain? No, not currently, but sometimes Rt shoulder feels really weak and tired  PRECAUTIONS: osteoporosis  RED FLAGS: None   WEIGHT BEARING RESTRICTIONS: No  FALLS:  Has patient fallen in last 6 months? No  LIVING ENVIRONMENT: Lives with: lives with their family - mom, husband, and daughter Lives in: House/apartment Has following equipment at home: None  OCCUPATION: retired  LEISURE: before the surgery exercised in my home a little - walked, did UE and LE exercises, 2x/wk  HAND DOMINANCE: right   PRIOR LEVEL OF FUNCTION: Independent  PATIENT GOALS: to be able to do all the things I did before - to cook, clean house, hand crafts   OBJECTIVE: Note: Objective measures were completed at Evaluation unless otherwise noted.  COGNITION: Overall cognitive status: Within functional limits for tasks assessed   OBSERVATIONS / OTHER ASSESSMENTS: healing mastectomy scars with steri strips intact, gauze over bilateral drain sites due to still draining   POSTURE: forward head, rounded shoulders  UPPER EXTREMITY AROM/PROM:  A/PROM RIGHT   eval  04/14/23  Shoulder extension 45 45  Shoulder flexion 73 137  Shoulder abduction 38 100  Shoulder internal rotation unable   Shoulder external rotation unable 80    (Blank rows = not tested)  A/PROM LEFT   eval 04/14/23  Shoulder extension 50 45  Shoulder flexion 98 135  Shoulder abduction 71 125  Shoulder internal rotation unable   Shoulder external rotation unable 80    (Blank rows = not tested)   UPPER EXTREMITY STRENGTH:   LYMPHEDEMA ASSESSMENTS:  SURGERY TYPE/DATE: 03/05/23- Bilateral mastectomies for bilateral breast  cancer NUMBER OF LYMPH NODES REMOVED: R 0/4 CHEMOTHERAPY: none RADIATION:none HORMONE TREATMENT: none currently INFECTIONS: none  LYMPHEDEMA ASSESSMENTS:   LANDMARK RIGHT  eval  At axilla  35.5  15 cm proximal to olecranon process 30.5  10 cm proximal to olecranon process 28.9  Olecranon process 25.5  15 cm proximal to ulnar styloid process 24.5  10 cm proximal to ulnar styloid process 22.1  Just proximal to ulnar styloid process 17  Across hand at thumb web space 18.9  At base of 2nd digit 6.5  (Blank rows = not tested)  LANDMARK LEFT  eval  At axilla  34.5  15 cm proximal to olecranon process 30.5  10 cm proximal to olecranon process 29  Olecranon process 25.2  15 cm proximal to ulnar styloid process 24.5  10 cm proximal to ulnar styloid process 22.2  Just proximal to ulnar styloid process 17  Across hand at thumb web space 18.5  At base of 2nd digit 6.2  (Blank rows = not tested)   TODAY'S TREATMENT:                                                                                                                                          DATE:  05/01/23: Therapeutic Exercises Pulleys into flexion and abd x 2 mins each reminding pt to hold stretch with slower pace Roll yellow ball up wall into flex x 10 and then bil abd x 5 each Bil UE 3 way raises with 1# standing with back against wall into flex, scaption and abd returning therapist demo x 10 each. Spent time explaining why better against wall than in middle of floor for erect posture. Handout issued.  Manual Therapy P/ROM of bil shoulders into flex, abd, and D2 with scapular depression by therapist throughout. STM to bil pect tendons during P/ROM being mindful of not pulling at still healing Lt incision  04/29/23: Manual Therapy P/ROM of bil shoulders into flex, abd, and D2 with scapular depression by therapist throughout. Answered pts questions during stretching about drainage she has been seeing on her nightgown.  Educated her that since she reports this has been steadily improving/reducing since surgery, and she does not demonstrate any other signs of infection that this is likely normal post op drainage as she still has one small area in the center of her incision that has yet to close and appears to be a small hole. She was educated about s/s of infection to be aware of tough if anything changes. Also educated pt that the bumps she feels at her chest is not a new mass and in fact are her ribs that she hasn't been able to palpate before due to having breast tissue. Pt is very nervous about cancer returning so spent time encouraging her, within scope of practice, about very small likelihood of this in general, but especially this soon after surgery. Pt verbalized good understanding of all seemed to feel better after discussion with help of interpreter.  Included some  gentle MLD to Rt lateral trunk where more post op edema present: Rt inguinal nodes, and Rt axillo-inguinal anastomosis. Therapeutic Exercises Pulleys into flex and abd x 2 mins each  04/17/23 Manual Therapy P/ROM of bil shoulders into flex, abd, er and IR to pts tolerance - no vcs needed to relax today.  Included some gentle MLD like therapeutic touch to upper arm, shoulder, anterior chest to decrease fear of touch and for desensitization - tolerated well.  Therapeutic Exercises Pulleys into flex and abduction x 3 mins each  Ball wall into flexion x 5  Supine dowel flexion x 5 Supine alternating flexion x 5bil Chest stretch behind the head AROM x 5 Discussed removal of steristrips and demonstrated to pt on myself with paper tape how you can pull them off if there is no resistance and to leave it be if there is resistance to removal.   04/14/23 Manual Therapy P/ROM of bil shoulders into flex, abd, er and IR to pts tolerance and scapular depression by therapist throughout stretches, min-mod VC's to relax during due to muscle guarding Pt teary  when getting shirt off about not liking to look at herself - so we discussed starting to put lotion on the chest (not incision), starting to clean in the shower and slowly starting to touch and look and that it will get harder the longer you wait.  Therapeutic Exercises Piulleys into flex and abduction x 3 mins each returning therapist demo and tactile and VC's to decrease scapular compensation. Pt was able to demonstrate good understanding of this.  Ball wall into flexion x 5    04/08/23: Manual Therapy P/ROM of bil shoulders into flex, abd, er and IR to pts tolerance and scapular depression by therapist throughout stretches, min-mod VC's to relax during due to muscle guarding, but pt was able to do this well MFR gently to bil chest walls during P/ROM, gently to Rt axilla and then horizontally across chest with bil hands Removed gauze pt was still using over drain sites. These are healing well so educated pt that she doesn't need this here any more, she verbalized understanding Therapeutic Exercises Piulleys into flex x 3 mins returning therapist demo and tactile and VC's to decrease scapular compensation. Pt was able to demonstrate good understanding of this. Started instructing pt in low and slow progression of reps and resistance in response to pts questions about being able to start exercising. Also discussed importance of beginning a daily walking program to aid in her recovery.  04/01/23: educated pt in post op breast exercises but educated her to hold off for 4 more days due to recent drain removal   PATIENT EDUCATION:  Education details: Bil UE 3 way raises Person educated: Patient with interpreter Education method: Explanation and Handouts, demonstration, tactile cues Education comprehension: verbalized understanding, returned demonstration, and will benefit from further review  HOME EXERCISE PROGRAM: Post op breast exercises beginning 1 week after last drain removed Bil UE 3 way  raises   ASSESSMENT:  CLINICAL IMPRESSION: Pt reports felt good after last session. Continued and progressed AA/ROM and added bil UE 3 way raises for strengthening. Then continued with manual therapy working to decrease end bil shoulder end ROM tightness. Her Lt mastectomy incision is still open in center but appears to be healing well and scab still present so was mindful of not stretching this during manual therapy.   OBJECTIVE IMPAIRMENTS: decreased balance, decreased knowledge of condition, decreased knowledge of use of DME, decreased ROM, decreased  strength, increased fascial restrictions, impaired UE functional use, postural dysfunction, and pain.   ACTIVITY LIMITATIONS: carrying, lifting, stairs, bathing, dressing, and reach over head  PARTICIPATION LIMITATIONS: meal prep, cleaning, laundry, community activity, and yard work  PERSONAL FACTORS:  none  are also affecting patient's functional outcome.   REHAB POTENTIAL: Good  CLINICAL DECISION MAKING: Stable/uncomplicated  EVALUATION COMPLEXITY: Low  GOALS: Goals reviewed with patient? Yes  SHORT TERM GOALS=LONG TERM GOALS Target date: 04/29/23  Pt will demonstrate 160 degrees of bilateral shoulder flexion to allow her to reach overhead.  Baseline: R 73, L 98 Goal status: INITIAL  2.  Pt will demonstrate 160 degrees of bilateral shoulder abduction to allow her to reach out to the side. Baseline: R 38, L 71 Goal status: INITIAL  3.  Pt will report no pain across bilateral upper quadrants to allow improved comfort.  Baseline:  Goal status: INITIAL  4.  Pt will report no difficulty ascending/descending steps as a result of balance to decrease fall risk.  Baseline:  Goal status: INITIAL  5.  Pt will be independent in a home exercise program for long term stretching and strengthening.  Baseline:  Goal status: INITIAL  6.  Pt will be able to verbalize lymphedema risk reduction practices to decrease risk of lymphedema.   Baseline:  Goal status: INITIAL   PLAN:  PT FREQUENCY: 2x/week  PT DURATION: 4 weeks  PLANNED INTERVENTIONS: 97164- PT Re-evaluation, 97110-Therapeutic exercises, 97530- Therapeutic activity, 97112- Neuromuscular re-education, 97535- Self Care, [id number]- Manual therapy, [phone]- Orthotic Fit/training, Patient/Family education, Balance training, Joint mobilization, Manual lymph drainage, Scar mobilization, Therapeutic exercises, Therapeutic activity, Neuromuscular re-education, Gait training, and Self Care  PLAN FOR NEXT SESSION: Review bil UE 3 way raises continuing to review low and slow progression. Has pt started walking? Cont gentle PROM to bilateral shoulders, AAROM exercises, educate in lymphedema risk reduction practices and issue spanish handouts about lymphedema and risk reduction (same handouts as ABC class - they are up front in bin), eventually work on high level balance  Aden Berwyn Caldron, PTA 05/01/2023, 1:32 PM   3 Way Raises:      Starting Position:  Leaning against wall, walk feet a few inches away from the wall and make tummy tight (tuck hips underneath you) Press back/shoulders/head against wall as much as possible. Keep thumbs up to ceiling, elbows straight and shoulders relaxed/down throughout.  1. Lift arms in front to shoulder height 2. Lift arms a little wider into a V to shoulder height 3. Lift arms out to sides in a T to shoulder height  Perform 10 times in each direction. Hold 1-2 lbs to start with and work up to 2-3 sets of 10/day. Perform 2-3 times/week. Increase weight as able, decreasing sets of 10 each time you increase weights, then slowly working your way back up to 2-3 sets each time.    Cancer Rehab [phone]

## 2023-05-02 ENCOUNTER — Other Ambulatory Visit (HOSPITAL_BASED_OUTPATIENT_CLINIC_OR_DEPARTMENT_OTHER): Payer: Self-pay

## 2023-05-05 ENCOUNTER — Encounter: Payer: Self-pay | Admitting: Rehabilitation

## 2023-05-05 ENCOUNTER — Ambulatory Visit: Payer: Medicare (Managed Care) | Admitting: Rehabilitation

## 2023-05-05 DIAGNOSIS — Z483 Aftercare following surgery for neoplasm: Secondary | ICD-10-CM

## 2023-05-05 DIAGNOSIS — R293 Abnormal posture: Secondary | ICD-10-CM

## 2023-05-05 DIAGNOSIS — M25612 Stiffness of left shoulder, not elsewhere classified: Secondary | ICD-10-CM

## 2023-05-05 DIAGNOSIS — C50112 Malignant neoplasm of central portion of left female breast: Secondary | ICD-10-CM

## 2023-05-05 DIAGNOSIS — C50111 Malignant neoplasm of central portion of right female breast: Secondary | ICD-10-CM

## 2023-05-05 DIAGNOSIS — M25611 Stiffness of right shoulder, not elsewhere classified: Secondary | ICD-10-CM | POA: Diagnosis not present

## 2023-05-05 NOTE — Therapy (Signed)
 OUTPATIENT PHYSICAL THERAPY  UPPER EXTREMITY ONCOLOGY TREATMENT  Patient Name: Lauren Hodge MRN: [id number] DOB:11-06-1955, 68 y.o., female Today's Date: 05/05/2023  END OF SESSION:  PT End of Session - 05/05/23 0958     Visit Number 7    Number of Visits 15    Date for PT Re-Evaluation 06/02/23    Authorization Type none needed    PT Start Time 1000    PT Stop Time 1056    PT Time Calculation (min) 56 min    Activity Tolerance Patient tolerated treatment well    Behavior During Therapy WFL for tasks assessed/performed              Past Medical History:  Diagnosis Date   Breast mass    Carpal tunnel syndrome    Family history of prostate cancer    Fracture of carpal bone 2018   bilateral   GERD (gastroesophageal reflux disease)    Hemorrhoids    Hiatal hernia 02/2019   seen on EGD    History of left breast biopsy    Benign   HTN (hypertension)    Hyperlipidemia    Insomnia    Osteoporosis    Osteoporosis    Vitamin D  deficiency    Past Surgical History:  Procedure Laterality Date   BIOPSY THYROID      BREAST BIOPSY Left 2010   x2, 2010-2012 (no cancer)   BREAST BIOPSY Right 01/10/2023   US  RT BREAST BX W LOC DEV 1ST LESION IMG BX SPEC US  GUIDE 01/10/2023 [facility]   BREAST BIOPSY Right 01/10/2023   MM RT BREAST BX W LOC DEV 1ST LESION IMAGE BX SPEC STEREO GUIDE 01/10/2023 [facility]   BREAST BIOPSY Right 01/22/2023   MM RT BREAST BX W LOC DEV 1ST LESION IMAGE BX SPEC STEREO GUIDE 01/22/2023 [facility]   BREAST BIOPSY Right 01/22/2023   MM RT BREAST BX W LOC DEV EA AD LESION IMG BX SPEC STEREO GUIDE 01/22/2023 [facility]   BREAST BIOPSY Left 01/22/2023   MM LT BREAST BX W LOC DEV 1ST LESION IMAGE BX SPEC STEREO GUIDE 01/22/2023 [facility]   BREAST EXCISIONAL BIOPSY Left    CHOLECYSTECTOMY  2015   HEMORRHOIDECTOMY WITH HEMORRHOID BANDING  04/22/2017   SIMPLE MASTECTOMY WITH AXILLARY SENTINEL NODE BIOPSY Bilateral  03/05/2023   Procedure: BILATERAL MASTECTOMIES WITH BILATERAL MAGTRACE INJECTIONS;  Surgeon: Aron Shoulders, MD;  Location: [facility];  Service: General;  Laterality: Bilateral;  PEC BLOCK   Patient Active Problem List   Diagnosis Date Noted   Bilateral breast cancer (HCC) 03/05/2023   Family history of prostate cancer    Genetic testing 02/07/2023   Ductal carcinoma in situ (DCIS) of right breast 01/21/2023   Malignant neoplasm of central portion of female breast (HCC) 01/14/2023   Trigger finger, right middle finger 09/11/2021   Annual physical exam 10/31/2020   PCP NOTES >>>>>>>>>>>>>>>>>> 08/26/2020   GERD (gastroesophageal reflux disease) 07/26/2020   Hyperlipidemia 10/06/2019   Vitamin D  deficiency 06/18/2018   Hypertensive disorder 05/18/2018    PCP: Aloysius Mech, MD  REFERRING PROVIDER: Shoulders Aron, MD  REFERRING DIAG:  C50.111,Z17.0 (ICD-10-CM) - Malignant neoplasm of central portion of right breast in female, estrogen receptor positive (HCC) C50.112,Z17.0 (ICD-10-CM) - Malignant neoplasm of central portion of left breast in female, estrogen receptor positive (HCC)  THERAPY DIAG:  Stiffness of right shoulder, not elsewhere classified  Stiffness of left shoulder, not elsewhere classified  Abnormal posture  Aftercare following  surgery for neoplasm  Malignant neoplasm of central portion of right breast in female, estrogen receptor positive (HCC)  Malignant neoplasm of central portion of left breast in female, estrogen receptor positive (HCC)  ONSET DATE: 03/05/23  Rationale for Evaluation and Treatment: Rehabilitation  SUBJECTIVE:                                                                                                                                                                                           SUBJECTIVE STATEMENT:  I am moving around much better  PERTINENT HISTORY: 03/05/23: bilateral mastectomies and R SLNB,on L breast cancer:  DCIS, grade 2, greatest dimension: 2.5 mm, ER: 95%, positive, PR: 60%. R breast cancer: DCIS grade 2, greatest dimension 7 mm, ER: 100%, positive, PR: 85%, positive, SLNB 0/4 on R, high blood pressure  PAIN:  Are you having pain? No, not currently, but sometimes Rt shoulder feels really weak and tired  PRECAUTIONS: osteoporosis  RED FLAGS: None   WEIGHT BEARING RESTRICTIONS: No  FALLS:  Has patient fallen in last 6 months? No  LIVING ENVIRONMENT: Lives with: lives with their family - mom, husband, and daughter Lives in: House/apartment Has following equipment at home: None  OCCUPATION: retired  LEISURE: before the surgery exercised in my home a little - walked, did UE and LE exercises, 2x/wk  HAND DOMINANCE: right   PRIOR LEVEL OF FUNCTION: Independent  PATIENT GOALS: to be able to do all the things I did before - to cook, clean house, hand crafts   OBJECTIVE: Note: Objective measures were completed at Evaluation unless otherwise noted.  COGNITION: Overall cognitive status: Within functional limits for tasks assessed   OBSERVATIONS / OTHER ASSESSMENTS: healing mastectomy scars with steri strips intact, gauze over bilateral drain sites due to still draining   POSTURE: forward head, rounded shoulders  UPPER EXTREMITY AROM/PROM:  A/PROM RIGHT   eval  04/14/23 05/05/23  Shoulder extension 45 45 50  Shoulder flexion 73 137 140  Shoulder abduction 38 100 135  Shoulder internal rotation unable    Shoulder external rotation unable 80 85    (Blank rows = not tested)  A/PROM LEFT   eval 04/14/23 05/05/23  Shoulder extension 50 45 50  Shoulder flexion 98 135 140  Shoulder abduction 71 125 140  Shoulder internal rotation unable    Shoulder external rotation unable 80 85    (Blank rows = not tested)   UPPER EXTREMITY STRENGTH:  Bil 5/5 without pain tested 05/05/23  LYMPHEDEMA ASSESSMENTS:  SURGERY TYPE/DATE: 03/05/23- Bilateral mastectomies for bilateral breast  cancer NUMBER OF LYMPH NODES REMOVED: R 0/4 CHEMOTHERAPY: none RADIATION:none HORMONE  TREATMENT: none currently INFECTIONS: none  LYMPHEDEMA ASSESSMENTS:   LANDMARK RIGHT  eval  At axilla  35.5  15 cm proximal to olecranon process 30.5  10 cm proximal to olecranon process 28.9  Olecranon process 25.5  15 cm proximal to ulnar styloid process 24.5  10 cm proximal to ulnar styloid process 22.1  Just proximal to ulnar styloid process 17  Across hand at thumb web space 18.9  At base of 2nd digit 6.5  (Blank rows = not tested)  LANDMARK LEFT  eval  At axilla  34.5  15 cm proximal to olecranon process 30.5  10 cm proximal to olecranon process 29  Olecranon process 25.2  15 cm proximal to ulnar styloid process 24.5  10 cm proximal to ulnar styloid process 22.2  Just proximal to ulnar styloid process 17  Across hand at thumb web space 18.5  At base of 2nd digit 6.2  (Blank rows = not tested)   TODAY'S TREATMENT:                                                                                                                                          DATE:  05/05/23: Rechecked goals and extended POC x Therapeutic Exercises x Pulleys into flexion and abd x 2 mins Roll yellow ball up wall into flex x 10 and then bil abd x 5 each Bil UE 3 way raises with 1# standing with back against wall into flex, scaption and abd returning therapist demo x 10 each.  Row yellow x 10 Bicep curls  1# x 10 Manual Therapyx3min P/ROM of bil shoulders into flex, abd, and D2 with scapular depression by therapist throughout. STM to bil pect tendons during P/ROM being mindful of not pulling at still healing Lt incision  05/01/23: Therapeutic Exercises Pulleys into flexion and abd x 2 mins each reminding pt to hold stretch with slower pace Roll yellow ball up wall into flex x 10 and then bil abd x 5 each Bil UE 3 way raises with 1# standing with back against wall into flex, scaption and abd  returning therapist demo x 10 each. Spent time explaining why better against wall than in middle of floor for erect posture. Handout issued.  Manual Therapy P/ROM of bil shoulders into flex, abd, and D2 with scapular depression by therapist throughout. STM to bil pect tendons during P/ROM being mindful of not pulling at still healing Lt incision  04/29/23: Manual Therapy P/ROM of bil shoulders into flex, abd, and D2 with scapular depression by therapist throughout. Answered pts questions during stretching about drainage she has been seeing on her nightgown. Educated her that since she reports this has been steadily improving/reducing since surgery, and she does not demonstrate any other signs of infection that this is likely normal post op drainage as she still has one small area in the center of her incision  that has yet to close and appears to be a small hole. She was educated about s/s of infection to be aware of tough if anything changes. Also educated pt that the bumps she feels at her chest is not a new mass and in fact are her ribs that she hasn't been able to palpate before due to having breast tissue. Pt is very nervous about cancer returning so spent time encouraging her, within scope of practice, about very small likelihood of this in general, but especially this soon after surgery. Pt verbalized good understanding of all seemed to feel better after discussion with help of interpreter.  Included some gentle MLD to Rt lateral trunk where more post op edema present: Rt inguinal nodes, and Rt axillo-inguinal anastomosis. Therapeutic Exercises Pulleys into flex and abd x 2 mins each   PATIENT EDUCATION:  Education details: Bil UE 3 way raises Person educated: Patient with interpreter Education method: Explanation and Handouts, demonstration, tactile cues Education comprehension: verbalized understanding, returned demonstration, and will benefit from further review  HOME EXERCISE  PROGRAM: Post op breast exercises beginning 1 week after last drain removed Bil UE 3 way raises   ASSESSMENT:  CLINICAL IMPRESSION: Extended POC as pt still has goals to meet and reports that therapy is helping her a lot.  Still has around 20deg of ROM to get to meet goal.  Still having trouble with sweeping and mopping and lifting 4 plates at the same time into the cabinet which is normal for her. Pt reports that her balance is just feeling better already now after surgery and does not have any concerns about this part.   OBJECTIVE IMPAIRMENTS: decreased balance, decreased knowledge of condition, decreased knowledge of use of DME, decreased ROM, decreased strength, increased fascial restrictions, impaired UE functional use, postural dysfunction, and pain.   ACTIVITY LIMITATIONS: carrying, lifting, stairs, bathing, dressing, and reach over head  PARTICIPATION LIMITATIONS: meal prep, cleaning, laundry, community activity, and yard work  PERSONAL FACTORS:  none  are also affecting patient's functional outcome.   REHAB POTENTIAL: Good  CLINICAL DECISION MAKING: Stable/uncomplicated  EVALUATION COMPLEXITY: Low  GOALS: Goals reviewed with patient? Yes  SHORT TERM GOALS=LONG TERM GOALS Target date: 06/02/23  Pt will demonstrate 160 degrees of bilateral shoulder flexion to allow her to reach overhead.  Baseline: R 73, L 98 Goal status: ONGOING  2.  Pt will demonstrate 160 degrees of bilateral shoulder abduction to allow her to reach out to the side. Baseline: R 38, L 71 Goal status: ONGOING  3.  Pt will report no pain across bilateral upper quadrants to allow improved comfort.  Baseline:  Goal status: ONGOING - some pain in the middle of the chest   4.  Pt will report no difficulty ascending/descending steps as a result of balance to decrease fall risk.  Baseline:  Goal status: MET  5.  Pt will be independent in a home exercise program for long term stretching and strengthening.   Baseline:  Goal status: ONGOING  6.  Pt will be able to verbalize lymphedema risk reduction practices to decrease risk of lymphedema.  Baseline:  Goal status: MET   PLAN:  PT FREQUENCY: 2x/week  PT DURATION: 4 weeks  PLANNED INTERVENTIONS: 97164- PT Re-evaluation, 97110-Therapeutic exercises, 97530- Therapeutic activity, 97112- Neuromuscular re-education, 97535- Self Care, [id number]- Manual therapy, [phone]- Orthotic Fit/training, Patient/Family education, Balance training, Joint mobilization, Manual lymph drainage, Scar mobilization, Therapeutic exercises, Therapeutic activity, Neuromuscular re-education, Gait training, and Self Care  PLAN FOR NEXT  SESSION: cont bil shoulder PROM/AAROM/into general strengthening  Larue Saddie SAUNDERS, PT 05/05/2023, 10:58 AM   3 Way Raises:      Starting Position:  Leaning against wall, walk feet a few inches away from the wall and make tummy tight (tuck hips underneath you) Press back/shoulders/head against wall as much as possible. Keep thumbs up to ceiling, elbows straight and shoulders relaxed/down throughout.  1. Lift arms in front to shoulder height 2. Lift arms a little wider into a V to shoulder height 3. Lift arms out to sides in a T to shoulder height  Perform 10 times in each direction. Hold 1-2 lbs to start with and work up to 2-3 sets of 10/day. Perform 2-3 times/week. Increase weight as able, decreasing sets of 10 each time you increase weights, then slowly working your way back up to 2-3 sets each time.    Cancer Rehab [phone]

## 2023-05-07 ENCOUNTER — Ambulatory Visit: Payer: Medicare (Managed Care)

## 2023-05-07 DIAGNOSIS — C50111 Malignant neoplasm of central portion of right female breast: Secondary | ICD-10-CM | POA: Diagnosis not present

## 2023-05-07 DIAGNOSIS — C50912 Malignant neoplasm of unspecified site of left female breast: Secondary | ICD-10-CM | POA: Diagnosis not present

## 2023-05-09 ENCOUNTER — Telehealth: Payer: Self-pay

## 2023-05-09 NOTE — Telephone Encounter (Signed)
Patient's husband Victor(on DPR) advised that Dr Barron Alvine would like her to proceed with colonoscopy March 2025.  Patient s/p bilateral mastectomy in December 2024.  Alecia Lemming advised that patient will need a previsit appointment which is scheduled on 06-13-23 at 10am.  Patient is scheduled for colonoscopy on 06-27-23 at 8am.  Alecia Lemming verbalized understanding and agreed to plan.  No further questions.

## 2023-05-09 NOTE — Telephone Encounter (Signed)
-----   Message from [facility] R sent at 04/02/2023  1:44 PM EST ----- Regarding: [id number] Needs colon in March  Dx hx of colon polyps hx of diverticulits, ATM Gene mutation  VC

## 2023-05-12 DIAGNOSIS — C50912 Malignant neoplasm of unspecified site of left female breast: Secondary | ICD-10-CM | POA: Diagnosis not present

## 2023-05-12 DIAGNOSIS — C50111 Malignant neoplasm of central portion of right female breast: Secondary | ICD-10-CM | POA: Diagnosis not present

## 2023-05-14 ENCOUNTER — Other Ambulatory Visit (HOSPITAL_BASED_OUTPATIENT_CLINIC_OR_DEPARTMENT_OTHER): Payer: Self-pay

## 2023-05-14 ENCOUNTER — Ambulatory Visit: Payer: Medicare (Managed Care)

## 2023-05-14 DIAGNOSIS — M25611 Stiffness of right shoulder, not elsewhere classified: Secondary | ICD-10-CM

## 2023-05-14 DIAGNOSIS — Z483 Aftercare following surgery for neoplasm: Secondary | ICD-10-CM

## 2023-05-14 DIAGNOSIS — M25612 Stiffness of left shoulder, not elsewhere classified: Secondary | ICD-10-CM

## 2023-05-14 DIAGNOSIS — Z170 Estrogen receptor positive status [ER+]: Secondary | ICD-10-CM

## 2023-05-14 DIAGNOSIS — R293 Abnormal posture: Secondary | ICD-10-CM

## 2023-05-14 NOTE — Therapy (Signed)
OUTPATIENT PHYSICAL THERAPY  UPPER EXTREMITY ONCOLOGY TREATMENT  Patient Name: Lauren Hodge MRN: [id number] DOB:[birth year], 68 y.o., female Today's Date: 05/14/2023  END OF SESSION:  PT End of Session - 05/14/23 1008     Visit Number 8    Number of Visits 15    Date for PT Re-Evaluation 06/02/23    Authorization Type none needed    PT Start Time 1005    PT Stop Time 1100    PT Time Calculation (min) 55 min    Activity Tolerance Patient tolerated treatment well    Behavior During Therapy WFL for tasks assessed/performed              Past Medical History:  Diagnosis Date   Breast mass    Carpal tunnel syndrome    Family history of prostate cancer    Fracture of carpal bone 2018   bilateral   GERD (gastroesophageal reflux disease)    Hemorrhoids    Hiatal hernia 02/2019   seen on EGD    History of left breast biopsy    Benign   HTN (hypertension)    Hyperlipidemia    Insomnia    Osteoporosis    Osteoporosis    Vitamin D deficiency    Past Surgical History:  Procedure Laterality Date   BIOPSY THYROID     BREAST BIOPSY Left 2010   x2, 2010-2012 (no cancer)   BREAST BIOPSY Right 01/10/2023   [place] RT BREAST BX W LOC DEV 1ST LESION IMG BX SPEC US GUIDE 01/10/2023 [facility]   BREAST BIOPSY Right 01/10/2023   MM RT BREAST BX W LOC DEV 1ST LESION IMAGE BX SPEC STEREO GUIDE 01/10/2023 [facility]   BREAST BIOPSY Right 01/22/2023   MM RT BREAST BX W LOC DEV 1ST LESION IMAGE BX SPEC STEREO GUIDE 01/22/2023 [facility]   BREAST BIOPSY Right 01/22/2023   MM RT BREAST BX W LOC DEV EA AD LESION IMG BX SPEC STEREO GUIDE 01/22/2023 [facility]   BREAST BIOPSY Left 01/22/2023   MM LT BREAST BX W LOC DEV 1ST LESION IMAGE BX SPEC STEREO GUIDE 01/22/2023 [facility]   BREAST EXCISIONAL BIOPSY Left    CHOLECYSTECTOMY  2015   HEMORRHOIDECTOMY WITH HEMORRHOID BANDING  04/22/2017   SIMPLE MASTECTOMY WITH AXILLARY SENTINEL NODE BIOPSY Bilateral  03/05/2023   Procedure: BILATERAL MASTECTOMIES WITH BILATERAL MAGTRACE INJECTIONS;  Surgeon: Almond Lint, MD;  Location: [facility];  Service: General;  Laterality: Bilateral;  PEC BLOCK   Patient Active Problem List   Diagnosis Date Noted   Bilateral breast cancer (HCC) 03/05/2023   Family history of prostate cancer    Genetic testing 02/07/2023   Ductal carcinoma in situ (DCIS) of right breast 01/21/2023   Malignant neoplasm of central portion of female breast (HCC) 01/14/2023   Trigger finger, right middle finger 09/11/2021   Annual physical exam 10/31/2020   PCP NOTES >>>>>>>>>>>>>>>>>> 08/26/2020   GERD (gastroesophageal reflux disease) 07/26/2020   Hyperlipidemia 10/06/2019   Vitamin D deficiency 06/18/2018   Hypertensive disorder 05/18/2018    PCP: Willow Ora, MD  REFERRING PROVIDER: Almond Lint, MD  REFERRING DIAG:  C50.111,Z17.0 (ICD-10-CM) - Malignant neoplasm of central portion of right breast in female, estrogen receptor positive (HCC) C50.112,Z17.0 (ICD-10-CM) - Malignant neoplasm of central portion of left breast in female, estrogen receptor positive (HCC)  THERAPY DIAG:  Stiffness of right shoulder, not elsewhere classified  Stiffness of left shoulder, not elsewhere classified  Abnormal posture  Aftercare following  surgery for neoplasm  Malignant neoplasm of central portion of right breast in female, estrogen receptor positive (HCC)  Malignant neoplasm of central portion of left breast in female, estrogen receptor positive (HCC)  ONSET DATE: 03/05/23  Rationale for Evaluation and Treatment: Rehabilitation  SUBJECTIVE:                                                                                                                                                                                           SUBJECTIVE STATEMENT:  Mopping has gotten a little easier since I was here last. And my Rt shoulder doesn't feel quite as fatigued and weak  as it used to.   PERTINENT HISTORY: 03/05/23: bilateral mastectomies and R SLNB,on L breast cancer: DCIS, grade 2, greatest dimension: 2.5 mm, ER: 95%, positive, PR: 60%. R breast cancer: DCIS grade 2, greatest dimension 7 mm, ER: 100%, positive, PR: 85%, positive, SLNB 0/4 on R, high blood pressure  PAIN:  Are you having pain? No, just fatigue and weakness in the Rt shoulder but this has gotten better.   PRECAUTIONS: osteoporosis  RED FLAGS: None   WEIGHT BEARING RESTRICTIONS: No  FALLS:  Has patient fallen in last 6 months? No  LIVING ENVIRONMENT: Lives with: lives with their family - mom, husband, and daughter Lives in: House/apartment Has following equipment at home: None  OCCUPATION: retired  LEISURE: before the surgery exercised in my home a little - walked, did UE and LE exercises, 2x/wk  HAND DOMINANCE: right   PRIOR LEVEL OF FUNCTION: Independent  PATIENT GOALS: to be able to do all the things I did before - to cook, clean house, hand crafts   OBJECTIVE: Note: Objective measures were completed at Evaluation unless otherwise noted.  COGNITION: Overall cognitive status: Within functional limits for tasks assessed   OBSERVATIONS / OTHER ASSESSMENTS: healing mastectomy scars with steri strips intact, gauze over bilateral drain sites due to still draining   POSTURE: forward head, rounded shoulders  UPPER EXTREMITY AROM/PROM:  A/PROM RIGHT   eval  04/14/23 05/05/23  Shoulder extension 45 45 50  Shoulder flexion 73 137 140  Shoulder abduction 38 100 135  Shoulder internal rotation unable    Shoulder external rotation unable 80 85    (Blank rows = not tested)  A/PROM LEFT   eval 04/14/23 05/05/23  Shoulder extension 50 45 50  Shoulder flexion 98 135 140  Shoulder abduction 71 125 140  Shoulder internal rotation unable    Shoulder external rotation unable 80 85    (Blank rows = not tested)   UPPER EXTREMITY STRENGTH:  Bil 5/5 without pain tested  05/05/23  LYMPHEDEMA ASSESSMENTS:  SURGERY TYPE/DATE: 03/05/23- Bilateral mastectomies for bilateral breast cancer NUMBER OF LYMPH NODES REMOVED: R 0/4 CHEMOTHERAPY: none RADIATION:none HORMONE TREATMENT: none currently INFECTIONS: none  LYMPHEDEMA ASSESSMENTS:   LANDMARK RIGHT  eval  At axilla  35.5  15 cm proximal to olecranon process 30.5  10 cm proximal to olecranon process 28.9  Olecranon process 25.5  15 cm proximal to ulnar styloid process 24.5  10 cm proximal to ulnar styloid process 22.1  Just proximal to ulnar styloid process 17  Across hand at thumb web space 18.9  At base of 2nd digit 6.5  (Blank rows = not tested)  LANDMARK LEFT  eval  At axilla  34.5  15 cm proximal to olecranon process 30.5  10 cm proximal to olecranon process 29  Olecranon process 25.2  15 cm proximal to ulnar styloid process 24.5  10 cm proximal to ulnar styloid process 22.2  Just proximal to ulnar styloid process 17  Across hand at thumb web space 18.5  At base of 2nd digit 6.2  (Blank rows = not tested)   L-DEX FLOWSHEETS - 05/14/23 1000       L-DEX LYMPHEDEMA SCREENING   Measurement Type Unilateral    L-DEX MEASUREMENT EXTREMITY Upper Extremity    POSITION  Standing    DOMINANT SIDE Right    At Risk Side Right    BASELINE SCORE (UNILATERAL) 2.7    Comment This was taken after surgery so not true baseline              TODAY'S TREATMENT:                                                                                                                                          DATE:  05/14/23: Therapeutic Exercises  Roll yellow ball up wall into flex x 10 and then bil abd x 10 each Pulleys into flexion and abd x 2 mins with VC's to remind pt to decrease scapular compensation/relax shoulders throughout Did SOZO at pts request. Explained to her that this will give [place] helpful information but won't be as accurate without a baseline. She verbalized understanding.  Bil UE 3 way  raises with 2# standing with back against wall into flex, scaption and then 1# abd returning therapist demo x 10 each, pt was challenged by increased weights Manual Therapy P/ROM of bil shoulders into flex, abd, and D2 with scapular depression by therapist throughout. STM to bil pect tendons during P/ROM  05/05/23: Rechecked goals and extended POC x Therapeutic Exercises x Pulleys into flexion and abd x 2 mins Roll yellow ball up wall into flex x 10 and then bil abd x 5 each Bil UE 3 way raises with 1# standing with back against wall into flex, scaption and abd returning therapist demo x 10 each.  Row yellow x 10 Bicep curls  1# x  10 Manual Therapyx38min P/ROM of bil shoulders into flex, abd, and D2 with scapular depression by therapist throughout. STM to bil pect tendons during P/ROM being mindful of not pulling at still healing Lt incision  05/01/23: Therapeutic Exercises Pulleys into flexion and abd x 2 mins each reminding pt to hold stretch with slower pace Roll yellow ball up wall into flex x 10 and then bil abd x 5 each Bil UE 3 way raises with 1# standing with back against wall into flex, scaption and abd returning therapist demo x 10 each. Spent time explaining why better against wall than in middle of floor for erect posture. Handout issued.  Manual Therapy P/ROM of bil shoulders into flex, abd, and D2 with scapular depression by therapist throughout. STM to bil pect tendons during P/ROM being mindful of not pulling at still healing Lt incision  04/29/23: Manual Therapy P/ROM of bil shoulders into flex, abd, and D2 with scapular depression by therapist throughout. Answered pts questions during stretching about drainage she has been seeing on her nightgown. Educated her that since she reports this has been steadily improving/reducing since surgery, and she does not demonstrate any other signs of infection that this is likely normal post op drainage as she still has one small  area in the center of her incision that has yet to close and appears to be a small hole. She was educated about s/s of infection to be aware of tough if anything changes. Also educated pt that the "bumps" she feels at her chest is not a new mass and in fact are her ribs that she hasn't been able to palpate before due to having breast tissue. Pt is very nervous about cancer returning so spent time encouraging her, within scope of practice, about very small likelihood of this in general, but especially this soon after surgery. Pt verbalized good understanding of all seemed to feel better after discussion with help of interpreter.  Included some gentle MLD to Rt lateral trunk where more post op edema present: Rt inguinal nodes, and Rt axillo-inguinal anastomosis. Therapeutic Exercises Pulleys into flex and abd x 2 mins each   PATIENT EDUCATION:  Education details: Bil UE 3 way raises Person educated: Patient with interpreter Education method: Explanation and Handouts, demonstration, tactile cues Education comprehension: verbalized understanding, returned demonstration, and will benefit from further review  HOME EXERCISE PROGRAM: Post op breast exercises beginning 1 week after last drain removed Bil UE 3 way raises   ASSESSMENT:  CLINICAL IMPRESSION: Today continued with bil UE ROM stretches and strength. Progressed weights with bil UE 3 way raises which was challenging for pt. Pt was concerned about her Rt UE since having lymph nodes removed so explained SOZO to her and she requested this. Her score of 2.7, despite having a baseline to compare it to, does indicate that pt is WNLs at this time. She was encouraged by this. Also continued with manual therapy working to decrease bil upper quadrant tightness. Pt continues with some anxiety about healing process and verbalizes being nervous in general since cancer diagnosis about new aches or pains being cancer. Today she was worried about tightness in her  Rt axilla. Encouraged her that this felt like scar tissue which is very normal at this stage of her recovery, especially since she still has end ROM tightness. Pt verbalized understanding.   OBJECTIVE IMPAIRMENTS: decreased balance, decreased knowledge of condition, decreased knowledge of use of DME, decreased ROM, decreased strength, increased fascial restrictions, impaired UE functional  use, postural dysfunction, and pain.   ACTIVITY LIMITATIONS: carrying, lifting, stairs, bathing, dressing, and reach over head  PARTICIPATION LIMITATIONS: meal prep, cleaning, laundry, community activity, and yard work  PERSONAL FACTORS:  none  are also affecting patient's functional outcome.   REHAB POTENTIAL: Good  CLINICAL DECISION MAKING: Stable/uncomplicated  EVALUATION COMPLEXITY: Low  GOALS: Goals reviewed with patient? Yes  SHORT TERM GOALS=LONG TERM GOALS Target date: 06/02/23  Pt will demonstrate 160 degrees of bilateral shoulder flexion to allow her to reach overhead.  Baseline: R 73, L 98 Goal status: ONGOING  2.  Pt will demonstrate 160 degrees of bilateral shoulder abduction to allow her to reach out to the side. Baseline: R 38, L 71 Goal status: ONGOING  3.  Pt will report no pain across bilateral upper quadrants to allow improved comfort.  Baseline:  Goal status: ONGOING - some pain in the middle of the chest   4.  Pt will report no difficulty ascending/descending steps as a result of balance to decrease fall risk.  Baseline:  Goal status: MET  5.  Pt will be independent in a home exercise program for long term stretching and strengthening.  Baseline:  Goal status: ONGOING  6.  Pt will be able to verbalize lymphedema risk reduction practices to decrease risk of lymphedema.  Baseline:  Goal status: MET   PLAN:  PT FREQUENCY: 2x/week  PT DURATION: 4 weeks  PLANNED INTERVENTIONS: 97164- PT Re-evaluation, 97110-Therapeutic exercises, 97530- Therapeutic activity, 97112-  Neuromuscular re-education, 97535- Self Care, 16109- Manual therapy, [phone]- Orthotic Fit/training, Patient/Family education, Balance training, Joint mobilization, Manual lymph drainage, Scar mobilization, Therapeutic exercises, Therapeutic activity, Neuromuscular re-education, Gait training, and Self Care  PLAN FOR NEXT SESSION: cont bil shoulder PROM/AAROM/into general strengthening; progress weights with pt as able  Hermenia Bers, PTA 05/14/2023, 12:35 PM   3 Way Raises:      Starting Position:  Leaning against wall, walk feet a few inches away from the wall and make tummy tight (tuck hips underneath you) Press back/shoulders/head against wall as much as possible. Keep thumbs up to ceiling, elbows straight and shoulders relaxed/down throughout.  1. Lift arms in front to shoulder height 2. Lift arms a little wider into a "V" to shoulder height 3. Lift arms out to sides in a "T" to shoulder height  Perform 10 times in each direction. Hold 1-2 lbs to start with and work up to 2-3 sets of 10/day. Perform 2-3 times/week. Increase weight as able, decreasing sets of 10 each time you increase weights, then slowly working your way back up to 2-3 sets each time.    Cancer Rehab [phone]

## 2023-05-16 ENCOUNTER — Ambulatory Visit: Payer: Medicare (Managed Care)

## 2023-05-20 DIAGNOSIS — C50111 Malignant neoplasm of central portion of right female breast: Secondary | ICD-10-CM | POA: Diagnosis not present

## 2023-05-20 DIAGNOSIS — C50912 Malignant neoplasm of unspecified site of left female breast: Secondary | ICD-10-CM | POA: Diagnosis not present

## 2023-05-27 ENCOUNTER — Ambulatory Visit: Payer: Medicare (Managed Care) | Attending: General Surgery | Admitting: Rehabilitation

## 2023-05-27 ENCOUNTER — Encounter: Payer: Self-pay | Admitting: Rehabilitation

## 2023-05-27 DIAGNOSIS — C50111 Malignant neoplasm of central portion of right female breast: Secondary | ICD-10-CM | POA: Diagnosis not present

## 2023-05-27 DIAGNOSIS — Z170 Estrogen receptor positive status [ER+]: Secondary | ICD-10-CM | POA: Insufficient documentation

## 2023-05-27 DIAGNOSIS — C50112 Malignant neoplasm of central portion of left female breast: Secondary | ICD-10-CM | POA: Diagnosis not present

## 2023-05-27 DIAGNOSIS — M25611 Stiffness of right shoulder, not elsewhere classified: Secondary | ICD-10-CM | POA: Diagnosis not present

## 2023-05-27 DIAGNOSIS — R293 Abnormal posture: Secondary | ICD-10-CM | POA: Insufficient documentation

## 2023-05-27 DIAGNOSIS — M25612 Stiffness of left shoulder, not elsewhere classified: Secondary | ICD-10-CM | POA: Diagnosis not present

## 2023-05-27 DIAGNOSIS — Z483 Aftercare following surgery for neoplasm: Secondary | ICD-10-CM | POA: Diagnosis not present

## 2023-05-27 NOTE — Therapy (Signed)
 OUTPATIENT PHYSICAL THERAPY  UPPER EXTREMITY ONCOLOGY TREATMENT  Patient Name: Lauren Hodge MRN: [id number] DOB:06/16/1955, 68 y.o., female Today's Date: 05/27/2023  END OF SESSION:  PT End of Session - 05/27/23 0857     Visit Number 9    Number of Visits 15    Date for PT Re-Evaluation 06/02/23    PT Start Time 0900    PT Stop Time 0955    PT Time Calculation (min) 55 min    Activity Tolerance Patient tolerated treatment well    Behavior During Therapy WFL for tasks assessed/performed              Past Medical History:  Diagnosis Date   Breast mass    Carpal tunnel syndrome    Family history of prostate cancer    Fracture of carpal bone 2018   bilateral   GERD (gastroesophageal reflux disease)    Hemorrhoids    Hiatal hernia 02/2019   seen on EGD    History of left breast biopsy    Benign   HTN (hypertension)    Hyperlipidemia    Insomnia    Osteoporosis    Osteoporosis    Vitamin D  deficiency    Past Surgical History:  Procedure Laterality Date   BIOPSY THYROID      BREAST BIOPSY Left 2010   x2, 2010-2012 (no cancer)   BREAST BIOPSY Right 01/10/2023   US  RT BREAST BX W LOC DEV 1ST LESION IMG BX SPEC US  GUIDE 01/10/2023 [facility]   BREAST BIOPSY Right 01/10/2023   MM RT BREAST BX W LOC DEV 1ST LESION IMAGE BX SPEC STEREO GUIDE 01/10/2023 [facility]   BREAST BIOPSY Right 01/22/2023   MM RT BREAST BX W LOC DEV 1ST LESION IMAGE BX SPEC STEREO GUIDE 01/22/2023 [facility]   BREAST BIOPSY Right 01/22/2023   MM RT BREAST BX W LOC DEV EA AD LESION IMG BX SPEC STEREO GUIDE 01/22/2023 [facility]   BREAST BIOPSY Left 01/22/2023   MM LT BREAST BX W LOC DEV 1ST LESION IMAGE BX SPEC STEREO GUIDE 01/22/2023 [facility]   BREAST EXCISIONAL BIOPSY Left    CHOLECYSTECTOMY  2015   HEMORRHOIDECTOMY WITH HEMORRHOID BANDING  04/22/2017   SIMPLE MASTECTOMY WITH AXILLARY SENTINEL NODE BIOPSY Bilateral 03/05/2023   Procedure: BILATERAL  MASTECTOMIES WITH BILATERAL MAGTRACE INJECTIONS;  Surgeon: Aron Shoulders, MD;  Location: [facility];  Service: General;  Laterality: Bilateral;  PEC BLOCK   Patient Active Problem List   Diagnosis Date Noted   Bilateral breast cancer (HCC) 03/05/2023   Family history of prostate cancer    Genetic testing 02/07/2023   Ductal carcinoma in situ (DCIS) of right breast 01/21/2023   Malignant neoplasm of central portion of female breast (HCC) 01/14/2023   Trigger finger, right middle finger 09/11/2021   Annual physical exam 10/31/2020   PCP NOTES >>>>>>>>>>>>>>>>>> 08/26/2020   GERD (gastroesophageal reflux disease) 07/26/2020   Hyperlipidemia 10/06/2019   Vitamin D  deficiency 06/18/2018   Hypertensive disorder 05/18/2018    PCP: Aloysius Mech, MD  REFERRING PROVIDER: Shoulders Aron, MD  REFERRING DIAG:  C50.111,Z17.0 (ICD-10-CM) - Malignant neoplasm of central portion of right breast in female, estrogen receptor positive (HCC) C50.112,Z17.0 (ICD-10-CM) - Malignant neoplasm of central portion of left breast in female, estrogen receptor positive (HCC)  THERAPY DIAG:  Stiffness of right shoulder, not elsewhere classified  Stiffness of left shoulder, not elsewhere classified  Abnormal posture  Aftercare following surgery for neoplasm  Malignant neoplasm of  central portion of right breast in female, estrogen receptor positive (HCC)  Malignant neoplasm of central portion of left breast in female, estrogen receptor positive (HCC)  ONSET DATE: 03/05/23  Rationale for Evaluation and Treatment: Rehabilitation  SUBJECTIVE:                                                                                                                                                                                           SUBJECTIVE STATEMENT:  I would like to do a little more visits.  I want to work on the stretching on the arm.  It really doesn't hurt much.    PERTINENT HISTORY: 03/05/23:  bilateral mastectomies and R SLNB,on L breast cancer: DCIS, grade 2, greatest dimension: 2.5 mm, ER: 95%, positive, PR: 60%. R breast cancer: DCIS grade 2, greatest dimension 7 mm, ER: 100%, positive, PR: 85%, positive, SLNB 0/4 on R, high blood pressure  PAIN:  Are you having pain? No, just fatigue and weakness in the Rt shoulder but this has gotten better.   PRECAUTIONS: osteoporosis  RED FLAGS: None   WEIGHT BEARING RESTRICTIONS: No  FALLS:  Has patient fallen in last 6 months? No  LIVING ENVIRONMENT: Lives with: lives with their family - mom, husband, and daughter Lives in: House/apartment Has following equipment at home: None  OCCUPATION: retired  LEISURE: before the surgery exercised in my home a little - walked, did UE and LE exercises, 2x/wk  HAND DOMINANCE: right   PRIOR LEVEL OF FUNCTION: Independent  PATIENT GOALS: to be able to do all the things I did before - to cook, clean house, hand crafts   OBJECTIVE: Note: Objective measures were completed at Evaluation unless otherwise noted.  COGNITION: Overall cognitive status: Within functional limits for tasks assessed   OBSERVATIONS / OTHER ASSESSMENTS: healing mastectomy scars with steri strips intact, gauze over bilateral drain sites due to still draining   POSTURE: forward head, rounded shoulders  UPPER EXTREMITY AROM/PROM:  A/PROM RIGHT   eval  04/14/23 05/05/23 05/27/23  Shoulder extension 45 45 50   Shoulder flexion 73 137 140 150  Shoulder abduction 38 100 135 170  Shoulder internal rotation unable     Shoulder external rotation unable 80 85     (Blank rows = not tested)  A/PROM LEFT   eval 04/14/23 05/05/23 05/27/23  Shoulder extension 50 45 50   Shoulder flexion 98 135 140 152  Shoulder abduction 71 125 140 170  Shoulder internal rotation unable     Shoulder external rotation unable 80 85     (Blank rows = not tested)   UPPER EXTREMITY STRENGTH:  Bil  5/5 without pain tested  05/05/23  LYMPHEDEMA ASSESSMENTS:  SURGERY TYPE/DATE: 03/05/23- Bilateral mastectomies for bilateral breast cancer NUMBER OF LYMPH NODES REMOVED: R 0/4 CHEMOTHERAPY: none RADIATION:none HORMONE TREATMENT: none currently INFECTIONS: none  LYMPHEDEMA ASSESSMENTS:   LANDMARK RIGHT  eval  At axilla  35.5  15 cm proximal to olecranon process 30.5  10 cm proximal to olecranon process 28.9  Olecranon process 25.5  15 cm proximal to ulnar styloid process 24.5  10 cm proximal to ulnar styloid process 22.1  Just proximal to ulnar styloid process 17  Across hand at thumb web space 18.9  At base of 2nd digit 6.5  (Blank rows = not tested)  LANDMARK LEFT  eval  At axilla  34.5  15 cm proximal to olecranon process 30.5  10 cm proximal to olecranon process 29  Olecranon process 25.2  15 cm proximal to ulnar styloid process 24.5  10 cm proximal to ulnar styloid process 22.2  Just proximal to ulnar styloid process 17  Across hand at thumb web space 18.5  At base of 2nd digit 6.2  (Blank rows = not tested)      TODAY'S TREATMENT:                                                                                                                                          DATE:  05/27/23 Discussed POC and goals x Therapeutic Exercises x  Roll yellow ball up wall into flex x 10 and then bil abd x 10 each Pulleys into flexion and abd x 2 mins with VC's to remind pt to decrease scapular compensation/relax shoulders throughout Bil UE 3 way raises with 2# standing with back against wall into flex, scaption and then 1# abd returning therapist demo x 10 each, pt was challenged by increased weights Row red x 15 Doorway stretch Manual Therapy x P/ROM of Rt shoulder into flex, abd, and D2 with scapular depression by therapist throughout. STM to Rt pect tendons during P/ROM  05/14/23: Therapeutic Exercises  Roll yellow ball up wall into flex x 10 and then bil abd x 10  each Pulleys into flexion and abd x 2 mins with VC's to remind pt to decrease scapular compensation/relax shoulders throughout Did SOZO at pts request. Explained to her that this will give us  helpful information but won't be as accurate without a baseline. She verbalized understanding.  Bil UE 3 way raises with 2# standing with back against wall into flex, scaption and then 1# abd returning therapist demo x 10 each, pt was challenged by increased weights Manual Therapy P/ROM of bil shoulders into flex, abd, and D2 with scapular depression by therapist throughout. STM to bil pect tendons during P/ROM  05/05/23: Rechecked goals and extended POC x Therapeutic Exercises x Pulleys into flexion and abd x 2 mins Roll yellow ball up wall into flex  x 10 and then bil abd x 5 each Bil UE 3 way raises with 1# standing with back against wall into flex, scaption and abd returning therapist demo x 10 each.  Row yellow x 10 Bicep curls  1# x 10 Manual Therapyx78min P/ROM of bil shoulders into flex, abd, and D2 with scapular depression by therapist throughout. STM to bil pect tendons during P/ROM being mindful of not pulling at still healing Lt incision  05/01/23: Therapeutic Exercises Pulleys into flexion and abd x 2 mins each reminding pt to hold stretch with slower pace Roll yellow ball up wall into flex x 10 and then bil abd x 5 each Bil UE 3 way raises with 1# standing with back against wall into flex, scaption and abd returning therapist demo x 10 each. Spent time explaining why better against wall than in middle of floor for erect posture. Handout issued.  Manual Therapy P/ROM of bil shoulders into flex, abd, and D2 with scapular depression by therapist throughout. STM to bil pect tendons during P/ROM being mindful of not pulling at still healing Lt incision  04/29/23: Manual Therapy P/ROM of bil shoulders into flex, abd, and D2 with scapular depression by therapist throughout. Answered  pts questions during stretching about drainage she has been seeing on her nightgown. Educated her that since she reports this has been steadily improving/reducing since surgery, and she does not demonstrate any other signs of infection that this is likely normal post op drainage as she still has one small area in the center of her incision that has yet to close and appears to be a small hole. She was educated about s/s of infection to be aware of tough if anything changes. Also educated pt that the bumps she feels at her chest is not a new mass and in fact are her ribs that she hasn't been able to palpate before due to having breast tissue. Pt is very nervous about cancer returning so spent time encouraging her, within scope of practice, about very small likelihood of this in general, but especially this soon after surgery. Pt verbalized good understanding of all seemed to feel better after discussion with help of interpreter.  Included some gentle MLD to Rt lateral trunk where more post op edema present: Rt inguinal nodes, and Rt axillo-inguinal anastomosis. Therapeutic Exercises Pulleys into flex and abd x 2 mins each   PATIENT EDUCATION:  Education details: Bil UE 3 way raises Person educated: Patient with interpreter Education method: Explanation and Handouts, demonstration, tactile cues Education comprehension: verbalized understanding, returned demonstration, and will benefit from further review  HOME EXERCISE PROGRAM: Post op breast exercises beginning 1 week after last drain removed Bil UE 3 way raises   ASSESSMENT:  CLINICAL IMPRESSION: Today continued with bil UE ROM stretches and strength. Progressed weights with bil UE 3 way raises which was challenging for pt. Pt was concerned about her Rt UE since having lymph nodes removed so explained SOZO to her and she requested this. Her score of 2.7, despite having a baseline to compare it to, does indicate that pt is WNLs at this time. She  was encouraged by this. Also continued with manual therapy working to decrease bil upper quadrant tightness. Pt continues with some anxiety about healing process and verbalizes being nervous in general since cancer diagnosis about new aches or pains being cancer. Today she was worried about tightness in her Rt axilla. Encouraged her that this felt like scar tissue which is very normal at  this stage of her recovery, especially since she still has end ROM tightness. Pt verbalized understanding.   OBJECTIVE IMPAIRMENTS: decreased balance, decreased knowledge of condition, decreased knowledge of use of DME, decreased ROM, decreased strength, increased fascial restrictions, impaired UE functional use, postural dysfunction, and pain.   ACTIVITY LIMITATIONS: carrying, lifting, stairs, bathing, dressing, and reach over head  PARTICIPATION LIMITATIONS: meal prep, cleaning, laundry, community activity, and yard work  PERSONAL FACTORS:  none  are also affecting patient's functional outcome.   REHAB POTENTIAL: Good  CLINICAL DECISION MAKING: Stable/uncomplicated  EVALUATION COMPLEXITY: Low  GOALS: Goals reviewed with patient? Yes  SHORT TERM GOALS=LONG TERM GOALS Target date: 06/17/23  Pt will demonstrate 160 degrees of bilateral shoulder flexion to allow her to reach overhead.  Baseline: bil 153 Goal status: ONGOING  2.  Pt will demonstrate 160 degrees of bilateral shoulder abduction to allow her to reach out to the side. Baseline: R 38, L 71 Goal status: MET  3.  Pt will report no pain across bilateral upper quadrants to allow improved comfort.  Baseline:  Goal status: MET   4.  Pt will report no difficulty ascending/descending steps as a result of balance to decrease fall risk.  Baseline:  Goal status: MET  5.  Pt will be independent in a home exercise program for long term stretching and strengthening.  Baseline:  Goal status: ONGOING  6.  Pt will be able to verbalize lymphedema risk  reduction practices to decrease risk of lymphedema.  Baseline:  Goal status: MET   PLAN:  PT FREQUENCY: 2x/week  PT DURATION: 4 weeks  PLANNED INTERVENTIONS: 97164- PT Re-evaluation, 97110-Therapeutic exercises, 97530- Therapeutic activity, 97112- Neuromuscular re-education, 97535- Self Care, [id number]- Manual therapy, [phone]- Orthotic Fit/training, Patient/Family education, Balance training, Joint mobilization, Manual lymph drainage, Scar mobilization, Therapeutic exercises, Therapeutic activity, Neuromuscular re-education, Gait training, and Self Care  PLAN FOR NEXT SESSION: cont bil shoulder PROM/AAROM/into general strengthening; progress weights with pt as able  Larue Saddie SAUNDERS, PT 05/27/2023, 9:59 AM   3 Way Raises:      Starting Position:  Leaning against wall, walk feet a few inches away from the wall and make tummy tight (tuck hips underneath you) Press back/shoulders/head against wall as much as possible. Keep thumbs up to ceiling, elbows straight and shoulders relaxed/down throughout.  1. Lift arms in front to shoulder height 2. Lift arms a little wider into a V to shoulder height 3. Lift arms out to sides in a T to shoulder height  Perform 10 times in each direction. Hold 1-2 lbs to start with and work up to 2-3 sets of 10/day. Perform 2-3 times/week. Increase weight as able, decreasing sets of 10 each time you increase weights, then slowly working your way back up to 2-3 sets each time.    Cancer Rehab [phone]

## 2023-05-29 DIAGNOSIS — C50111 Malignant neoplasm of central portion of right female breast: Secondary | ICD-10-CM | POA: Diagnosis not present

## 2023-05-29 DIAGNOSIS — C50912 Malignant neoplasm of unspecified site of left female breast: Secondary | ICD-10-CM | POA: Diagnosis not present

## 2023-06-03 ENCOUNTER — Ambulatory Visit: Payer: Medicare (Managed Care)

## 2023-06-03 DIAGNOSIS — M25612 Stiffness of left shoulder, not elsewhere classified: Secondary | ICD-10-CM

## 2023-06-03 DIAGNOSIS — R293 Abnormal posture: Secondary | ICD-10-CM

## 2023-06-03 DIAGNOSIS — M25611 Stiffness of right shoulder, not elsewhere classified: Secondary | ICD-10-CM

## 2023-06-03 DIAGNOSIS — Z170 Estrogen receptor positive status [ER+]: Secondary | ICD-10-CM

## 2023-06-03 DIAGNOSIS — Z483 Aftercare following surgery for neoplasm: Secondary | ICD-10-CM

## 2023-06-03 DIAGNOSIS — C50111 Malignant neoplasm of central portion of right female breast: Secondary | ICD-10-CM

## 2023-06-03 NOTE — Therapy (Signed)
OUTPATIENT PHYSICAL THERAPY  UPPER EXTREMITY ONCOLOGY TREATMENT  Patient Name: Lauren Hodge MRN: [id number] DOB:08-Nov-1955, 68 y.o., female Today's Date: 06/03/2023  END OF SESSION:  PT End of Session - 06/03/23 0917     Visit Number 10    Number of Visits 15    Date for PT Re-Evaluation 06/02/23    PT Start Time 0912   pt arrived late   PT Stop Time 1001    PT Time Calculation (min) 49 min    Activity Tolerance Patient tolerated treatment well    Behavior During Therapy WFL for tasks assessed/performed              Past Medical History:  Diagnosis Date   Breast mass    Carpal tunnel syndrome    Family history of prostate cancer    Fracture of carpal bone 2018   bilateral   GERD (gastroesophageal reflux disease)    Hemorrhoids    Hiatal hernia 02/2019   seen on EGD    History of left breast biopsy    Benign   HTN (hypertension)    Hyperlipidemia    Insomnia    Osteoporosis    Osteoporosis    Vitamin D deficiency    Past Surgical History:  Procedure Laterality Date   BIOPSY THYROID     BREAST BIOPSY Left 2010   x2, 2010-2012 (no cancer)   BREAST BIOPSY Right 01/10/2023   [place] RT BREAST BX W LOC DEV 1ST LESION IMG BX SPEC US GUIDE 01/10/2023 [facility]   BREAST BIOPSY Right 01/10/2023   MM RT BREAST BX W LOC DEV 1ST LESION IMAGE BX SPEC STEREO GUIDE 01/10/2023 [facility]   BREAST BIOPSY Right 01/22/2023   MM RT BREAST BX W LOC DEV 1ST LESION IMAGE BX SPEC STEREO GUIDE 01/22/2023 [facility]   BREAST BIOPSY Right 01/22/2023   MM RT BREAST BX W LOC DEV EA AD LESION IMG BX SPEC STEREO GUIDE 01/22/2023 [facility]   BREAST BIOPSY Left 01/22/2023   MM LT BREAST BX W LOC DEV 1ST LESION IMAGE BX SPEC STEREO GUIDE 01/22/2023 [facility]   BREAST EXCISIONAL BIOPSY Left    CHOLECYSTECTOMY  2015   HEMORRHOIDECTOMY WITH HEMORRHOID BANDING  04/22/2017   SIMPLE MASTECTOMY WITH AXILLARY SENTINEL NODE BIOPSY Bilateral 03/05/2023    Procedure: BILATERAL MASTECTOMIES WITH BILATERAL MAGTRACE INJECTIONS;  Surgeon: Almond Lint, MD;  Location: [facility];  Service: General;  Laterality: Bilateral;  PEC BLOCK   Patient Active Problem List   Diagnosis Date Noted   Bilateral breast cancer (HCC) 03/05/2023   Family history of prostate cancer    Genetic testing 02/07/2023   Ductal carcinoma in situ (DCIS) of right breast 01/21/2023   Malignant neoplasm of central portion of female breast (HCC) 01/14/2023   Trigger finger, right middle finger 09/11/2021   Annual physical exam 10/31/2020   PCP NOTES >>>>>>>>>>>>>>>>>> 08/26/2020   GERD (gastroesophageal reflux disease) 07/26/2020   Hyperlipidemia 10/06/2019   Vitamin D deficiency 06/18/2018   Hypertensive disorder 05/18/2018    PCP: Willow Ora, MD  REFERRING PROVIDER: Almond Lint, MD  REFERRING DIAG:  C50.111,Z17.0 (ICD-10-CM) - Malignant neoplasm of central portion of right breast in female, estrogen receptor positive (HCC) C50.112,Z17.0 (ICD-10-CM) - Malignant neoplasm of central portion of left breast in female, estrogen receptor positive (HCC)  THERAPY DIAG:  Stiffness of right shoulder, not elsewhere classified  Stiffness of left shoulder, not elsewhere classified  Abnormal posture  Aftercare following surgery for neoplasm  Malignant neoplasm of central portion of right breast in female, estrogen receptor positive (HCC)  Malignant neoplasm of central portion of left breast in female, estrogen receptor positive (HCC)  ONSET DATE: 03/05/23  Rationale for Evaluation and Treatment: Rehabilitation  SUBJECTIVE:                                                                                                                                                                                           SUBJECTIVE STATEMENT:  I am feeling good today, I feel better after I stretch. My Rt arm hasn't been feeling as tired.    PERTINENT HISTORY: 03/05/23:  bilateral mastectomies and R SLNB,on L breast cancer: DCIS, grade 2, greatest dimension: 2.5 mm, ER: 95%, positive, PR: 60%. R breast cancer: DCIS grade 2, greatest dimension 7 mm, ER: 100%, positive, PR: 85%, positive, SLNB 0/4 on R, high blood pressure  PAIN:  Are you having pain? No  PRECAUTIONS: osteoporosis  RED FLAGS: None   WEIGHT BEARING RESTRICTIONS: No  FALLS:  Has patient fallen in last 6 months? No  LIVING ENVIRONMENT: Lives with: lives with their family - mom, husband, and daughter Lives in: House/apartment Has following equipment at home: None  OCCUPATION: retired  LEISURE: before the surgery exercised in my home a little - walked, did UE and LE exercises, 2x/wk  HAND DOMINANCE: right   PRIOR LEVEL OF FUNCTION: Independent  PATIENT GOALS: to be able to do all the things I did before - to cook, clean house, hand crafts   OBJECTIVE: Note: Objective measures were completed at Evaluation unless otherwise noted.  COGNITION: Overall cognitive status: Within functional limits for tasks assessed   OBSERVATIONS / OTHER ASSESSMENTS: healing mastectomy scars with steri strips intact, gauze over bilateral drain sites due to still draining   POSTURE: forward head, rounded shoulders  UPPER EXTREMITY AROM/PROM:  A/PROM RIGHT   eval  04/14/23 05/05/23 05/27/23  Shoulder extension 45 45 50   Shoulder flexion 73 137 140 150  Shoulder abduction 38 100 135 170  Shoulder internal rotation unable     Shoulder external rotation unable 80 85     (Blank rows = not tested)  A/PROM LEFT   eval 04/14/23 05/05/23 05/27/23  Shoulder extension 50 45 50   Shoulder flexion 98 135 140 152  Shoulder abduction 71 125 140 170  Shoulder internal rotation unable     Shoulder external rotation unable 80 85     (Blank rows = not tested)   UPPER EXTREMITY STRENGTH:  Bil 5/5 without pain tested 05/05/23  LYMPHEDEMA ASSESSMENTS:  SURGERY TYPE/DATE: 03/05/23- Bilateral mastectomies for  bilateral breast  cancer NUMBER OF LYMPH NODES REMOVED: R 0/4 CHEMOTHERAPY: none RADIATION:none HORMONE TREATMENT: none currently INFECTIONS: none  LYMPHEDEMA ASSESSMENTS:   LANDMARK RIGHT  eval  At axilla  35.5  15 cm proximal to olecranon process 30.5  10 cm proximal to olecranon process 28.9  Olecranon process 25.5  15 cm proximal to ulnar styloid process 24.5  10 cm proximal to ulnar styloid process 22.1  Just proximal to ulnar styloid process 17  Across hand at thumb web space 18.9  At base of 2nd digit 6.5  (Blank rows = not tested)  LANDMARK LEFT  eval  At axilla  34.5  15 cm proximal to olecranon process 30.5  10 cm proximal to olecranon process 29  Olecranon process 25.2  15 cm proximal to ulnar styloid process 24.5  10 cm proximal to ulnar styloid process 22.2  Just proximal to ulnar styloid process 17  Across hand at thumb web space 18.5  At base of 2nd digit 6.2  (Blank rows = not tested)      TODAY'S TREATMENT:                                                                                                                                          DATE:  06/03/23: Therapeutic Exercises   Roll yellow ball up wall into flex x 10 and then bil abd x 10 each Pulleys into flexion and abd x 2 mins with VC's to remind pt to decrease scapular compensation/relax shoulders throughout Modified downward dog on wall x 5 reps, 5 sec holds Bil UE 3 way raises with 2# standing with back against wall into flex, scaption and then 1# abd returning therapist demo x 10 each, pt was challenged by increased weights and VC's for correct technique with back against wall/core engaged [employer] for scapular retraction and then bil UE ext x 10 each, demo and tactile cues for correct UE position and technique Doorway stretch x 3 reps, 20 sec Manual Therapy  P/ROM of Rt shoulder into flex, abd, and D2 with scapular depression by therapist throughout. STM to Rt pect tendons  during P/ROM  05/27/23 Discussed POC and goals x Therapeutic Exercises x  Roll yellow ball up wall into flex x 10 and then bil abd x 10 each Pulleys into flexion and abd x 2 mins with VC's to remind pt to decrease scapular compensation/relax shoulders throughout Bil UE 3 way raises with 2# standing with back against wall into flex, scaption and then 1# abd returning therapist demo x 10 each, pt was challenged by increased weights Row red x 15 Doorway stretch Manual Therapy x P/ROM of Rt shoulder into flex, abd, and D2 with scapular depression by therapist throughout. STM to Rt pect tendons during P/ROM  05/14/23: Therapeutic Exercises  Roll yellow ball up wall into flex x 10 and then bil abd x  10 each Pulleys into flexion and abd x 2 mins with VC's to remind pt to decrease scapular compensation/relax shoulders throughout Did SOZO at pts request. Explained to her that this will give [place] helpful information but won't be as accurate without a baseline. She verbalized understanding.  Bil UE 3 way raises with 2# standing with back against wall into flex, scaption and then 1# abd returning therapist demo x 10 each, pt was challenged by increased weights Manual Therapy P/ROM of bil shoulders into flex, abd, and D2 with scapular depression by therapist throughout. STM to bil pect tendons during P/ROM  05/05/23: Rechecked goals and extended POC x Therapeutic Exercises x Pulleys into flexion and abd x 2 mins Roll yellow ball up wall into flex x 10 and then bil abd x 5 each Bil UE 3 way raises with 1# standing with back against wall into flex, scaption and abd returning therapist demo x 10 each.  Row yellow x 10 Bicep curls  1# x 10 Manual Therapyx32min P/ROM of bil shoulders into flex, abd, and D2 with scapular depression by therapist throughout. STM to bil pect tendons during P/ROM being mindful of not pulling at still healing Lt incision  05/01/23: Therapeutic  Exercises Pulleys into flexion and abd x 2 mins each reminding pt to hold stretch with slower pace Roll yellow ball up wall into flex x 10 and then bil abd x 5 each Bil UE 3 way raises with 1# standing with back against wall into flex, scaption and abd returning therapist demo x 10 each. Spent time explaining why better against wall than in middle of floor for erect posture. Handout issued.  Manual Therapy P/ROM of bil shoulders into flex, abd, and D2 with scapular depression by therapist throughout. STM to bil pect tendons during P/ROM being mindful of not pulling at still healing Lt incision  04/29/23: Manual Therapy P/ROM of bil shoulders into flex, abd, and D2 with scapular depression by therapist throughout. Answered pts questions during stretching about drainage she has been seeing on her nightgown. Educated her that since she reports this has been steadily improving/reducing since surgery, and she does not demonstrate any other signs of infection that this is likely normal post op drainage as she still has one small area in the center of her incision that has yet to close and appears to be a small hole. She was educated about s/s of infection to be aware of tough if anything changes. Also educated pt that the "bumps" she feels at her chest is not a new mass and in fact are her ribs that she hasn't been able to palpate before due to having breast tissue. Pt is very nervous about cancer returning so spent time encouraging her, within scope of practice, about very small likelihood of this in general, but especially this soon after surgery. Pt verbalized good understanding of all seemed to feel better after discussion with help of interpreter.  Included some gentle MLD to Rt lateral trunk where more post op edema present: Rt inguinal nodes, and Rt axillo-inguinal anastomosis. Therapeutic Exercises Pulleys into flex and abd x 2 mins each   PATIENT EDUCATION:  Education details: Bil UE 3 way  raises Person educated: Patient with interpreter Education method: Explanation and Handouts, demonstration, tactile cues Education comprehension: verbalized understanding, returned demonstration, and will benefit from further review  HOME EXERCISE PROGRAM: Post op breast exercises beginning 1 week after last drain removed Bil UE 3 way raises   ASSESSMENT:  CLINICAL IMPRESSION: Continued with progressing postural strength and bil UE flexibility. Pt reports feeling good stretches with these and can tell overall her Rt UE fatigue has improved. Her Rt sholder end P/ROM is still some limited by fascial restrictions but this is softening at Rt pect and lateral trunk from when this therapist last saw her.   OBJECTIVE IMPAIRMENTS: decreased balance, decreased knowledge of condition, decreased knowledge of use of DME, decreased ROM, decreased strength, increased fascial restrictions, impaired UE functional use, postural dysfunction, and pain.   ACTIVITY LIMITATIONS: carrying, lifting, stairs, bathing, dressing, and reach over head  PARTICIPATION LIMITATIONS: meal prep, cleaning, laundry, community activity, and yard work  PERSONAL FACTORS:  none  are also affecting patient's functional outcome.   REHAB POTENTIAL: Good  CLINICAL DECISION MAKING: Stable/uncomplicated  EVALUATION COMPLEXITY: Low  GOALS: Goals reviewed with patient? Yes  SHORT TERM GOALS=LONG TERM GOALS Target date: 06/17/23  Pt will demonstrate 160 degrees of bilateral shoulder flexion to allow her to reach overhead.  Baseline: bil 153 Goal status: ONGOING  2.  Pt will demonstrate 160 degrees of bilateral shoulder abduction to allow her to reach out to the side. Baseline: R 38, L 71 Goal status: MET  3.  Pt will report no pain across bilateral upper quadrants to allow improved comfort.  Baseline:  Goal status: MET   4.  Pt will report no difficulty ascending/descending steps as a result of balance to decrease fall  risk.  Baseline:  Goal status: MET  5.  Pt will be independent in a home exercise program for long term stretching and strengthening.  Baseline:  Goal status: ONGOING  6.  Pt will be able to verbalize lymphedema risk reduction practices to decrease risk of lymphedema.  Baseline:  Goal status: MET   PLAN:  PT FREQUENCY: 2x/week  PT DURATION: 4 weeks  PLANNED INTERVENTIONS: 97164- PT Re-evaluation, 97110-Therapeutic exercises, 97530- Therapeutic activity, 97112- Neuromuscular re-education, 97535- Self Care, [id number]- Manual therapy, [phone]- Orthotic Fit/training, Patient/Family education, Balance training, Joint mobilization, Manual lymph drainage, Scar mobilization, Therapeutic exercises, Therapeutic activity, Neuromuscular re-education, Gait training, and Self Care  PLAN FOR NEXT SESSION: cont bil shoulder PROM/AAROM/into general strengthening; progress weights with pt as able  Hermenia Bers, PTA 06/03/2023, 10:05 AM   3 Way Raises:      Starting Position:  Leaning against wall, walk feet a few inches away from the wall and make tummy tight (tuck hips underneath you) Press back/shoulders/head against wall as much as possible. Keep thumbs up to ceiling, elbows straight and shoulders relaxed/down throughout.  1. Lift arms in front to shoulder height 2. Lift arms a little wider into a "V" to shoulder height 3. Lift arms out to sides in a "T" to shoulder height  Perform 10 times in each direction. Hold 1-2 lbs to start with and work up to 2-3 sets of 10/day. Perform 2-3 times/week. Increase weight as able, decreasing sets of 10 each time you increase weights, then slowly working your way back up to 2-3 sets each time.    Cancer [facility] [phone]

## 2023-06-03 NOTE — Therapy (Incomplete Revision)
 OUTPATIENT PHYSICAL THERAPY  UPPER EXTREMITY ONCOLOGY TREATMENT  Patient Name: Lauren Hodge MRN: [id number] DOB:December 01, 1955, 68 y.o., female Today's Date: 06/03/2023  END OF SESSION:  PT End of Session - 06/03/23 0917     Visit Number 10    Number of Visits 15    Date for PT Re-Evaluation 06/02/23    PT Start Time 0912   pt arrived late   PT Stop Time 1001    PT Time Calculation (min) 49 min    Activity Tolerance Patient tolerated treatment well    Behavior During Therapy WFL for tasks assessed/performed              Past Medical History:  Diagnosis Date   Breast mass    Carpal tunnel syndrome    Family history of prostate cancer    Fracture of carpal bone 2018   bilateral   GERD (gastroesophageal reflux disease)    Hemorrhoids    Hiatal hernia 02/2019   seen on EGD    History of left breast biopsy    Benign   HTN (hypertension)    Hyperlipidemia    Insomnia    Osteoporosis    Osteoporosis    Vitamin D deficiency    Past Surgical History:  Procedure Laterality Date   BIOPSY THYROID     BREAST BIOPSY Left 2010   x2, 2010-2012 (no cancer)   BREAST BIOPSY Right 01/10/2023   [place] RT BREAST BX W LOC DEV 1ST LESION IMG BX SPEC US GUIDE 01/10/2023 [facility]   BREAST BIOPSY Right 01/10/2023   MM RT BREAST BX W LOC DEV 1ST LESION IMAGE BX SPEC STEREO GUIDE 01/10/2023 [facility]   BREAST BIOPSY Right 01/22/2023   MM RT BREAST BX W LOC DEV 1ST LESION IMAGE BX SPEC STEREO GUIDE 01/22/2023 [facility]   BREAST BIOPSY Right 01/22/2023   MM RT BREAST BX W LOC DEV EA AD LESION IMG BX SPEC STEREO GUIDE 01/22/2023 [facility]   BREAST BIOPSY Left 01/22/2023   MM LT BREAST BX W LOC DEV 1ST LESION IMAGE BX SPEC STEREO GUIDE 01/22/2023 [facility]   BREAST EXCISIONAL BIOPSY Left    CHOLECYSTECTOMY  2015   HEMORRHOIDECTOMY WITH HEMORRHOID BANDING  04/22/2017   SIMPLE MASTECTOMY WITH AXILLARY SENTINEL NODE BIOPSY Bilateral 03/05/2023    Procedure: BILATERAL MASTECTOMIES WITH BILATERAL MAGTRACE INJECTIONS;  Surgeon: Almond Lint, MD;  Location: [facility];  Service: General;  Laterality: Bilateral;  PEC BLOCK   Patient Active Problem List   Diagnosis Date Noted   Bilateral breast cancer (HCC) 03/05/2023   Family history of prostate cancer    Genetic testing 02/07/2023   Ductal carcinoma in situ (DCIS) of right breast 01/21/2023   Malignant neoplasm of central portion of female breast (HCC) 01/14/2023   Trigger finger, right middle finger 09/11/2021   Annual physical exam 10/31/2020   PCP NOTES >>>>>>>>>>>>>>>>>> 08/26/2020   GERD (gastroesophageal reflux disease) 07/26/2020   Hyperlipidemia 10/06/2019   Vitamin D deficiency 06/18/2018   Hypertensive disorder 05/18/2018    PCP: Willow Ora, MD  REFERRING PROVIDER: Almond Lint, MD  REFERRING DIAG:  C50.111,Z17.0 (ICD-10-CM) - Malignant neoplasm of central portion of right breast in female, estrogen receptor positive (HCC) C50.112,Z17.0 (ICD-10-CM) - Malignant neoplasm of central portion of left breast in female, estrogen receptor positive (HCC)  THERAPY DIAG:  Stiffness of right shoulder, not elsewhere classified  Stiffness of left shoulder, not elsewhere classified  Abnormal posture  Aftercare following surgery for neoplasm  Malignant neoplasm of central portion of right breast in female, estrogen receptor positive (HCC)  Malignant neoplasm of central portion of left breast in female, estrogen receptor positive (HCC)  ONSET DATE: 03/05/23  Rationale for Evaluation and Treatment: Rehabilitation  SUBJECTIVE:                                                                                                                                                                                           SUBJECTIVE STATEMENT:  I am feeling good today, I feel better after I stretch. My Rt arm hasn't been feeling as tired.    PERTINENT HISTORY: 03/05/23:  bilateral mastectomies and R SLNB,on L breast cancer: DCIS, grade 2, greatest dimension: 2.5 mm, ER: 95%, positive, PR: 60%. R breast cancer: DCIS grade 2, greatest dimension 7 mm, ER: 100%, positive, PR: 85%, positive, SLNB 0/4 on R, high blood pressure  PAIN:  Are you having pain? No  PRECAUTIONS: osteoporosis  RED FLAGS: None   WEIGHT BEARING RESTRICTIONS: No  FALLS:  Has patient fallen in last 6 months? No  LIVING ENVIRONMENT: Lives with: lives with their family - mom, husband, and daughter Lives in: House/apartment Has following equipment at home: None  OCCUPATION: retired  LEISURE: before the surgery exercised in my home a little - walked, did UE and LE exercises, 2x/wk  HAND DOMINANCE: right   PRIOR LEVEL OF FUNCTION: Independent  PATIENT GOALS: to be able to do all the things I did before - to cook, clean house, hand crafts   OBJECTIVE: Note: Objective measures were completed at Evaluation unless otherwise noted.  COGNITION: Overall cognitive status: Within functional limits for tasks assessed   OBSERVATIONS / OTHER ASSESSMENTS: healing mastectomy scars with steri strips intact, gauze over bilateral drain sites due to still draining   POSTURE: forward head, rounded shoulders  UPPER EXTREMITY AROM/PROM:  A/PROM RIGHT   eval  04/14/23 05/05/23 05/27/23  Shoulder extension 45 45 50   Shoulder flexion 73 137 140 150  Shoulder abduction 38 100 135 170  Shoulder internal rotation unable     Shoulder external rotation unable 80 85     (Blank rows = not tested)  A/PROM LEFT   eval 04/14/23 05/05/23 05/27/23  Shoulder extension 50 45 50   Shoulder flexion 98 135 140 152  Shoulder abduction 71 125 140 170  Shoulder internal rotation unable     Shoulder external rotation unable 80 85     (Blank rows = not tested)   UPPER EXTREMITY STRENGTH:  Bil 5/5 without pain tested 05/05/23  LYMPHEDEMA ASSESSMENTS:  SURGERY TYPE/DATE: 03/05/23- Bilateral mastectomies for  bilateral breast  cancer NUMBER OF LYMPH NODES REMOVED: R 0/4 CHEMOTHERAPY: none RADIATION:none HORMONE TREATMENT: none currently INFECTIONS: none  LYMPHEDEMA ASSESSMENTS:   LANDMARK RIGHT  eval  At axilla  35.5  15 cm proximal to olecranon process 30.5  10 cm proximal to olecranon process 28.9  Olecranon process 25.5  15 cm proximal to ulnar styloid process 24.5  10 cm proximal to ulnar styloid process 22.1  Just proximal to ulnar styloid process 17  Across hand at thumb web space 18.9  At base of 2nd digit 6.5  (Blank rows = not tested)  LANDMARK LEFT  eval  At axilla  34.5  15 cm proximal to olecranon process 30.5  10 cm proximal to olecranon process 29  Olecranon process 25.2  15 cm proximal to ulnar styloid process 24.5  10 cm proximal to ulnar styloid process 22.2  Just proximal to ulnar styloid process 17  Across hand at thumb web space 18.5  At base of 2nd digit 6.2  (Blank rows = not tested)      TODAY'S TREATMENT:                                                                                                                                          DATE:  06/03/23: Therapeutic Exercises   Roll yellow ball up wall into flex x 10 and then bil abd x 10 each Pulleys into flexion and abd x 2 mins with VC's to remind pt to decrease scapular compensation/relax shoulders throughout Modified downward dog on wall x 5 reps, 5 sec holds Bil UE 3 way raises with 2# standing with back against wall into flex, scaption and then 1# abd returning therapist demo x 10 each, pt was challenged by increased weights and VC's for correct technique with back against wall/core engaged [employer] for scapular retraction and then bil UE ext x 10 each, demo and tactile cues for correct UE position and technique Doorway stretch x 3 reps, 20 sec Manual Therapy  P/ROM of Rt shoulder into flex, abd, and D2 with scapular depression by therapist throughout. STM to Rt pect tendons  during P/ROM  05/27/23 Discussed POC and goals x Therapeutic Exercises x  Roll yellow ball up wall into flex x 10 and then bil abd x 10 each Pulleys into flexion and abd x 2 mins with VC's to remind pt to decrease scapular compensation/relax shoulders throughout Bil UE 3 way raises with 2# standing with back against wall into flex, scaption and then 1# abd returning therapist demo x 10 each, pt was challenged by increased weights Row red x 15 Doorway stretch Manual Therapy x P/ROM of Rt shoulder into flex, abd, and D2 with scapular depression by therapist throughout. STM to Rt pect tendons during P/ROM  05/14/23: Therapeutic Exercises  Roll yellow ball up wall into flex x 10 and then bil abd x  10 each Pulleys into flexion and abd x 2 mins with VC's to remind pt to decrease scapular compensation/relax shoulders throughout Did SOZO at pts request. Explained to her that this will give [place] helpful information but won't be as accurate without a baseline. She verbalized understanding.  Bil UE 3 way raises with 2# standing with back against wall into flex, scaption and then 1# abd returning therapist demo x 10 each, pt was challenged by increased weights Manual Therapy P/ROM of bil shoulders into flex, abd, and D2 with scapular depression by therapist throughout. STM to bil pect tendons during P/ROM  05/05/23: Rechecked goals and extended POC x Therapeutic Exercises x Pulleys into flexion and abd x 2 mins Roll yellow ball up wall into flex x 10 and then bil abd x 5 each Bil UE 3 way raises with 1# standing with back against wall into flex, scaption and abd returning therapist demo x 10 each.  Row yellow x 10 Bicep curls  1# x 10 Manual Therapyx79min P/ROM of bil shoulders into flex, abd, and D2 with scapular depression by therapist throughout. STM to bil pect tendons during P/ROM being mindful of not pulling at still healing Lt incision  05/01/23: Therapeutic  Exercises Pulleys into flexion and abd x 2 mins each reminding pt to hold stretch with slower pace Roll yellow ball up wall into flex x 10 and then bil abd x 5 each Bil UE 3 way raises with 1# standing with back against wall into flex, scaption and abd returning therapist demo x 10 each. Spent time explaining why better against wall than in middle of floor for erect posture. Handout issued.  Manual Therapy P/ROM of bil shoulders into flex, abd, and D2 with scapular depression by therapist throughout. STM to bil pect tendons during P/ROM being mindful of not pulling at still healing Lt incision  04/29/23: Manual Therapy P/ROM of bil shoulders into flex, abd, and D2 with scapular depression by therapist throughout. Answered pts questions during stretching about drainage she has been seeing on her nightgown. Educated her that since she reports this has been steadily improving/reducing since surgery, and she does not demonstrate any other signs of infection that this is likely normal post op drainage as she still has one small area in the center of her incision that has yet to close and appears to be a small hole. She was educated about s/s of infection to be aware of tough if anything changes. Also educated pt that the "bumps" she feels at her chest is not a new mass and in fact are her ribs that she hasn't been able to palpate before due to having breast tissue. Pt is very nervous about cancer returning so spent time encouraging her, within scope of practice, about very small likelihood of this in general, but especially this soon after surgery. Pt verbalized good understanding of all seemed to feel better after discussion with help of interpreter.  Included some gentle MLD to Rt lateral trunk where more post op edema present: Rt inguinal nodes, and Rt axillo-inguinal anastomosis. Therapeutic Exercises Pulleys into flex and abd x 2 mins each   PATIENT EDUCATION:  Education details: Bil UE 3 way  raises Person educated: Patient with interpreter Education method: Explanation and Handouts, demonstration, tactile cues Education comprehension: verbalized understanding, returned demonstration, and will benefit from further review  HOME EXERCISE PROGRAM: Post op breast exercises beginning 1 week after last drain removed Bil UE 3 way raises   ASSESSMENT:  CLINICAL IMPRESSION: Renewal done this session to cont 2x/wj x 4 weeks. Continued with progressing postural strength and bil UE flexibility. Pt reports feeling good stretches with these and can tell overall her Rt UE fatigue has improved. Her Rt sholder end P/ROM is still some limited by fascial restrictions but this is softening at Rt pect and lateral trunk from when this therapist last saw her.   OBJECTIVE IMPAIRMENTS: decreased balance, decreased knowledge of condition, decreased knowledge of use of DME, decreased ROM, decreased strength, increased fascial restrictions, impaired UE functional use, postural dysfunction, and pain.   ACTIVITY LIMITATIONS: carrying, lifting, stairs, bathing, dressing, and reach over head  PARTICIPATION LIMITATIONS: meal prep, cleaning, laundry, community activity, and yard work  PERSONAL FACTORS:  none  are also affecting patient's functional outcome.   REHAB POTENTIAL: Good  CLINICAL DECISION MAKING: Stable/uncomplicated  EVALUATION COMPLEXITY: Low  GOALS: Goals reviewed with patient? Yes  SHORT TERM GOALS=LONG TERM GOALS Target date: 06/17/23  Pt will demonstrate 160 degrees of bilateral shoulder flexion to allow her to reach overhead.  Baseline: bil 153 Goal status: ONGOING  2.  Pt will demonstrate 160 degrees of bilateral shoulder abduction to allow her to reach out to the side. Baseline: R 38, L 71 Goal status: MET  3.  Pt will report no pain across bilateral upper quadrants to allow improved comfort.  Baseline:  Goal status: MET   4.  Pt will report no difficulty  ascending/descending steps as a result of balance to decrease fall risk.  Baseline:  Goal status: MET  5.  Pt will be independent in a home exercise program for long term stretching and strengthening.  Baseline:  Goal status: ONGOING  6.  Pt will be able to verbalize lymphedema risk reduction practices to decrease risk of lymphedema.  Baseline:  Goal status: MET   PLAN:  PT FREQUENCY: 2x/week  PT DURATION: 4 weeks  PLANNED INTERVENTIONS: 97164- PT Re-evaluation, 97110-Therapeutic exercises, 97530- Therapeutic activity, 97112- Neuromuscular re-education, 97535- Self Care, 16109- Manual therapy, [phone]- Orthotic Fit/training, Patient/Family education, Balance training, Joint mobilization, Manual lymph drainage, Scar mobilization, Therapeutic exercises, Therapeutic activity, Neuromuscular re-education, Gait training, and Self Care  PLAN FOR NEXT SESSION: Renewal done this session; cont bil shoulder PROM/AAROM/into general strengthening; progress weights with pt as able  Hermenia Bers, PTA 06/03/2023, 10:05 AM   3 Way Raises:      Starting Position:  Leaning against wall, walk feet a few inches away from the wall and make tummy tight (tuck hips underneath you) Press back/shoulders/head against wall as much as possible. Keep thumbs up to ceiling, elbows straight and shoulders relaxed/down throughout.  1. Lift arms in front to shoulder height 2. Lift arms a little wider into a "V" to shoulder height 3. Lift arms out to sides in a "T" to shoulder height  Perform 10 times in each direction. Hold 1-2 lbs to start with and work up to 2-3 sets of 10/day. Perform 2-3 times/week. Increase weight as able, decreasing sets of 10 each time you increase weights, then slowly working your way back up to 2-3 sets each time.    Cancer Rehab [phone]

## 2023-06-04 ENCOUNTER — Other Ambulatory Visit: Payer: Self-pay

## 2023-06-04 ENCOUNTER — Other Ambulatory Visit (HOSPITAL_BASED_OUTPATIENT_CLINIC_OR_DEPARTMENT_OTHER): Payer: Self-pay

## 2023-06-06 ENCOUNTER — Ambulatory Visit: Payer: Medicare (Managed Care)

## 2023-06-06 DIAGNOSIS — Z483 Aftercare following surgery for neoplasm: Secondary | ICD-10-CM

## 2023-06-06 DIAGNOSIS — R293 Abnormal posture: Secondary | ICD-10-CM

## 2023-06-06 DIAGNOSIS — C50111 Malignant neoplasm of central portion of right female breast: Secondary | ICD-10-CM

## 2023-06-06 DIAGNOSIS — M25611 Stiffness of right shoulder, not elsewhere classified: Secondary | ICD-10-CM | POA: Diagnosis not present

## 2023-06-06 DIAGNOSIS — M25612 Stiffness of left shoulder, not elsewhere classified: Secondary | ICD-10-CM

## 2023-06-06 DIAGNOSIS — C50112 Malignant neoplasm of central portion of left female breast: Secondary | ICD-10-CM

## 2023-06-06 NOTE — Therapy (Signed)
OUTPATIENT PHYSICAL THERAPY  UPPER EXTREMITY ONCOLOGY TREATMENT  Patient Name: Lauren Hodge MRN: [id number] DOB:1955/05/17, 68 y.o., female Today's Date: 06/06/2023  END OF SESSION:  PT End of Session - 06/06/23 0941     Visit Number 11    Number of Visits 23    Date for PT Re-Evaluation 07/01/23    Authorization Type none needed    PT Start Time 0859    PT Stop Time 0937   pt wanted to end early today   PT Time Calculation (min) 38 min    Activity Tolerance Patient tolerated treatment well    Behavior During Therapy WFL for tasks assessed/performed              Past Medical History:  Diagnosis Date   Breast mass    Carpal tunnel syndrome    Family history of prostate cancer    Fracture of carpal bone 2018   bilateral   GERD (gastroesophageal reflux disease)    Hemorrhoids    Hiatal hernia 02/2019   seen on EGD    History of left breast biopsy    Benign   HTN (hypertension)    Hyperlipidemia    Insomnia    Osteoporosis    Osteoporosis    Vitamin D deficiency    Past Surgical History:  Procedure Laterality Date   BIOPSY THYROID     BREAST BIOPSY Left 2010   x2, 2010-2012 (no cancer)   BREAST BIOPSY Right 01/10/2023   [place] RT BREAST BX W LOC DEV 1ST LESION IMG BX SPEC US GUIDE 01/10/2023 [facility]   BREAST BIOPSY Right 01/10/2023   MM RT BREAST BX W LOC DEV 1ST LESION IMAGE BX SPEC STEREO GUIDE 01/10/2023 [facility]   BREAST BIOPSY Right 01/22/2023   MM RT BREAST BX W LOC DEV 1ST LESION IMAGE BX SPEC STEREO GUIDE 01/22/2023 [facility]   BREAST BIOPSY Right 01/22/2023   MM RT BREAST BX W LOC DEV EA AD LESION IMG BX SPEC STEREO GUIDE 01/22/2023 [facility]   BREAST BIOPSY Left 01/22/2023   MM LT BREAST BX W LOC DEV 1ST LESION IMAGE BX SPEC STEREO GUIDE 01/22/2023 [facility]   BREAST EXCISIONAL BIOPSY Left    CHOLECYSTECTOMY  2015   HEMORRHOIDECTOMY WITH HEMORRHOID BANDING  04/22/2017   SIMPLE MASTECTOMY WITH AXILLARY  SENTINEL NODE BIOPSY Bilateral 03/05/2023   Procedure: BILATERAL MASTECTOMIES WITH BILATERAL MAGTRACE INJECTIONS;  Surgeon: Almond Lint, MD;  Location: [facility];  Service: General;  Laterality: Bilateral;  PEC BLOCK   Patient Active Problem List   Diagnosis Date Noted   Bilateral breast cancer (HCC) 03/05/2023   Family history of prostate cancer    Genetic testing 02/07/2023   Ductal carcinoma in situ (DCIS) of right breast 01/21/2023   Malignant neoplasm of central portion of female breast (HCC) 01/14/2023   Trigger finger, right middle finger 09/11/2021   Annual physical exam 10/31/2020   PCP NOTES >>>>>>>>>>>>>>>>>> 08/26/2020   GERD (gastroesophageal reflux disease) 07/26/2020   Hyperlipidemia 10/06/2019   Vitamin D deficiency 06/18/2018   Hypertensive disorder 05/18/2018    PCP: Willow Ora, MD  REFERRING PROVIDER: Almond Lint, MD  REFERRING DIAG:  C50.111,Z17.0 (ICD-10-CM) - Malignant neoplasm of central portion of right breast in female, estrogen receptor positive (HCC) C50.112,Z17.0 (ICD-10-CM) - Malignant neoplasm of central portion of left breast in female, estrogen receptor positive (HCC)  THERAPY DIAG:  Stiffness of right shoulder, not elsewhere classified  Stiffness of left shoulder, not elsewhere  classified  Abnormal posture  Aftercare following surgery for neoplasm  Malignant neoplasm of central portion of right breast in female, estrogen receptor positive (HCC)  Malignant neoplasm of central portion of left breast in female, estrogen receptor positive (HCC)  ONSET DATE: 03/05/23  Rationale for Evaluation and Treatment: Rehabilitation  SUBJECTIVE:                                                                                                                                                                                           SUBJECTIVE STATEMENT:  I want to leave early today so I can take my husband to breakfast for Valentines day  before he has to go to work later. My Rt shoulder is feeling much better, I want to focus on my Lt shoulder now because that is what feels the tightest. I think the 2# was too much for my Rt arm last time. My Rt forearm was really sore after that session. 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   PERTINENT HISTORY: 03/05/23: bilateral mastectomies and R SLNB,on L breast cancer: DCIS, grade 2, greatest dimension: 2.5 mm, ER: 95%, positive, PR: 60%. R breast cancer: DCIS grade 2, greatest dimension 7 mm, ER: 100%, positive, PR: 85%, positive, SLNB 0/4 on R, high blood pressure  PAIN:  Are you having pain? No  PRECAUTIONS: osteoporosis  RED FLAGS: None   WEIGHT BEARING RESTRICTIONS: No  FALLS:  Has patient fallen in last 6 months? No  LIVING ENVIRONMENT: Lives with: lives with their family - mom, husband, and daughter Lives in: House/apartment Has following equipment at home: None  OCCUPATION: retired  LEISURE: before the surgery exercised in my home a little - walked, did UE and LE exercises, 2x/wk  HAND DOMINANCE: right   PRIOR LEVEL OF FUNCTION: Independent  PATIENT GOALS: to be able to do all the things I did before - to cook, clean house, hand crafts   OBJECTIVE: Note: Objective measures were completed at Evaluation unless otherwise noted.  COGNITION: Overall cognitive status: Within functional limits for tasks assessed   OBSERVATIONS / OTHER ASSESSMENTS: healing mastectomy scars with steri strips intact, gauze over bilateral drain sites due to still draining   POSTURE: forward head, rounded shoulders  UPPER EXTREMITY AROM/PROM:  A/PROM RIGHT   eval  04/14/23 05/05/23 05/27/23  Shoulder extension 45 45 50   Shoulder flexion 73 137 140 150  Shoulder abduction 38 100 135 170  Shoulder internal rotation unable     Shoulder external rotation unable 80 85     (Blank rows = not tested)  A/PROM LEFT  eval 04/14/23 05/05/23 05/27/23  Shoulder  extension 50 45 50   Shoulder flexion 98 135 140 152  Shoulder abduction 71 125 140 170  Shoulder internal rotation unable     Shoulder external rotation unable 80 85     (Blank rows = not tested)   UPPER EXTREMITY STRENGTH:  Bil 5/5 without pain tested 05/05/23  LYMPHEDEMA ASSESSMENTS:  SURGERY TYPE/DATE: 03/05/23- Bilateral mastectomies for bilateral breast cancer NUMBER OF LYMPH NODES REMOVED: R 0/4 CHEMOTHERAPY: none RADIATION:none HORMONE TREATMENT: none currently INFECTIONS: none  LYMPHEDEMA ASSESSMENTS:   LANDMARK RIGHT  eval  At axilla  35.5  15 cm proximal to olecranon process 30.5  10 cm proximal to olecranon process 28.9  Olecranon process 25.5  15 cm proximal to ulnar styloid process 24.5  10 cm proximal to ulnar styloid process 22.1  Just proximal to ulnar styloid process 17  Across hand at thumb web space 18.9  At base of 2nd digit 6.5  (Blank rows = not tested)  LANDMARK LEFT  eval  At axilla  34.5  15 cm proximal to olecranon process 30.5  10 cm proximal to olecranon process 29  Olecranon process 25.2  15 cm proximal to ulnar styloid process 24.5  10 cm proximal to ulnar styloid process 22.2  Just proximal to ulnar styloid process 17  Across hand at thumb web space 18.5  At base of 2nd digit 6.2  (Blank rows = not tested)      TODAY'S TREATMENT:                                                                                                                                          DATE:  06/06/23: Manual Therapy P/ROM of Lt shoulder into flex, abd, and D2 with scapular depression by therapist throughout. STM to Lt pect tendon insertion during P/ROM Self Care Pt had some questions about reconstruction options vs having her incision fixed (dog ears smoothed out) so PTA answered these questions within scope of practice and suggested that if pt has more questions to make a consult appt with plastic surgeon for more details, which she'll have to do   any ways if she wants her mastectomy incisions smoothed out later.   06/03/23: Therapeutic Exercises   Roll yellow ball up wall into flex x 10 and then bil abd x 10 each Pulleys into flexion and abd x 2 mins with VC's to remind pt to decrease scapular compensation/relax shoulders throughout Modified downward dog on wall x 5 reps, 5 sec holds Bil UE 3 way raises with 2# standing with back against wall into flex, scaption and then 1# abd returning therapist demo x 10 each, pt was challenged by increased weights and VC's for correct technique with back against wall/core engaged [employer] for scapular retraction and then bil UE ext x 10 each, demo and tactile cues for correct UE  position and technique Doorway stretch x 3 reps, 20 sec Manual Therapy  P/ROM of Rt shoulder into flex, abd, and D2 with scapular depression by therapist throughout. STM to Rt pect tendons during P/ROM  05/27/23 Discussed POC and goals x Therapeutic Exercises x  Roll yellow ball up wall into flex x 10 and then bil abd x 10 each Pulleys into flexion and abd x 2 mins with VC's to remind pt to decrease scapular compensation/relax shoulders throughout Bil UE 3 way raises with 2# standing with back against wall into flex, scaption and then 1# abd returning therapist demo x 10 each, pt was challenged by increased weights Row red x 15 Doorway stretch Manual Therapy x P/ROM of Rt shoulder into flex, abd, and D2 with scapular depression by therapist throughout. STM to Rt pect tendons during P/ROM  05/14/23: Therapeutic Exercises  Roll yellow ball up wall into flex x 10 and then bil abd x 10 each Pulleys into flexion and abd x 2 mins with VC's to remind pt to decrease scapular compensation/relax shoulders throughout Did SOZO at pts request. Explained to her that this will give [place] helpful information but won't be as accurate without a baseline. She verbalized understanding.  Bil UE 3 way raises with 2#  standing with back against wall into flex, scaption and then 1# abd returning therapist demo x 10 each, pt was challenged by increased weights Manual Therapy P/ROM of bil shoulders into flex, abd, and D2 with scapular depression by therapist throughout. STM to bil pect tendons during P/ROM  05/05/23: Rechecked goals and extended POC x Therapeutic Exercises x Pulleys into flexion and abd x 2 mins Roll yellow ball up wall into flex x 10 and then bil abd x 5 each Bil UE 3 way raises with 1# standing with back against wall into flex, scaption and abd returning therapist demo x 10 each.  Row yellow x 10 Bicep curls  1# x 10 Manual Therapyx39min P/ROM of bil shoulders into flex, abd, and D2 with scapular depression by therapist throughout. STM to bil pect tendons during P/ROM being mindful of not pulling at still healing Lt incision     PATIENT EDUCATION:  Education details: Bil UE 3 way raises Person educated: Patient with interpreter Education method: Explanation and Handouts, demonstration, tactile cues Education comprehension: verbalized understanding, returned demonstration, and will benefit from further review  HOME EXERCISE PROGRAM: Post op breast exercises beginning 1 week after last drain removed Bil UE 3 way raises   ASSESSMENT:  CLINICAL IMPRESSION: Pt requested ending session early today and wanted focused stretching to her Lt shoulder as this is her biggest c/o at this time. 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   OBJECTIVE IMPAIRMENTS: decreased balance, decreased knowledge of condition, decreased knowledge of use of DME, decreased ROM, decreased strength, increased fascial restrictions, impaired UE functional use, postural dysfunction, and pain.   ACTIVITY LIMITATIONS: carrying, lifting, stairs, bathing, dressing, and reach  over head  PARTICIPATION LIMITATIONS: meal prep, cleaning, laundry, community activity, and yard work  PERSONAL FACTORS:  none  are also affecting patient's functional outcome.   REHAB POTENTIAL: Good  CLINICAL DECISION MAKING: Stable/uncomplicated  EVALUATION COMPLEXITY: Low  GOALS: Goals reviewed with patient? Yes  SHORT TERM GOALS=LONG TERM GOALS Target date: 06/17/23  Pt will demonstrate 160 degrees of bilateral shoulder  flexion to allow her to reach overhead.  Baseline: Rt 73 and Lt 98 degrees Goal status: ONGOING - as of 05/27/23 - Rt 150 and Lt 152 degrees  2.  Pt will demonstrate 160 degrees of bilateral shoulder abduction to allow her to reach out to the side. Baseline: R 38, L 71 Goal status: MET  3.  Pt will report no pain across bilateral upper quadrants to allow improved comfort.  Baseline:  Goal status: MET   4.  Pt will report no difficulty ascending/descending steps as a result of balance to decrease fall risk.  Baseline:  Goal status: MET  5.  Pt will be independent in a home exercise program for long term stretching and strengthening.  Baseline:  Goal status: PARTIALLY MET - 06/03/23: continue to progress this as pt is able  6.  Pt will be able to verbalize lymphedema risk reduction practices to decrease risk of lymphedema.  Baseline:  Goal status: MET   PLAN:  PT FREQUENCY: 2x/week  PT DURATION: 4 weeks  PLANNED INTERVENTIONS: 97164- PT Re-evaluation, 97110-Therapeutic exercises, 97530- Therapeutic activity, 97112- Neuromuscular re-education, 97535- Self Care, [id number]- Manual therapy, [phone]- Orthotic Fit/training, Patient/Family education, Balance training, Joint mobilization, Manual lymph drainage, Scar mobilization, Therapeutic exercises, Therapeutic activity, Neuromuscular re-education, Gait training, and Self Care  PLAN FOR NEXT SESSION: cont bil shoulder PROM/AAROM/into general strengthening; progress weights with pt as able  Hermenia Bers, PTA 06/06/2023, 9:57 AM   3 Way Raises:      Starting Position:  Leaning against wall, walk feet a few inches away from the wall and make tummy tight (tuck hips underneath you) Press back/shoulders/head against wall as much as possible. Keep thumbs up to ceiling, elbows straight and shoulders relaxed/down throughout.  1. Lift arms in front to shoulder height 2. Lift arms a little wider into a "V" to shoulder height 3. Lift arms out to sides in a "T" to shoulder height  Perform 10 times in each direction. Hold 1-2 lbs to start with and work up to 2-3 sets of 10/day. Perform 2-3 times/week. Increase weight as able, decreasing sets of 10 each time you increase weights, then slowly working your way back up to 2-3 sets each time.    Cancer Rehab [phone]

## 2023-06-06 NOTE — Therapy (Addendum)
 OUTPATIENT PHYSICAL THERAPY  UPPER EXTREMITY ONCOLOGY TREATMENT  Patient Name: Lauren Hodge MRN: [id number] DOB:[birth year], 68 y.o., female Today's Date: 06/09/2023  END OF SESSION:     Past Medical History:  Diagnosis Date   Breast mass    Carpal tunnel syndrome    Family history of prostate cancer    Fracture of carpal bone 2018   bilateral   GERD (gastroesophageal reflux disease)    Hemorrhoids    Hiatal hernia 02/2019   seen on EGD    History of left breast biopsy    Benign   HTN (hypertension)    Hyperlipidemia    Insomnia    Osteoporosis    Osteoporosis    Vitamin D deficiency    Past Surgical History:  Procedure Laterality Date   BIOPSY THYROID     BREAST BIOPSY Left 2010   x2, 2010-2012 (no cancer)   BREAST BIOPSY Right 01/10/2023   [place] RT BREAST BX W LOC DEV 1ST LESION IMG BX SPEC US GUIDE 01/10/2023 [facility]   BREAST BIOPSY Right 01/10/2023   MM RT BREAST BX W LOC DEV 1ST LESION IMAGE BX SPEC STEREO GUIDE 01/10/2023 [facility]   BREAST BIOPSY Right 01/22/2023   MM RT BREAST BX W LOC DEV 1ST LESION IMAGE BX SPEC STEREO GUIDE 01/22/2023 [facility]   BREAST BIOPSY Right 01/22/2023   MM RT BREAST BX W LOC DEV EA AD LESION IMG BX SPEC STEREO GUIDE 01/22/2023 [facility]   BREAST BIOPSY Left 01/22/2023   MM LT BREAST BX W LOC DEV 1ST LESION IMAGE BX SPEC STEREO GUIDE 01/22/2023 [facility]   BREAST EXCISIONAL BIOPSY Left    CHOLECYSTECTOMY  2015   HEMORRHOIDECTOMY WITH HEMORRHOID BANDING  04/22/2017   SIMPLE MASTECTOMY WITH AXILLARY SENTINEL NODE BIOPSY Bilateral 03/05/2023   Procedure: BILATERAL MASTECTOMIES WITH BILATERAL MAGTRACE INJECTIONS;  Surgeon: Almond Lint, MD;  Location: [facility];  Service: General;  Laterality: Bilateral;  PEC BLOCK   Patient Active Problem List   Diagnosis Date Noted   Bilateral breast cancer (HCC) 03/05/2023   Family history of prostate cancer    Genetic testing  02/07/2023   Ductal carcinoma in situ (DCIS) of right breast 01/21/2023   Malignant neoplasm of central portion of female breast (HCC) 01/14/2023   Trigger finger, right middle finger 09/11/2021   Annual physical exam 10/31/2020   PCP NOTES >>>>>>>>>>>>>>>>>> 08/26/2020   GERD (gastroesophageal reflux disease) 07/26/2020   Hyperlipidemia 10/06/2019   Vitamin D deficiency 06/18/2018   Hypertensive disorder 05/18/2018    PCP: Willow Ora, MD  REFERRING PROVIDER: Almond Lint, MD  REFERRING DIAG:  C50.111,Z17.0 (ICD-10-CM) - Malignant neoplasm of central portion of right breast in female, estrogen receptor positive (HCC) C50.112,Z17.0 (ICD-10-CM) - Malignant neoplasm of central portion of left breast in female, estrogen receptor positive (HCC)  THERAPY DIAG:  Stiffness of right shoulder, not elsewhere classified  Stiffness of left shoulder, not elsewhere classified  Abnormal posture  Aftercare following surgery for neoplasm  Malignant neoplasm of central portion of right breast in female, estrogen receptor positive (HCC)  Malignant neoplasm of central portion of left breast in female, estrogen receptor positive (HCC)  ONSET DATE: 03/05/23  Rationale for Evaluation and Treatment: Rehabilitation  SUBJECTIVE:  SUBJECTIVE STATEMENT:  I am feeling good today, I feel better after I stretch. My Rt arm hasn't been feeling as tired.    PERTINENT HISTORY: 03/05/23: bilateral mastectomies and R SLNB,on L breast cancer: DCIS, grade 2, greatest dimension: 2.5 mm, ER: 95%, positive, PR: 60%. R breast cancer: DCIS grade 2, greatest dimension 7 mm, ER: 100%, positive, PR: 85%, positive, SLNB 0/4 on R, high blood pressure  PAIN:  Are you having pain? No  PRECAUTIONS: osteoporosis  RED FLAGS: None   WEIGHT  BEARING RESTRICTIONS: No  FALLS:  Has patient fallen in last 6 months? No  LIVING ENVIRONMENT: Lives with: lives with their family - mom, husband, and daughter Lives in: House/apartment Has following equipment at home: None  OCCUPATION: retired  LEISURE: before the surgery exercised in my home a little - walked, did UE and LE exercises, 2x/wk  HAND DOMINANCE: right   PRIOR LEVEL OF FUNCTION: Independent  PATIENT GOALS: to be able to do all the things I did before - to cook, clean house, hand crafts   OBJECTIVE: Note: Objective measures were completed at Evaluation unless otherwise noted.  COGNITION: Overall cognitive status: Within functional limits for tasks assessed   OBSERVATIONS / OTHER ASSESSMENTS: healing mastectomy scars with steri strips intact, gauze over bilateral drain sites due to still draining   POSTURE: forward head, rounded shoulders  UPPER EXTREMITY AROM/PROM:  A/PROM RIGHT   eval  04/14/23 05/05/23 05/27/23  Shoulder extension 45 45 50   Shoulder flexion 73 137 140 150  Shoulder abduction 38 100 135 170  Shoulder internal rotation unable     Shoulder external rotation unable 80 85     (Blank rows = not tested)  A/PROM LEFT   eval 04/14/23 05/05/23 05/27/23  Shoulder extension 50 45 50   Shoulder flexion 98 135 140 152  Shoulder abduction 71 125 140 170  Shoulder internal rotation unable     Shoulder external rotation unable 80 85     (Blank rows = not tested)   UPPER EXTREMITY STRENGTH:  Bil 5/5 without pain tested 05/05/23  LYMPHEDEMA ASSESSMENTS:  SURGERY TYPE/DATE: 03/05/23- Bilateral mastectomies for bilateral breast cancer NUMBER OF LYMPH NODES REMOVED: R 0/4 CHEMOTHERAPY: none RADIATION:none HORMONE TREATMENT: none currently INFECTIONS: none  LYMPHEDEMA ASSESSMENTS:   LANDMARK RIGHT  eval  At axilla  35.5  15 cm proximal to olecranon process 30.5  10 cm proximal to olecranon process 28.9  Olecranon process 25.5  15 cm proximal  to ulnar styloid process 24.5  10 cm proximal to ulnar styloid process 22.1  Just proximal to ulnar styloid process 17  Across hand at thumb web space 18.9  At base of 2nd digit 6.5  (Blank rows = not tested)  LANDMARK LEFT  eval  At axilla  34.5  15 cm proximal to olecranon process 30.5  10 cm proximal to olecranon process 29  Olecranon process 25.2  15 cm proximal to ulnar styloid process 24.5  10 cm proximal to ulnar styloid process 22.2  Just proximal to ulnar styloid process 17  Across hand at thumb web space 18.5  At base of 2nd digit 6.2  (Blank rows = not tested)      TODAY'S TREATMENT:  DATE:  06/03/23: Therapeutic Exercises   Roll yellow ball up wall into flex x 10 and then bil abd x 10 each Pulleys into flexion and abd x 2 mins with VC's to remind pt to decrease scapular compensation/relax shoulders throughout Modified downward dog on wall x 5 reps, 5 sec holds Bil UE 3 way raises with 2# standing with back against wall into flex, scaption and then 1# abd returning therapist demo x 10 each, pt was challenged by increased weights and VC's for correct technique with back against wall/core engaged [employer] for scapular retraction and then bil UE ext x 10 each, demo and tactile cues for correct UE position and technique Doorway stretch x 3 reps, 20 sec Manual Therapy  P/ROM of Rt shoulder into flex, abd, and D2 with scapular depression by therapist throughout. STM to Rt pect tendons during P/ROM  05/27/23 Discussed POC and goals x Therapeutic Exercises x  Roll yellow ball up wall into flex x 10 and then bil abd x 10 each Pulleys into flexion and abd x 2 mins with VC's to remind pt to decrease scapular compensation/relax shoulders throughout Bil UE 3 way raises with 2# standing with back against wall into flex,  scaption and then 1# abd returning therapist demo x 10 each, pt was challenged by increased weights Row red x 15 Doorway stretch Manual Therapy x P/ROM of Rt shoulder into flex, abd, and D2 with scapular depression by therapist throughout. STM to Rt pect tendons during P/ROM  05/14/23: Therapeutic Exercises  Roll yellow ball up wall into flex x 10 and then bil abd x 10 each Pulleys into flexion and abd x 2 mins with VC's to remind pt to decrease scapular compensation/relax shoulders throughout Did SOZO at pts request. Explained to her that this will give [place] helpful information but won't be as accurate without a baseline. She verbalized understanding.  Bil UE 3 way raises with 2# standing with back against wall into flex, scaption and then 1# abd returning therapist demo x 10 each, pt was challenged by increased weights Manual Therapy P/ROM of bil shoulders into flex, abd, and D2 with scapular depression by therapist throughout. STM to bil pect tendons during P/ROM  05/05/23: Rechecked goals and extended POC x Therapeutic Exercises x Pulleys into flexion and abd x 2 mins Roll yellow ball up wall into flex x 10 and then bil abd x 5 each Bil UE 3 way raises with 1# standing with back against wall into flex, scaption and abd returning therapist demo x 10 each.  Row yellow x 10 Bicep curls  1# x 10 Manual Therapyx50min P/ROM of bil shoulders into flex, abd, and D2 with scapular depression by therapist throughout. STM to bil pect tendons during P/ROM being mindful of not pulling at still healing Lt incision  05/01/23: Therapeutic Exercises Pulleys into flexion and abd x 2 mins each reminding pt to hold stretch with slower pace Roll yellow ball up wall into flex x 10 and then bil abd x 5 each Bil UE 3 way raises with 1# standing with back against wall into flex, scaption and abd returning therapist demo x 10 each. Spent time explaining why better against wall than in middle of  floor for erect posture. Handout issued.  Manual Therapy P/ROM of bil shoulders into flex, abd, and D2 with scapular depression by therapist throughout. STM to bil pect tendons during P/ROM being mindful of not pulling at still healing  Lt incision  04/29/23: Manual Therapy P/ROM of bil shoulders into flex, abd, and D2 with scapular depression by therapist throughout. Answered pts questions during stretching about drainage she has been seeing on her nightgown. Educated her that since she reports this has been steadily improving/reducing since surgery, and she does not demonstrate any other signs of infection that this is likely normal post op drainage as she still has one small area in the center of her incision that has yet to close and appears to be a small hole. She was educated about s/s of infection to be aware of tough if anything changes. Also educated pt that the "bumps" she feels at her chest is not a new mass and in fact are her ribs that she hasn't been able to palpate before due to having breast tissue. Pt is very nervous about cancer returning so spent time encouraging her, within scope of practice, about very small likelihood of this in general, but especially this soon after surgery. Pt verbalized good understanding of all seemed to feel better after discussion with help of interpreter.  Included some gentle MLD to Rt lateral trunk where more post op edema present: Rt inguinal nodes, and Rt axillo-inguinal anastomosis. Therapeutic Exercises Pulleys into flex and abd x 2 mins each   PATIENT EDUCATION:  Education details: Bil UE 3 way raises Person educated: Patient with interpreter Education method: Explanation and Handouts, demonstration, tactile cues Education comprehension: verbalized understanding, returned demonstration, and will benefit from further review  HOME EXERCISE PROGRAM: Post op breast exercises beginning 1 week after last drain removed Bil UE 3 way raises    ASSESSMENT:  CLINICAL IMPRESSION: Renewal done this session to cont towards unmet goals. Progress noted with A/ROM as she has met abd goal, and she has made great progress towards abd goal. Also still progressing her HEP goal as her A/ROM improves and we are able to progress as she tolerates. Continued with progressing postural strength and bil UE flexibility. Pt reports feeling good stretches with these and can tell overall her Rt UE fatigue has improved. Her Rt sholder end P/ROM is still some limited by fascial restrictions but this is softening at Rt pect and lateral trunk from when this therapist last saw her.   OBJECTIVE IMPAIRMENTS: decreased balance, decreased knowledge of condition, decreased knowledge of use of DME, decreased ROM, decreased strength, increased fascial restrictions, impaired UE functional use, postural dysfunction, and pain.   ACTIVITY LIMITATIONS: carrying, lifting, stairs, bathing, dressing, and reach over head  PARTICIPATION LIMITATIONS: meal prep, cleaning, laundry, community activity, and yard work  PERSONAL FACTORS:  none  are also affecting patient's functional outcome.   REHAB POTENTIAL: Good  CLINICAL DECISION MAKING: Stable/uncomplicated  EVALUATION COMPLEXITY: Low  GOALS: Goals reviewed with patient? Yes  SHORT TERM GOALS=LONG TERM GOALS Target date: 06/17/23  Pt will demonstrate 160 degrees of bilateral shoulder flexion to allow her to reach overhead.  Baseline:  Rt 73 and Lt 98 degrees  Goal status: ONGOING - as of 05/27/23 - Rt 150 and Lt 152 degrees   2.  Pt will demonstrate 160 degrees of bilateral shoulder abduction to allow her to reach out to the side. Baseline: R 38, L 71 Goal status: MET - bil 170 degrees  3.  Pt will report no pain across bilateral upper quadrants to allow improved comfort.  Baseline:  Goal status: MET   4.  Pt will report no difficulty ascending/descending steps as a result of balance to decrease fall risk.  Baseline:  Goal status: MET  5.  Pt will be independent in a home exercise program for long term stretching and strengthening.  Baseline:  Goal status: PARTIALLY MET - 06/03/23: continue to progress this as pt is able   6.  Pt will be able to verbalize lymphedema risk reduction practices to decrease risk of lymphedema.  Baseline:  Goal status: MET   PLAN:  PT FREQUENCY: 2x/week  PT DURATION: 4 weeks  PLANNED INTERVENTIONS: 97164- PT Re-evaluation, 97110-Therapeutic exercises, 97530- Therapeutic activity, 97112- Neuromuscular re-education, 97535- Self Care, 16109- Manual therapy, [phone]- Orthotic Fit/training, Patient/Family education, Balance training, Joint mobilization, Manual lymph drainage, Scar mobilization, Therapeutic exercises, Therapeutic activity, Neuromuscular re-education, Gait training, and Self Care  PLAN FOR NEXT SESSION: Renewal done this session for 2x/wk, x 4 weeks; cont bil shoulder PROM/AAROM/into general strengthening; progress weights with pt as able  Idamae Lusher, PT 06/09/2023, 5:52 PM   3 Way Raises:      Starting Position:  Leaning against wall, walk feet a few inches away from the wall and make tummy tight (tuck hips underneath you) Press back/shoulders/head against wall as much as possible. Keep thumbs up to ceiling, elbows straight and shoulders relaxed/down throughout.  1. Lift arms in front to shoulder height 2. Lift arms a little wider into a "V" to shoulder height 3. Lift arms out to sides in a "T" to shoulder height  Perform 10 times in each direction. Hold 1-2 lbs to start with and work up to 2-3 sets of 10/day. Perform 2-3 times/week. Increase weight as able, decreasing sets of 10 each time you increase weights, then slowly working your way back up to 2-3 sets each time.    Cancer Rehab [phone]

## 2023-06-09 ENCOUNTER — Ambulatory Visit: Payer: Medicare (Managed Care)

## 2023-06-09 NOTE — Addendum Note (Signed)
 Addended by: Idamae Lusher on: 06/09/2023 05:53 PM   Modules accepted: Orders

## 2023-06-11 ENCOUNTER — Encounter: Payer: Self-pay | Admitting: Genetic Counselor

## 2023-06-13 ENCOUNTER — Other Ambulatory Visit (HOSPITAL_BASED_OUTPATIENT_CLINIC_OR_DEPARTMENT_OTHER): Payer: Self-pay

## 2023-06-13 ENCOUNTER — Ambulatory Visit: Payer: Medicare (Managed Care) | Admitting: *Deleted

## 2023-06-13 VITALS — Ht 59.0 in | Wt 162.0 lb

## 2023-06-13 DIAGNOSIS — Z860100 Personal history of colon polyps, unspecified: Secondary | ICD-10-CM

## 2023-06-13 DIAGNOSIS — Z1501 Genetic susceptibility to malignant neoplasm of breast: Secondary | ICD-10-CM

## 2023-06-13 MED ORDER — SUTAB 1479-225-188 MG PO TABS
12.0000 | ORAL_TABLET | ORAL | 0 refills | Status: DC
  Filled 2023-06-13 – 2023-06-19 (×2): qty 12, 1d supply, fill #0

## 2023-06-13 NOTE — Progress Notes (Signed)
 Pt's name and DOB verified at the beginning of the pre-visit wit 2 identifiers  Permission given to speak with  Pt denies any difficulty with ambulating,sitting, laying down or rolling side to side  Pt has no issues with ambulation   Pt has no issues moving head neck or swallowing  No egg or soy allergy known to patient   No issues known to pt with past sedation with any surgeries or procedures  Pt denies having issues being intubated  No FH of Malignant Hyperthermia  Pt is not on diet pills or shots  Pt is not on home 02   Pt is not on blood thinners   Pt has frequent issues with constipation RN instructed pt to use Miralax per bottles instructions a week before prep days. Pt states they will  Pt is not on dialysis  Pt denise any abnormal heart rhythms   Pt denies any upcoming cardiac testing  Patient's chart reviewed by Cathlyn Parsons CNRA prior to pre-visit and patient appropriate for the [facility].  Pre-visit completed and red dot placed by patient's name on their procedure day (on provider's schedule).   Visit in person with Spanish interpretor  Pt Scale weight is 162 l b  IInstructions reviewed. Pt given , [facility] main # and MD on call # prior to instructions.  Pt states understanding of instructions. Instructed to review again prior to procedure. Pt states they will.  Pt desired meds to be sent to her pharmacy  Instructions given to pt with stressing the importance of hydration as instructed per instructions

## 2023-06-17 ENCOUNTER — Encounter: Payer: Self-pay | Admitting: Rehabilitation

## 2023-06-17 ENCOUNTER — Ambulatory Visit: Payer: Medicare (Managed Care) | Admitting: Rehabilitation

## 2023-06-17 DIAGNOSIS — Z170 Estrogen receptor positive status [ER+]: Secondary | ICD-10-CM

## 2023-06-17 DIAGNOSIS — R293 Abnormal posture: Secondary | ICD-10-CM

## 2023-06-17 DIAGNOSIS — Z483 Aftercare following surgery for neoplasm: Secondary | ICD-10-CM

## 2023-06-17 DIAGNOSIS — M25612 Stiffness of left shoulder, not elsewhere classified: Secondary | ICD-10-CM

## 2023-06-17 DIAGNOSIS — M25611 Stiffness of right shoulder, not elsewhere classified: Secondary | ICD-10-CM

## 2023-06-17 NOTE — Therapy (Signed)
 OUTPATIENT PHYSICAL THERAPY  UPPER EXTREMITY ONCOLOGY TREATMENT  Patient Name: Lauren Hodge MRN: [id number] DOB:07-27-1955, 68 y.o., female Today's Date: 06/17/2023  END OF SESSION:  PT End of Session - 06/17/23 0955     PT Start Time 0900    PT Stop Time 0955    PT Time Calculation (min) 55 min    Activity Tolerance Patient tolerated treatment well    Behavior During Therapy WFL for tasks assessed/performed              Past Medical History:  Diagnosis Date   Blood transfusion without reported diagnosis    Breast mass    Cancer (HCC)    Carpal tunnel syndrome    Family history of prostate cancer    Fracture of carpal bone 2018   bilateral   GERD (gastroesophageal reflux disease)    Hemorrhoids    Hiatal hernia 02/2019   seen on EGD    History of left breast biopsy    Benign   HTN (hypertension)    Hyperlipidemia    Kidney stones    Osteoporosis    Osteoporosis    Vitamin D deficiency    Past Surgical History:  Procedure Laterality Date   BIOPSY THYROID     BREAST BIOPSY Left 2010   x2, 2010-2012 (no cancer)   BREAST BIOPSY Right 01/10/2023   [place] RT BREAST BX W LOC DEV 1ST LESION IMG BX SPEC US GUIDE 01/10/2023 [facility]   BREAST BIOPSY Right 01/10/2023   MM RT BREAST BX W LOC DEV 1ST LESION IMAGE BX SPEC STEREO GUIDE 01/10/2023 [facility]   BREAST BIOPSY Right 01/22/2023   MM RT BREAST BX W LOC DEV 1ST LESION IMAGE BX SPEC STEREO GUIDE 01/22/2023 [facility]   BREAST BIOPSY Right 01/22/2023   MM RT BREAST BX W LOC DEV EA AD LESION IMG BX SPEC STEREO GUIDE 01/22/2023 [facility]   BREAST BIOPSY Left 01/22/2023   MM LT BREAST BX W LOC DEV 1ST LESION IMAGE BX SPEC STEREO GUIDE 01/22/2023 [facility]   BREAST EXCISIONAL BIOPSY Left    CHOLECYSTECTOMY  2015   HEMORRHOIDECTOMY WITH HEMORRHOID BANDING  04/22/2017   SIMPLE MASTECTOMY WITH AXILLARY SENTINEL NODE BIOPSY Bilateral 03/05/2023   Procedure: BILATERAL  MASTECTOMIES WITH BILATERAL MAGTRACE INJECTIONS;  Surgeon: Almond Lint, MD;  Location: [facility];  Service: General;  Laterality: Bilateral;  PEC BLOCK   Patient Active Problem List   Diagnosis Date Noted   Bilateral breast cancer (HCC) 03/05/2023   Family history of prostate cancer    Genetic testing 02/07/2023   Ductal carcinoma in situ (DCIS) of right breast 01/21/2023   Malignant neoplasm of central portion of female breast (HCC) 01/14/2023   Trigger finger, right middle finger 09/11/2021   Annual physical exam 10/31/2020   PCP NOTES >>>>>>>>>>>>>>>>>> 08/26/2020   GERD (gastroesophageal reflux disease) 07/26/2020   Hyperlipidemia 10/06/2019   Vitamin D deficiency 06/18/2018   Hypertensive disorder 05/18/2018    PCP: Willow Ora, MD  REFERRING PROVIDER: Almond Lint, MD  REFERRING DIAG:  C50.111,Z17.0 (ICD-10-CM) - Malignant neoplasm of central portion of right breast in female, estrogen receptor positive (HCC) C50.112,Z17.0 (ICD-10-CM) - Malignant neoplasm of central portion of left breast in female, estrogen receptor positive (HCC)  THERAPY DIAG:  Stiffness of right shoulder, not elsewhere classified  Stiffness of left shoulder, not elsewhere classified  Abnormal posture  Aftercare following surgery for neoplasm  Malignant neoplasm of central portion of right breast in female,  estrogen receptor positive (HCC)  Malignant neoplasm of central portion of left breast in female, estrogen receptor positive (HCC)  ONSET DATE: 03/05/23  Rationale for Evaluation and Treatment: Rehabilitation  SUBJECTIVE:                                                                                                                                                                                           SUBJECTIVE STATEMENT:  Lets stay with 1# or less   PERTINENT HISTORY: 03/05/23: bilateral mastectomies and R SLNB,on L breast cancer: DCIS, grade 2, greatest dimension: 2.5  mm, ER: 95%, positive, PR: 60%. R breast cancer: DCIS grade 2, greatest dimension 7 mm, ER: 100%, positive, PR: 85%, positive, SLNB 0/4 on R, high blood pressure  PAIN:  Are you having pain? No  PRECAUTIONS: osteoporosis  RED FLAGS: None   WEIGHT BEARING RESTRICTIONS: No  FALLS:  Has patient fallen in last 6 months? No  LIVING ENVIRONMENT: Lives with: lives with their family - mom, husband, and daughter Lives in: House/apartment Has following equipment at home: None  OCCUPATION: retired  LEISURE: before the surgery exercised in my home a little - walked, did UE and LE exercises, 2x/wk  HAND DOMINANCE: right   PRIOR LEVEL OF FUNCTION: Independent  PATIENT GOALS: to be able to do all the things I did before - to cook, clean house, hand crafts   OBJECTIVE: Note: Objective measures were completed at Evaluation unless otherwise noted.  COGNITION: Overall cognitive status: Within functional limits for tasks assessed   OBSERVATIONS / OTHER ASSESSMENTS: healing mastectomy scars with steri strips intact, gauze over bilateral drain sites due to still draining   POSTURE: forward head, rounded shoulders  UPPER EXTREMITY AROM/PROM:  A/PROM RIGHT   eval  04/14/23 05/05/23 05/27/23  Shoulder extension 45 45 50   Shoulder flexion 73 137 140 150  Shoulder abduction 38 100 135 170  Shoulder internal rotation unable     Shoulder external rotation unable 80 85     (Blank rows = not tested)  A/PROM LEFT   eval 04/14/23 05/05/23 05/27/23  Shoulder extension 50 45 50   Shoulder flexion 98 135 140 152  Shoulder abduction 71 125 140 170  Shoulder internal rotation unable     Shoulder external rotation unable 80 85     (Blank rows = not tested)   UPPER EXTREMITY STRENGTH:  Bil 5/5 without pain tested 05/05/23  LYMPHEDEMA ASSESSMENTS:  SURGERY TYPE/DATE: 03/05/23- Bilateral mastectomies for bilateral breast cancer NUMBER OF LYMPH NODES REMOVED: R 0/4 CHEMOTHERAPY:  none RADIATION:none HORMONE TREATMENT: none currently INFECTIONS: none  LYMPHEDEMA ASSESSMENTS:   LANDMARK RIGHT  eval  At axilla  35.5  15 cm proximal to olecranon process 30.5  10 cm proximal to olecranon process 28.9  Olecranon process 25.5  15 cm proximal to ulnar styloid process 24.5  10 cm proximal to ulnar styloid process 22.1  Just proximal to ulnar styloid process 17  Across hand at thumb web space 18.9  At base of 2nd digit 6.5  (Blank rows = not tested)  LANDMARK LEFT  eval  At axilla  34.5  15 cm proximal to olecranon process 30.5  10 cm proximal to olecranon process 29  Olecranon process 25.2  15 cm proximal to ulnar styloid process 24.5  10 cm proximal to ulnar styloid process 22.2  Just proximal to ulnar styloid process 17  Across hand at thumb web space 18.5  At base of 2nd digit 6.2  (Blank rows = not tested)      TODAY'S TREATMENT:                                                                                                                                          DATE:  06/17/23: Therapeutic Exercises   Roll yellow ball up wall into flex x 10 and then bil abd x 10 each Pulleys into flexion and abd x 2 mins  Bil UE 3 way raises with 1# standing with back against wall into flex, scaption and then 1# abd returning therapist demo x 10 each,  - decreased back to 1# due to pt preference  Free Motion Machine for scapular retraction UE ext x 10 each 3#, demo and tactile cues for correct UE position and technique Doorway stretch x 3 reps, 20 sec Manual Therapy  P/ROM of Lt shoulder into flex, abd, and D2 with scapular depression by therapist throughout. STM to Lt pect tendon insertion during P/ROM  06/06/23: Manual Therapy P/ROM of Lt shoulder into flex, abd, and D2 with scapular depression by therapist throughout. STM to Lt pect tendon insertion during P/ROM Self Care Pt had some questions about reconstruction options vs having her incision fixed  (dog ears smoothed out) so PTA answered these questions within scope of practice and suggested that if pt has more questions to make a consult appt with plastic surgeon for more details, which she'll have to do  any ways if she wants her mastectomy incisions smoothed out later.   06/03/23: Therapeutic Exercises   Roll yellow ball up wall into flex x 10 and then bil abd x 10 each Pulleys into flexion and abd x 2 mins with VC's to remind pt to decrease scapular compensation/relax shoulders throughout Modified downward dog on wall x 5 reps, 5 sec holds Bil UE 3 way raises with 2# standing with back against wall into flex, scaption and then 1# abd returning therapist demo x 10 each, pt was challenged by increased weights and VC's for correct technique with back against  wall/core engaged Warden/ranger for scapular retraction and then bil UE ext x 10 each, demo and tactile cues for correct UE position and technique Doorway stretch x 3 reps, 20 sec Manual Therapy  P/ROM of Rt shoulder into flex, abd, and D2 with scapular depression by therapist throughout. STM to Rt pect tendons during P/ROM  05/27/23 Discussed POC and goals x Therapeutic Exercises x  Roll yellow ball up wall into flex x 10 and then bil abd x 10 each Pulleys into flexion and abd x 2 mins with VC's to remind pt to decrease scapular compensation/relax shoulders throughout Bil UE 3 way raises with 2# standing with back against wall into flex, scaption and then 1# abd returning therapist demo x 10 each, pt was challenged by increased weights Row red x 15 Doorway stretch Manual Therapy x P/ROM of Rt shoulder into flex, abd, and D2 with scapular depression by therapist throughout. STM to Rt pect tendons during P/ROM  05/14/23: Therapeutic Exercises  Roll yellow ball up wall into flex x 10 and then bil abd x 10 each Pulleys into flexion and abd x 2 mins with VC's to remind pt to decrease scapular compensation/relax  shoulders throughout Did SOZO at pts request. Explained to her that this will give [place] helpful information but won't be as accurate without a baseline. She verbalized understanding.  Bil UE 3 way raises with 2# standing with back against wall into flex, scaption and then 1# abd returning therapist demo x 10 each, pt was challenged by increased weights Manual Therapy P/ROM of bil shoulders into flex, abd, and D2 with scapular depression by therapist throughout. STM to bil pect tendons during P/ROM  05/05/23: Rechecked goals and extended POC x Therapeutic Exercises x Pulleys into flexion and abd x 2 mins Roll yellow ball up wall into flex x 10 and then bil abd x 5 each Bil UE 3 way raises with 1# standing with back against wall into flex, scaption and abd returning therapist demo x 10 each.  Row yellow x 10 Bicep curls  1# x 10 Manual Therapyx27min P/ROM of bil shoulders into flex, abd, and D2 with scapular depression by therapist throughout. STM to bil pect tendons during P/ROM being mindful of not pulling at still healing Lt incision     PATIENT EDUCATION:  Education details: Bil UE 3 way raises Person educated: Patient with interpreter Education method: Explanation and Handouts, demonstration, tactile cues Education comprehension: verbalized understanding, returned demonstration, and will benefit from further review  HOME EXERCISE PROGRAM: Post op breast exercises beginning 1 week after last drain removed Bil UE 3 way raises   ASSESSMENT:  CLINICAL IMPRESSION: Pt is doing well.  She has been having some more Lt pectoralis region tightness and pain lately but reports it feels better after MT today.  Discussed last visit being this week and pt would prefer to do 1x per week x 3 more weeks.   OBJECTIVE IMPAIRMENTS: decreased balance, decreased knowledge of condition, decreased knowledge of use of DME, decreased ROM, decreased strength, increased fascial restrictions,  impaired UE functional use, postural dysfunction, and pain.   ACTIVITY LIMITATIONS: carrying, lifting, stairs, bathing, dressing, and reach over head  PARTICIPATION LIMITATIONS: meal prep, cleaning, laundry, community activity, and yard work  PERSONAL FACTORS:  none  are also affecting patient's functional outcome.   REHAB POTENTIAL: Good  CLINICAL DECISION MAKING: Stable/uncomplicated  EVALUATION COMPLEXITY: Low  GOALS: Goals reviewed with patient? Yes  SHORT  TERM GOALS=LONG TERM GOALS Target date: 06/17/23  Pt will demonstrate 160 degrees of bilateral shoulder flexion to allow her to reach overhead.  Baseline: Rt 73 and Lt 98 degrees Goal status: ONGOING - as of 05/27/23 - Rt 150 and Lt 152 degrees  2.  Pt will demonstrate 160 degrees of bilateral shoulder abduction to allow her to reach out to the side. Baseline: R 38, L 71 Goal status: MET  3.  Pt will report no pain across bilateral upper quadrants to allow improved comfort.  Baseline:  Goal status: MET   4.  Pt will report no difficulty ascending/descending steps as a result of balance to decrease fall risk.  Baseline:  Goal status: MET  5.  Pt will be independent in a home exercise program for long term stretching and strengthening.  Baseline:  Goal status: PARTIALLY MET - 06/03/23: continue to progress this as pt is able  6.  Pt will be able to verbalize lymphedema risk reduction practices to decrease risk of lymphedema.  Baseline:  Goal status: MET   PLAN:  PT FREQUENCY: 2x/week  PT DURATION: 4 weeks  PLANNED INTERVENTIONS: 97164- PT Re-evaluation, 97110-Therapeutic exercises, 97530- Therapeutic activity, 97112- Neuromuscular re-education, 97535- Self Care, [id number]- Manual therapy, [phone]- Orthotic Fit/training, Patient/Family education, Balance training, Joint mobilization, Manual lymph drainage, Scar mobilization, Therapeutic exercises, Therapeutic activity, Neuromuscular re-education, Gait training, and Self  Care  PLAN FOR NEXT SESSION: cont bil shoulder PROM/AAROM/into general strengthening; progress weights with pt as able  Idamae Lusher, PT 06/17/2023, 9:56 AM   3 Way Raises:      Starting Position:  Leaning against wall, walk feet a few inches away from the wall and make tummy tight (tuck hips underneath you) Press back/shoulders/head against wall as much as possible. Keep thumbs up to ceiling, elbows straight and shoulders relaxed/down throughout.  1. Lift arms in front to shoulder height 2. Lift arms a little wider into a "V" to shoulder height 3. Lift arms out to sides in a "T" to shoulder height  Perform 10 times in each direction. Hold 1-2 lbs to start with and work up to 2-3 sets of 10/day. Perform 2-3 times/week. Increase weight as able, decreasing sets of 10 each time you increase weights, then slowly working your way back up to 2-3 sets each time.    Cancer Rehab [phone]

## 2023-06-19 ENCOUNTER — Ambulatory Visit: Payer: Medicare (Managed Care) | Admitting: Rehabilitation

## 2023-06-19 ENCOUNTER — Other Ambulatory Visit: Payer: Self-pay

## 2023-06-19 ENCOUNTER — Other Ambulatory Visit (HOSPITAL_BASED_OUTPATIENT_CLINIC_OR_DEPARTMENT_OTHER): Payer: Self-pay

## 2023-06-20 ENCOUNTER — Other Ambulatory Visit (HOSPITAL_BASED_OUTPATIENT_CLINIC_OR_DEPARTMENT_OTHER): Payer: Self-pay

## 2023-06-20 ENCOUNTER — Other Ambulatory Visit: Payer: Self-pay

## 2023-06-20 MED ORDER — SUTAB 1479-225-188 MG PO TABS
24.0000 | ORAL_TABLET | ORAL | 0 refills | Status: DC
  Filled 2023-06-20: qty 24, 1d supply, fill #0

## 2023-06-23 ENCOUNTER — Other Ambulatory Visit (HOSPITAL_BASED_OUTPATIENT_CLINIC_OR_DEPARTMENT_OTHER): Payer: Self-pay

## 2023-06-25 ENCOUNTER — Encounter: Payer: Self-pay | Admitting: Certified Registered Nurse Anesthetist

## 2023-06-25 ENCOUNTER — Encounter: Payer: Self-pay | Admitting: Gastroenterology

## 2023-06-25 ENCOUNTER — Ambulatory Visit: Payer: Medicare (Managed Care) | Attending: General Surgery

## 2023-06-25 DIAGNOSIS — C50111 Malignant neoplasm of central portion of right female breast: Secondary | ICD-10-CM | POA: Diagnosis not present

## 2023-06-25 DIAGNOSIS — Z170 Estrogen receptor positive status [ER+]: Secondary | ICD-10-CM | POA: Diagnosis not present

## 2023-06-25 DIAGNOSIS — R293 Abnormal posture: Secondary | ICD-10-CM | POA: Diagnosis not present

## 2023-06-25 DIAGNOSIS — M25611 Stiffness of right shoulder, not elsewhere classified: Secondary | ICD-10-CM | POA: Diagnosis not present

## 2023-06-25 DIAGNOSIS — C50112 Malignant neoplasm of central portion of left female breast: Secondary | ICD-10-CM | POA: Diagnosis not present

## 2023-06-25 DIAGNOSIS — M25612 Stiffness of left shoulder, not elsewhere classified: Secondary | ICD-10-CM | POA: Insufficient documentation

## 2023-06-25 DIAGNOSIS — Z483 Aftercare following surgery for neoplasm: Secondary | ICD-10-CM | POA: Diagnosis not present

## 2023-06-25 NOTE — Therapy (Addendum)
 OUTPATIENT PHYSICAL THERAPY  UPPER EXTREMITY ONCOLOGY TREATMENT  Patient Name: Lauren Hodge MRN: [id number] DOB:[birth year], 68 y.o., female Today's Date: 06/25/2023  END OF SESSION:  PT End of Session - 06/25/23 1012     Visit Number 13    Number of Visits 23    Date for PT Re-Evaluation 07/01/23    PT Start Time 1007    PT Stop Time 1104    PT Time Calculation (min) 57 min    Activity Tolerance Patient tolerated treatment well    Behavior During Therapy WFL for tasks assessed/performed              Past Medical History:  Diagnosis Date   Blood transfusion without reported diagnosis    Breast mass    Cancer (HCC)    Carpal tunnel syndrome    Family history of prostate cancer    Fracture of carpal bone 2018   bilateral   GERD (gastroesophageal reflux disease)    Hemorrhoids    Hiatal hernia 02/2019   seen on EGD    History of left breast biopsy    Benign   HTN (hypertension)    Hyperlipidemia    Kidney stones    Osteoporosis    Osteoporosis    Vitamin D deficiency    Past Surgical History:  Procedure Laterality Date   BIOPSY THYROID     BREAST BIOPSY Left 2010   x2, 2010-2012 (no cancer)   BREAST BIOPSY Right 01/10/2023   [place] RT BREAST BX W LOC DEV 1ST LESION IMG BX SPEC US GUIDE 01/10/2023 [facility]   BREAST BIOPSY Right 01/10/2023   MM RT BREAST BX W LOC DEV 1ST LESION IMAGE BX SPEC STEREO GUIDE 01/10/2023 [facility]   BREAST BIOPSY Right 01/22/2023   MM RT BREAST BX W LOC DEV 1ST LESION IMAGE BX SPEC STEREO GUIDE 01/22/2023 [facility]   BREAST BIOPSY Right 01/22/2023   MM RT BREAST BX W LOC DEV EA AD LESION IMG BX SPEC STEREO GUIDE 01/22/2023 [facility]   BREAST BIOPSY Left 01/22/2023   MM LT BREAST BX W LOC DEV 1ST LESION IMAGE BX SPEC STEREO GUIDE 01/22/2023 [facility]   BREAST EXCISIONAL BIOPSY Left    CHOLECYSTECTOMY  2015   HEMORRHOIDECTOMY WITH HEMORRHOID BANDING  04/22/2017   SIMPLE MASTECTOMY WITH  AXILLARY SENTINEL NODE BIOPSY Bilateral 03/05/2023   Procedure: BILATERAL MASTECTOMIES WITH BILATERAL MAGTRACE INJECTIONS;  Surgeon: Almond Lint, MD;  Location: [facility];  Service: General;  Laterality: Bilateral;  PEC BLOCK   Patient Active Problem List   Diagnosis Date Noted   Bilateral breast cancer (HCC) 03/05/2023   Family history of prostate cancer    Genetic testing 02/07/2023   Ductal carcinoma in situ (DCIS) of right breast 01/21/2023   Malignant neoplasm of central portion of female breast (HCC) 01/14/2023   Trigger finger, right middle finger 09/11/2021   Annual physical exam 10/31/2020   PCP NOTES >>>>>>>>>>>>>>>>>> 08/26/2020   GERD (gastroesophageal reflux disease) 07/26/2020   Hyperlipidemia 10/06/2019   Vitamin D deficiency 06/18/2018   Hypertensive disorder 05/18/2018    PCP: Willow Ora, MD  REFERRING PROVIDER: Almond Lint, MD  REFERRING DIAG:  C50.111,Z17.0 (ICD-10-CM) - Malignant neoplasm of central portion of right breast in female, estrogen receptor positive (HCC) C50.112,Z17.0 (ICD-10-CM) - Malignant neoplasm of central portion of left breast in female, estrogen receptor positive (HCC)  THERAPY DIAG:  Stiffness of right shoulder, not elsewhere classified  Stiffness of left shoulder, not elsewhere  classified  Abnormal posture  Aftercare following surgery for neoplasm  Malignant neoplasm of central portion of right breast in female, estrogen receptor positive (HCC)  Malignant neoplasm of central portion of left breast in female, estrogen receptor positive (HCC)  ONSET DATE: 03/05/23  Rationale for Evaluation and Treatment: Rehabilitation  SUBJECTIVE:                                                                                                                                                                                           SUBJECTIVE STATEMENT:  I feel like I'm doing pretty good at home.  I am still limited with how much I  can lift at home but that's from old wrist injury from my job for years and osteoporosis, not my shoulders. I am ready to make today my last visit.   PERTINENT HISTORY: 03/05/23: bilateral mastectomies and R SLNB,on L breast cancer: DCIS, grade 2, greatest dimension: 2.5 mm, ER: 95%, positive, PR: 60%. R breast cancer: DCIS grade 2, greatest dimension 7 mm, ER: 100%, positive, PR: 85%, positive, SLNB 0/4 on R, high blood pressure  PAIN:  Are you having pain? No  PRECAUTIONS: osteoporosis  RED FLAGS: None   WEIGHT BEARING RESTRICTIONS: No  FALLS:  Has patient fallen in last 6 months? No  LIVING ENVIRONMENT: Lives with: lives with their family - mom, husband, and daughter Lives in: House/apartment Has following equipment at home: None  OCCUPATION: retired  LEISURE: before the surgery exercised in my home a little - walked, did UE and LE exercises, 2x/wk  HAND DOMINANCE: right   PRIOR LEVEL OF FUNCTION: Independent  PATIENT GOALS: to be able to do all the things I did before - to cook, clean house, hand crafts   OBJECTIVE: Note: Objective measures were completed at Evaluation unless otherwise noted.  COGNITION: Overall cognitive status: Within functional limits for tasks assessed   OBSERVATIONS / OTHER ASSESSMENTS: healing mastectomy scars with steri strips intact, gauze over bilateral drain sites due to still draining   POSTURE: forward head, rounded shoulders  UPPER EXTREMITY AROM/PROM:  A/PROM RIGHT   eval  04/14/23 05/05/23 05/27/23 06/25/23  Shoulder extension 45 45 50    Shoulder flexion 73 137 140 150 156  Shoulder abduction 38 100 135 170 175  Shoulder internal rotation unable      Shoulder external rotation unable 80 85      (Blank rows = not tested)  A/PROM LEFT   eval 04/14/23 05/05/23 05/27/23 06/25/23  Shoulder extension 50 45 50    Shoulder flexion 98 135 140 152 158  Shoulder abduction 71 125 140 170 167  Shoulder internal rotation unable      Shoulder  external rotation unable 80 85      (Blank rows = not tested)   UPPER EXTREMITY STRENGTH:  Bil 5/5 without pain tested 05/05/23  LYMPHEDEMA ASSESSMENTS:  SURGERY TYPE/DATE: 03/05/23- Bilateral mastectomies for bilateral breast cancer NUMBER OF LYMPH NODES REMOVED: R 0/4 CHEMOTHERAPY: none RADIATION:none HORMONE TREATMENT: none currently INFECTIONS: none  LYMPHEDEMA ASSESSMENTS:   LANDMARK RIGHT  eval  At axilla  35.5  15 cm proximal to olecranon process 30.5  10 cm proximal to olecranon process 28.9  Olecranon process 25.5  15 cm proximal to ulnar styloid process 24.5  10 cm proximal to ulnar styloid process 22.1  Just proximal to ulnar styloid process 17  Across hand at thumb web space 18.9  At base of 2nd digit 6.5  (Blank rows = not tested)  LANDMARK LEFT  eval  At axilla  34.5  15 cm proximal to olecranon process 30.5  10 cm proximal to olecranon process 29  Olecranon process 25.2  15 cm proximal to ulnar styloid process 24.5  10 cm proximal to ulnar styloid process 22.2  Just proximal to ulnar styloid process 17  Across hand at thumb web space 18.5  At base of 2nd digit 6.2  (Blank rows = not tested)      TODAY'S TREATMENT:                                                                                                                                          DATE:  06/25/23: Therapeutic Exercises Roll yellow ball up wall into flex x 10 and then bil abd x 10 each, tactile cues to decrease Rt scap compensation with abd Bil UE 3 way raises with 1# standing with back against wall, 2 x 10 each, pt did not have increased Rt wrist pain with keeping weight at 1# Free Motion Machine for scapular retraction 3#, 2 x 10 each  Pulleys into flexion and abd x 2 mins each Doorway stretch x 3 reps, 20 sec Supine over towel roll along T-spine to instruct pt in bil UE abd in a "snow angel" and other bil UE A/ROM exs Manual Therapy  P/ROM of Lt shoulder into flex, abd, and D2  with scapular depression by therapist throughout, then same on Rt side STM to Lt pect tendon insertion during P/ROM, then same on Rt side  06/17/23: Therapeutic Exercises   Roll yellow ball up wall into flex x 10 and then bil abd x 10 each Pulleys into flexion and abd x 2 mins  Bil UE 3 way raises with 1# standing with back against wall into flex, scaption and then 1# abd returning therapist demo x 10 each,  - decreased back to 1# due to pt preference  Free Motion Machine for scapular retraction UE ext x 10 each 3#, demo and tactile cues for  correct UE position and technique Doorway stretch x 3 reps, 20 sec Manual Therapy  P/ROM of Lt shoulder into flex, abd, and D2 with scapular depression by therapist throughout. STM to Lt pect tendon insertion during P/ROM  06/06/23: Manual Therapy P/ROM of Lt shoulder into flex, abd, and D2 with scapular depression by therapist throughout. STM to Lt pect tendon insertion during P/ROM Self Care Pt had some questions about reconstruction options vs having her incision fixed (dog ears smoothed out) so PTA answered these questions within scope of practice and suggested that if pt has more questions to make a consult appt with plastic surgeon for more details, which she'll have to do  any ways if she wants her mastectomy incisions smoothed out later.        PATIENT EDUCATION:  Education details: Bil UE 3 way raises Person educated: Patient with interpreter Education method: Explanation and Handouts, demonstration, tactile cues Education comprehension: verbalized understanding, returned demonstration, and will benefit from further review  HOME EXERCISE PROGRAM: Post op breast exercises beginning 1 week after last drain removed Bil UE 3 way raises   ASSESSMENT:  CLINICAL IMPRESSION: Pt has done excellent overall with this episode of care and reports feeling much improved since start of care with her pain and A/ROM. She has met all goals except for  bil shoulder flexion which she was only lacking by 2 degrees on the Rt and 4 degrees on the Lt. She is ready for D/C at this time. She will cont to be followed with our [facility] screening program for up to 4 years from surgery with every 3 months screens the first 2 years and then every 6 month screens the last 2 years, after that she can return annual as long as she would like. Pt is agreeable to D/C today and to continuing with SOZO screens. She also knows that if she has any issues from her surgery in the future that require more physical therapy she can return to this clinic with a new MD referral and will be happy to see her again.   OBJECTIVE IMPAIRMENTS: decreased balance, decreased knowledge of condition, decreased knowledge of use of DME, decreased ROM, decreased strength, increased fascial restrictions, impaired UE functional use, postural dysfunction, and pain.   ACTIVITY LIMITATIONS: carrying, lifting, stairs, bathing, dressing, and reach over head  PARTICIPATION LIMITATIONS: meal prep, cleaning, laundry, community activity, and yard work  PERSONAL FACTORS:  none  are also affecting patient's functional outcome.   REHAB POTENTIAL: Good  CLINICAL DECISION MAKING: Stable/uncomplicated  EVALUATION COMPLEXITY: Low  GOALS: Goals reviewed with patient? Yes  SHORT TERM GOALS=LONG TERM GOALS Target date: 06/17/23  Pt will demonstrate 160 degrees of bilateral shoulder flexion to allow her to reach overhead.  Baseline: Rt 73 and Lt 98 degrees Goal status: PROGRESSED - as of 05/27/23 - Rt 150 and Lt 152 degrees; 06/25/23 - Rt 156 and Lt 158 degrees  2.  Pt will demonstrate 160 degrees of bilateral shoulder abduction to allow her to reach out to the side. Baseline: R 38, L 71 Goal status: MET  3.  Pt will report no pain across bilateral upper quadrants to allow improved comfort.  Baseline:  Goal status: MET   4.  Pt will report no difficulty ascending/descending steps as a result of balance  to decrease fall risk.  Baseline:  Goal status: MET  5.  Pt will be independent in a home exercise program for long term stretching and strengthening.  Baseline:  Goal  status: MET - 06/03/23: continue to progress this as pt is able  6.  Pt will be able to verbalize lymphedema risk reduction practices to decrease risk of lymphedema.  Baseline:  Goal status: MET   PLAN:  PT FREQUENCY: 2x/week  PT DURATION: 4 weeks  PLANNED INTERVENTIONS: 97164- PT Re-evaluation, 97110-Therapeutic exercises, 97530- Therapeutic activity, 97112- Neuromuscular re-education, 97535- Self Care, 16109- Manual therapy, [phone]- Orthotic Fit/training, Patient/Family education, Balance training, Joint mobilization, Manual lymph drainage, Scar mobilization, Therapeutic exercises, Therapeutic activity, Neuromuscular re-education, Gait training, and Self Care  PLAN FOR NEXT SESSION: D/C this visit and cont SOZO screens as per above in assessment.   PHYSICAL THERAPY DISCHARGE SUMMARY  Visits from Start of Care: 13  Current functional level related to goals / functional outcomes: All goals met   Remaining deficits: Lymphedema risk    Education / Equipment: Final HEP  Plan: Patient agrees to discharge.  Patient goals were not met. Patient is being discharged due to meeting the stated rehab goals.        Hermenia Bers, PTA 06/25/2023, 11:37 AM   3 Way Raises:      Starting Position:  Leaning against wall, walk feet a few inches away from the wall and make tummy tight (tuck hips underneath you) Press back/shoulders/head against wall as much as possible. Keep thumbs up to ceiling, elbows straight and shoulders relaxed/down throughout.  1. Lift arms in front to shoulder height 2. Lift arms a little wider into a "V" to shoulder height 3. Lift arms out to sides in a "T" to shoulder height  Perform 10 times in each direction. Hold 1-2 lbs to start with and work up to 2-3 sets of  10/day. Perform 2-3 times/week. Increase weight as able, decreasing sets of 10 each time you increase weights, then slowly working your way back up to 2-3 sets each time.    [facility] [phone]

## 2023-06-26 ENCOUNTER — Other Ambulatory Visit: Payer: Self-pay

## 2023-06-27 ENCOUNTER — Encounter: Payer: Self-pay | Admitting: Gastroenterology

## 2023-06-27 ENCOUNTER — Ambulatory Visit: Payer: Medicare (Managed Care) | Admitting: Gastroenterology

## 2023-06-27 VITALS — BP 156/90 | HR 64 | Temp 98.0°F | Resp 13 | Ht 59.0 in | Wt 162.0 lb

## 2023-06-27 DIAGNOSIS — K641 Second degree hemorrhoids: Secondary | ICD-10-CM

## 2023-06-27 DIAGNOSIS — Z1211 Encounter for screening for malignant neoplasm of colon: Secondary | ICD-10-CM | POA: Diagnosis not present

## 2023-06-27 DIAGNOSIS — Z9889 Other specified postprocedural states: Secondary | ICD-10-CM | POA: Diagnosis not present

## 2023-06-27 DIAGNOSIS — Z1509 Genetic susceptibility to other malignant neoplasm: Secondary | ICD-10-CM

## 2023-06-27 DIAGNOSIS — D123 Benign neoplasm of transverse colon: Secondary | ICD-10-CM | POA: Diagnosis not present

## 2023-06-27 DIAGNOSIS — Z860100 Personal history of colon polyps, unspecified: Secondary | ICD-10-CM

## 2023-06-27 DIAGNOSIS — K5792 Diverticulitis of intestine, part unspecified, without perforation or abscess without bleeding: Secondary | ICD-10-CM

## 2023-06-27 DIAGNOSIS — D124 Benign neoplasm of descending colon: Secondary | ICD-10-CM

## 2023-06-27 MED ORDER — SODIUM CHLORIDE 0.9 % IV SOLN: 500.0000 mL | Freq: Once | INTRAVENOUS | Status: DC

## 2023-06-27 NOTE — Progress Notes (Signed)
 Pt's states no medical or surgical changes since previsit or office visit.

## 2023-06-27 NOTE — Progress Notes (Signed)
 Called to room to assist during endoscopic procedure.  Patient ID and intended procedure confirmed with present staff. Received instructions for my participation in the procedure from the performing physician.

## 2023-06-27 NOTE — Progress Notes (Signed)
 GASTROENTEROLOGY PROCEDURE H&P NOTE   Primary Care Physician: Wanda Plump, MD    Reason for Procedure:  Colon polyp surveillance  Plan:    Colonoscopy  Patient is appropriate for endoscopic procedure(s) in the ambulatory ([facility]) setting.  The nature of the procedure, as well as the risks, benefits, and alternatives were carefully and thoroughly reviewed with the patient. Ample time for discussion and questions allowed. The patient understood, was satisfied, and agreed to proceed.     HPI: Lauren Hodge is a 68 y.o. female who presents for colonoscopy for ongoing colon polyp surveillance and colon cancer screening.  No active GI symptoms.  No known family history of colon cancer or related malignancy.  Patient is otherwise without complaints or active issues today.  Endoscopic History: - 2015: Colonoscopy [facility]): 20 mm pedunculated polyp removed from transverse colon, 30 mm sessile polyp in transverse colon removed piecemeal with site tattooed.  Path not available for review - 03/22/2019: EGD: Small hiatal hernia, otherwise normal - 03/22/2019: Colonoscopy: Located tattoo site in descending colon without residual or recurrent polyp, 2 mm sigmoid benign polyp, otherwise normal.  Repeat in 5 years    Past Medical History:  Diagnosis Date   Blood transfusion without reported diagnosis    Breast mass    Cancer (HCC)    Carpal tunnel syndrome    Family history of prostate cancer    Fracture of carpal bone 2018   bilateral   GERD (gastroesophageal reflux disease)    Hemorrhoids    Hiatal hernia 02/2019   seen on EGD    History of left breast biopsy    Benign   HTN (hypertension)    Hyperlipidemia    Kidney stones    Osteoporosis    Osteoporosis    Vitamin D deficiency     Past Surgical History:  Procedure Laterality Date   BIOPSY THYROID     BREAST BIOPSY Left 2010   x2, 2010-2012 (no cancer)   BREAST BIOPSY Right 01/10/2023   [place] RT BREAST BX W LOC DEV 1ST  LESION IMG BX SPEC US GUIDE 01/10/2023 [facility]   BREAST BIOPSY Right 01/10/2023   MM RT BREAST BX W LOC DEV 1ST LESION IMAGE BX SPEC STEREO GUIDE 01/10/2023 [facility]   BREAST BIOPSY Right 01/22/2023   MM RT BREAST BX W LOC DEV 1ST LESION IMAGE BX SPEC STEREO GUIDE 01/22/2023 [facility]   BREAST BIOPSY Right 01/22/2023   MM RT BREAST BX W LOC DEV EA AD LESION IMG BX SPEC STEREO GUIDE 01/22/2023 [facility]   BREAST BIOPSY Left 01/22/2023   MM LT BREAST BX W LOC DEV 1ST LESION IMAGE BX SPEC STEREO GUIDE 01/22/2023 [facility]   BREAST EXCISIONAL BIOPSY Left    CHOLECYSTECTOMY  2015   HEMORRHOIDECTOMY WITH HEMORRHOID BANDING  04/22/2017   SIMPLE MASTECTOMY WITH AXILLARY SENTINEL NODE BIOPSY Bilateral 03/05/2023   Procedure: BILATERAL MASTECTOMIES WITH BILATERAL MAGTRACE INJECTIONS;  Surgeon: Almond Lint, MD;  Location: [facility];  Service: General;  Laterality: Bilateral;  PEC BLOCK    Prior to Admission medications   Medication Sig Start Date End Date Taking? Authorizing Provider  losartan (COZAAR) 50 MG tablet Take 1 tablet (50 mg total) by mouth daily. 01/20/23  Yes Paz, Nolon Rod, MD  metoprolol succinate (TOPROL-XL) 25 MG 24 hr tablet Take 1 tablet (25 mg total) by mouth daily. Take with or immediately following a meal 02/19/23  Yes Wanda Plump, MD  acetaminophen (  TYLENOL) 325 MG tablet Take 650 mg by mouth every 6 (six) hours as needed. Patient not taking: Reported on 06/27/2023    [provider]  gabapentin (NEURONTIN) 100 MG capsule Take 1 capsule (100 mg total) by mouth 2 (two) times daily. Patient not taking: Reported on 06/27/2023 03/28/23     gabapentin (NEURONTIN) 100 MG capsule Take 1 capsule (100 mg total) by mouth 2 (two) times daily as needed (burning pain). Patient not taking: Reported on 06/27/2023 04/11/23     methocarbamol (ROBAXIN) 500 MG tablet Take 1 tablet (500 mg total) by mouth every 6 (six) hours as needed for  muscle spasms. Patient not taking: Reported on 06/27/2023 03/05/23   Almond Lint, MD  Multiple Vitamins-Minerals (CENTRUM ADULTS PO) Take by mouth. Patient not taking: Reported on 06/27/2023    [provider]  ondansetron (ZOFRAN-ODT) 4 MG disintegrating tablet Take 1 tablet (4 mg total) by mouth every 6 (six) hours as needed for nausea. Patient not taking: Reported on 06/27/2023 03/05/23   Almond Lint, MD  oxyCODONE (OXY IR/ROXICODONE) 5 MG immediate release tablet Take 1-2 tablets (5-10 mg total) by mouth every 4 (four) hours as needed for moderate pain (pain score 4-6). Patient not taking: Reported on 06/27/2023 03/05/23   Almond Lint, MD  oxyCODONE (OXY IR/ROXICODONE) 5 MG immediate release tablet Take 1-2 tablets (5-10 mg total) by mouth every 4 (four) hours as needed for Pain (as needed for moderate pain (pain score 4-6)) Patient not taking: Reported on 06/27/2023 03/17/23     oxyCODONE (OXY IR/ROXICODONE) 5 MG immediate release tablet Take 1 tablet (5 mg total) by mouth every 6 (six) hours as needed for Pain (as needed for moderate pain (pain score 4-6)) Patient not taking: Reported on 06/27/2023 03/28/23     pantoprazole (PROTONIX) 20 MG tablet Take 1 tablet (20 mg total) by mouth daily before breakfast. Patient not taking: Reported on 06/27/2023 04/02/23   Lou Irigoyen V, DO  VITAMIN D, CHOLECALCIFEROL, PO Take by mouth. Patient not taking: Reported on 06/27/2023    [provider]    Current Outpatient Medications  Medication Sig Dispense Refill   losartan (COZAAR) 50 MG tablet Take 1 tablet (50 mg total) by mouth daily. 90 tablet 1   metoprolol succinate (TOPROL-XL) 25 MG 24 hr tablet Take 1 tablet (25 mg total) by mouth daily. Take with or immediately following a meal 90 tablet 1   acetaminophen (TYLENOL) 325 MG tablet Take 650 mg by mouth every 6 (six) hours as needed. (Patient not taking: Reported on 06/27/2023)     gabapentin (NEURONTIN) 100 MG capsule Take 1 capsule  (100 mg total) by mouth 2 (two) times daily. (Patient not taking: Reported on 06/27/2023) 60 capsule 3   gabapentin (NEURONTIN) 100 MG capsule Take 1 capsule (100 mg total) by mouth 2 (two) times daily as needed (burning pain). (Patient not taking: Reported on 06/27/2023) 30 capsule 3   methocarbamol (ROBAXIN) 500 MG tablet Take 1 tablet (500 mg total) by mouth every 6 (six) hours as needed for muscle spasms. (Patient not taking: Reported on 06/27/2023) 20 tablet 1   Multiple Vitamins-Minerals (CENTRUM ADULTS PO) Take by mouth. (Patient not taking: Reported on 06/27/2023)     ondansetron (ZOFRAN-ODT) 4 MG disintegrating tablet Take 1 tablet (4 mg total) by mouth every 6 (six) hours as needed for nausea. (Patient not taking: Reported on 06/27/2023) 20 tablet 0   oxyCODONE (OXY IR/ROXICODONE) 5 MG immediate release tablet Take 1-2 tablets (5-10  mg total) by mouth every 4 (four) hours as needed for moderate pain (pain score 4-6). (Patient not taking: Reported on 06/27/2023) 20 tablet 0   oxyCODONE (OXY IR/ROXICODONE) 5 MG immediate release tablet Take 1-2 tablets (5-10 mg total) by mouth every 4 (four) hours as needed for Pain (as needed for moderate pain (pain score 4-6)) (Patient not taking: Reported on 06/27/2023) 20 tablet 0   oxyCODONE (OXY IR/ROXICODONE) 5 MG immediate release tablet Take 1 tablet (5 mg total) by mouth every 6 (six) hours as needed for Pain (as needed for moderate pain (pain score 4-6)) (Patient not taking: Reported on 06/27/2023) 20 tablet 0   pantoprazole (PROTONIX) 20 MG tablet Take 1 tablet (20 mg total) by mouth daily before breakfast. (Patient not taking: Reported on 06/27/2023) 90 tablet 3   VITAMIN D, CHOLECALCIFEROL, PO Take by mouth. (Patient not taking: Reported on 06/27/2023)     Current Facility-Administered Medications  Medication Dose Route Frequency Provider Last Rate Last Admin   0.9 %  sodium chloride infusion  500 mL Intravenous Once Jeniya Flannigan V, DO        Allergies as of  06/27/2023   (No Known Allergies)    Family History  Problem Relation Age of Onset   Hypertension Mother    Prostate cancer Father    Tongue cancer Maternal Aunt    Cervical cancer Maternal Grandmother    Liver disease Neg Hx    Colon cancer Neg Hx    Esophageal cancer Neg Hx    Colon polyps Neg Hx     Social History   Socioeconomic History   Marital status: Married    Spouse name: Not on file   Number of children: 2   Years of education: Not on file   Highest education level: Not on file  Occupational History   Occupation: retired   Tobacco Use   Smoking status: Former    Current packs/day: 0.00    Types: Cigarettes    Quit date: 08/25/2018    Years since quitting: 4.8   Smokeless tobacco: Never   Tobacco comments:    used to smoke rarely   Vaping Use   Vaping status: Never Used  Substance and Sexual Activity   Alcohol use: Not Currently    Comment: occasional   Drug use: Never   Sexual activity: Not Currently    Birth control/protection: Post-menopausal  Other Topics Concern   Not on file  Social History Narrative   From [place] from [place] 2019   Household: pt and husband; daughter    Social Drivers of Corporate investment banker Strain: Not on file  Food Insecurity: No Food Insecurity (07/27/2020)   Received from [facility], [facility]   Hunger Vital Sign    Worried About Running Out of Food in the Last Year: Never true    Ran Out of Food in the Last Year: Never true  Transportation Needs: No Transportation Needs (01/20/2023)   PRAPARE - Administrator, Civil Service (Medical): No    Lack of Transportation (Non-Medical): No  Physical Activity: Not on file  Stress: Not on file  Social Connections: Unknown (09/04/2021)   Received from [facility], [facility]   Social Network    Social Network: Not on file  Intimate Partner Violence: Not At Risk (01/20/2023)   Humiliation, Afraid, Rape, and Kick questionnaire    Fear of Current  or Ex-Partner: No    Emotionally Abused: No  Physically Abused: No    Sexually Abused: No    Physical Exam: Vital signs in last 24 hours: @BP  (!) 205/100   Pulse 64   Temp 98 F (36.7 C) (Skin)   Resp 16   Ht 4\' 11"  (1.499 m)   Wt 162 lb (73.5 kg)   SpO2 100%   BMI 32.72 kg/m  GEN: NAD EYE: Sclerae anicteric ENT: MMM CV: Non-tachycardic Pulm: CTA b/l GI: Soft, NT/ND NEURO:  Alert & Oriented x 3   Doristine Locks, DO [facility]   06/27/2023 9:05 AM

## 2023-06-27 NOTE — Progress Notes (Signed)
 Report given to PACU, vss

## 2023-06-27 NOTE — Op Note (Signed)
  [facility] Patient Name: Lauren Hodge Procedure Date: 06/27/2023 8:49 AM MRN: [id number] Endoscopist: Doristine Locks , MD, [id number] Age: 68 Referring MD:  Date of Birth: 07-Jan-1956 Gender: Female Account #: [id number] Procedure:                Colonoscopy Indications:              High risk colon cancer surveillance: Personal                            history of colonic polyps                           ?" 2015: Colonoscopy [facility]): 20 mm pedunculated                            polyp removed from transverse colon, 30 mm sessile                            polyp in transverse colon removed piecemeal with                            site tattooed. Path not available for review                           ?" 03/22/2019: EGD: Small hiatal hernia, otherwise                            normal                           ?" 03/22/2019: Colonoscopy: Located tattoo site in                            descending colon without residual or recurrent                            polyp, 2 mm sigmoid benign polyp, otherwise normal.                            Repeat in 5 years Medicines:                Monitored Anesthesia Care Procedure:                Pre-Anesthesia Assessment:                           - Prior to the procedure, a History and Physical                            was performed, and patient medications and                            allergies were reviewed. The patient's tolerance of                            previous anesthesia was  also reviewed. The risks                            and benefits of the procedure and the sedation                            options and risks were discussed with the patient.                            All questions were answered, and informed consent                            was obtained. Prior Anticoagulants: The patient has                            taken no anticoagulant or antiplatelet agents. ASA                            Grade  Assessment: II - A patient with mild systemic                            disease. After reviewing the risks and benefits,                            the patient was deemed in satisfactory condition to                            undergo the procedure.                           After obtaining informed consent, the colonoscope                            was passed under direct vision. Throughout the                            procedure, the patient's blood pressure, pulse, and                            oxygen saturations were monitored continuously. The                            Olympus Scope [id number] was introduced through the                            anus and advanced to the the cecum, identified by                            appendiceal orifice and ileocecal valve. The                            colonoscopy was performed without difficulty. The  patient tolerated the procedure well. The quality                            of the bowel preparation was good. The ileocecal                            valve, appendiceal orifice, and rectum were                            photographed. Scope In: 9:11:51 AM Scope Out: 9:22:17 AM Scope Withdrawal Time: 0 hours 8 minutes 10 seconds  Total Procedure Duration: 0 hours 10 minutes 26 seconds  Findings:                 Hemorrhoids were found on perianal exam.                           Two sessile polyps were found in the proximal                            descending colon and distal transverse colon. The                            polyps were 3 to 4 mm in size. These polyps were                            removed with a cold snare. Resection and retrieval                            were complete. Estimated blood loss was minimal.                           A tattoo was seen in the descending colon. A faint,                            well-healed post-polypectomy scar was found at the                            tattoo site.  There was no residual polyp on white                            light and narrow-band imaging.                           Non-bleeding internal hemorrhoids were found during                            retroflexion. The hemorrhoids were small and Grade                            II (internal hemorrhoids that prolapse but reduce                            spontaneously). Complications:  No immediate complications. Estimated Blood Loss:     Estimated blood loss was minimal. Impression:               - Hemorrhoids found on perianal exam.                           - Two 3 to 4 mm polyps in the proximal descending                            colon and in the distal transverse colon, removed                            with a cold snare. Resected and retrieved.                           - A tattoo was seen in the descending colon. A                            post-polypectomy scar was found at the tattoo site.                           - Non-bleeding internal hemorrhoids. Recommendation:           - Patient has a contact number available for                            emergencies. The signs and symptoms of potential                            delayed complications were discussed with the                            patient. Return to normal activities tomorrow.                            Written discharge instructions were provided to the                            patient.                           - Resume previous diet.                           - Continue present medications.                           - Await pathology results.                           - Repeat colonoscopy in 5 years for surveillance.                           - Return to GI clinic PRN.                           -  Use fiber, for example Citrucel, Fibercon, Konsyl                            or Metamucil.                           - Internal hemorrhoids were noted on this study and                            may be  amenable to hemorrhoid band ligation. If you                            are interested in further treatment of these                            hemorrhoids with band ligation, please contact my                            clinic to set up an appointment for evaluation and                            treatment. Doristine Locks, MD 06/27/2023 9:30:14 AM

## 2023-06-27 NOTE — Progress Notes (Signed)
 0907 BP 196/97, Labetalol given IV, MD update, vss

## 2023-06-27 NOTE — Patient Instructions (Addendum)
**  Handout given on polyps and hemorrhoids**  USTED TUVO UN PROCEDIMIENTO ENDOSCPICO HOY EN EL [facility]:   Lea el informe del procedimiento que se le entreg para cualquier pregunta especfica sobre lo que se Dentist.  Si el informe del examen no responde a sus preguntas, por favor llame a su gastroenterlogo para aclararlo.  Si usted solicit que no se le den [employer] de lo que se Clinical cytogeneticist en su procedimiento al [employer] va a cuidar, entonces el informe del procedimiento se ha incluido en un sobre sellado para que usted lo revise despus cuando le sea ms conveniente.   LO QUE PUEDE ESPERAR: Algunas sensaciones de hinchazn en el abdomen.  Puede tener ms gases de lo normal.  El caminar puede ayudarle a eliminar el aire que se le puso en el tracto gastrointestinal durante el procedimiento y reducir la hinchazn.  Si le hicieron una endoscopia inferior (como una colonoscopia o una sigmoidoscopia flexible), podra notar manchas de sangre en las heces fecales o en el papel higinico.  Si se someti a una preparacin intestinal para su procedimiento, es posible que no tenga una evacuacin intestinal normal durante [employer].   Tenga en cuenta:  Es posible que note un poco de irritacin y congestin en la nariz o algn drenaje.  Esto es debido al oxgeno [employer] durante su procedimiento.  No hay que preocuparse y esto debe desaparecer ms o Regulatory affairs officer.   SNTOMAS PARA REPORTAR INMEDIATAMENTE:  Despus de una endoscopia inferior (colonoscopia o sigmoidoscopia flexible):  Cantidades excesivas de sangre en las heces fecales  Sensibilidad significativa o empeoramiento de los dolores abdominales   Hinchazn aguda del abdomen que antes no tena   Fiebre de 100F o ms   Para asuntos urgentes o de Associate Professor, puede comunicarse con un gastroenterlogo a cualquier hora llamando al [phone].  DIETA:  Recomendamos una comida pequea al principio, pero  luego puede continuar con su dieta normal.  Tome muchos lquidos, Tax adviser las bebidas alcohlicas durante 24 horas.    ACTIVIDAD:  Debe planear tomarse las cosas con calma por el resto del da y no debe CONDUCIR ni usar maquinaria pesada Patent examiner (debido a los medicamentos de sedacin utilizados durante el examen).     SEGUIMIENTO: Nuestro personal llamar al nmero que aparece en su historial al siguiente da hbil de su procedimiento para ver cmo se siente y para responder cualquier pregunta o inquietud que pueda tener con respecto a la informacin que se le dio despus del procedimiento. Si no podemos contactarle, le dejaremos un mensaje.  Sin embargo, si se siente bien y no tiene English as a second language teacher, no es necesario que nos devuelva la llamada.  Asumiremos que ha regresado a sus actividades diarias normales sin incidentes. Si se le tomaron algunas biopsias, le contactaremos por telfono o por carta en las prximas 3 semanas.  Si no ha sabido [employer] biopsias en el transcurso de 3 semanas, por favor llmenos al [phone].   FIRMAS/CONFIDENCIALIDAD: Usted y/o el acompaante que le cuide han firmado documentos que se ingresarn en su historial mdico electrnico.  Estas firmas atestiguan el hecho de que la informacin anterior

## 2023-06-30 ENCOUNTER — Telehealth: Payer: Self-pay

## 2023-06-30 NOTE — Telephone Encounter (Signed)
  Follow up Call-     06/27/2023    8:04 AM  Call back number  Post procedure Call Back phone  # [phone]  Permission to leave phone message Yes     Patient questions:  Do you have a fever, pain , or abdominal swelling? No. Pain Score  0 *  Have you tolerated food without any problems? Yes.    Have you been able to return to your normal activities? Yes.    Do you have any questions about your discharge instructions: Diet   No. Medications  No. Follow up visit  No.  Do you have questions or concerns about your Care? No.  Actions: * If pain score is 4 or above: No action needed, pain <4.

## 2023-07-01 ENCOUNTER — Encounter: Payer: Self-pay | Admitting: Gastroenterology

## 2023-07-01 LAB — SURGICAL PATHOLOGY

## 2023-07-29 ENCOUNTER — Other Ambulatory Visit (INDEPENDENT_AMBULATORY_CARE_PROVIDER_SITE_OTHER): Payer: Medicare (Managed Care)

## 2023-07-29 ENCOUNTER — Other Ambulatory Visit: Payer: Self-pay | Admitting: Internal Medicine

## 2023-07-29 ENCOUNTER — Other Ambulatory Visit (HOSPITAL_BASED_OUTPATIENT_CLINIC_OR_DEPARTMENT_OTHER): Payer: Self-pay

## 2023-07-29 DIAGNOSIS — E785 Hyperlipidemia, unspecified: Secondary | ICD-10-CM | POA: Diagnosis not present

## 2023-07-29 DIAGNOSIS — E559 Vitamin D deficiency, unspecified: Secondary | ICD-10-CM

## 2023-07-29 DIAGNOSIS — I10 Essential (primary) hypertension: Secondary | ICD-10-CM

## 2023-07-29 LAB — LIPID PANEL
Cholesterol: 220 mg/dL — ABNORMAL HIGH (ref 0–200)
HDL: 52.9 mg/dL (ref >39.00)
LDL Cholesterol: 137 mg/dL — ABNORMAL HIGH (ref 0–99)
NonHDL: 166.92
Total CHOL/HDL Ratio: 4
Triglycerides: 148 mg/dL (ref 0.0–149.0)
VLDL: 29.6 mg/dL (ref 0.0–40.0)

## 2023-07-29 LAB — CBC WITH DIFFERENTIAL/PLATELET
Basophils Absolute: 0 10*3/uL (ref 0.0–0.1)
Basophils Relative: 0.9 % (ref 0.0–3.0)
Eosinophils Absolute: 0.3 10*3/uL (ref 0.0–0.7)
Eosinophils Relative: 4.4 % (ref 0.0–5.0)
HCT: 36.6 % (ref 36.0–46.0)
Hemoglobin: 12.6 g/dL (ref 12.0–15.0)
Lymphocytes Relative: 35.9 % (ref 12.0–46.0)
Lymphs Abs: 2.1 10*3/uL (ref 0.7–4.0)
MCHC: 34.4 g/dL (ref 30.0–36.0)
MCV: 88 fl (ref 78.0–100.0)
Monocytes Absolute: 0.4 10*3/uL (ref 0.1–1.0)
Monocytes Relative: 7.2 % (ref 3.0–12.0)
Neutro Abs: 3 10*3/uL (ref 1.4–7.7)
Neutrophils Relative %: 51.6 % (ref 43.0–77.0)
Platelets: 242 10*3/uL (ref 150.0–400.0)
RBC: 4.15 Mil/uL (ref 3.87–5.11)
RDW: 13.8 % (ref 11.5–15.5)
WBC: 5.8 10*3/uL (ref 4.0–10.5)

## 2023-07-29 LAB — BASIC METABOLIC PANEL WITH GFR
BUN: 11 mg/dL (ref 6–23)
CO2: 28 meq/L (ref 19–32)
Calcium: 9.2 mg/dL (ref 8.4–10.5)
Chloride: 103 meq/L (ref 96–112)
Creatinine, Ser: 0.49 mg/dL (ref 0.40–1.20)
GFR: 98.63 mL/min (ref >60.00)
Glucose, Bld: 98 mg/dL (ref 70–99)
Potassium: 4.1 meq/L (ref 3.5–5.1)
Sodium: 139 meq/L (ref 135–145)

## 2023-07-29 LAB — ALT: ALT: 44 U/L — ABNORMAL HIGH (ref 0–35)

## 2023-07-29 LAB — VITAMIN D 25 HYDROXY (VIT D DEFICIENCY, FRACTURES): VITD: 26.2 ng/mL — ABNORMAL LOW (ref 30.00–100.00)

## 2023-07-29 LAB — AST: AST: 33 U/L (ref 0–37)

## 2023-07-29 MED ORDER — LOSARTAN POTASSIUM 50 MG PO TABS
50.0000 mg | ORAL_TABLET | Freq: Every day | ORAL | 1 refills | Status: DC
  Filled 2023-07-29: qty 90, 90d supply, fill #0
  Filled 2023-10-22: qty 90, 90d supply, fill #1

## 2023-07-29 NOTE — Addendum Note (Signed)
 Addended by: Conrad [place] D on: 07/29/2023 08:38 AM   Modules accepted: Orders

## 2023-08-01 ENCOUNTER — Encounter: Payer: Self-pay | Admitting: Internal Medicine

## 2023-08-01 ENCOUNTER — Ambulatory Visit (INDEPENDENT_AMBULATORY_CARE_PROVIDER_SITE_OTHER): Payer: Medicare (Managed Care) | Admitting: Internal Medicine

## 2023-08-01 ENCOUNTER — Other Ambulatory Visit (HOSPITAL_BASED_OUTPATIENT_CLINIC_OR_DEPARTMENT_OTHER): Payer: Self-pay

## 2023-08-01 VITALS — BP 128/80 | HR 60 | Temp 98.5°F | Resp 16 | Ht 59.0 in | Wt 164.4 lb

## 2023-08-01 DIAGNOSIS — I10 Essential (primary) hypertension: Secondary | ICD-10-CM | POA: Diagnosis not present

## 2023-08-01 DIAGNOSIS — E559 Vitamin D deficiency, unspecified: Secondary | ICD-10-CM

## 2023-08-01 MED ORDER — VITAMIN D (ERGOCALCIFEROL) 1.25 MG (50000 UNIT) PO CAPS
50000.0000 [IU] | ORAL_CAPSULE | ORAL | 0 refills | Status: AC
  Filled 2023-08-01: qty 12, 84d supply, fill #0

## 2023-08-01 NOTE — Progress Notes (Signed)
 Subjective:    Patient ID: Lauren Hodge, female    DOB: 09/08/1955, 68 y.o.   MRN: [id number]  DOS:  08/01/2023 Type of visit - description: Routine follow-up  Chronic medical problems addressed, recent labs reviewed. Feels well.   Review of Systems See above   Past Medical History:  Diagnosis Date   Blood transfusion without reported diagnosis    Breast mass    Cancer [facility])    Carpal tunnel syndrome    Family history of prostate cancer    Fracture of carpal bone 2018   bilateral   GERD (gastroesophageal reflux disease)    Hemorrhoids    Hiatal hernia 02/2019   seen on EGD    History of left breast biopsy    Benign   HTN (hypertension)    Hyperlipidemia    Kidney stones    Osteoporosis    Osteoporosis    Vitamin D deficiency     Past Surgical History:  Procedure Laterality Date   BIOPSY THYROID     BREAST BIOPSY Left 2010   x2, 2010-2012 (no cancer)   BREAST BIOPSY Right 01/10/2023   [place] RT BREAST BX W LOC DEV 1ST LESION IMG BX SPEC US GUIDE 01/10/2023 [facility]   BREAST BIOPSY Right 01/10/2023   MM RT BREAST BX W LOC DEV 1ST LESION IMAGE BX SPEC STEREO GUIDE 01/10/2023 [facility]   BREAST BIOPSY Right 01/22/2023   MM RT BREAST BX W LOC DEV 1ST LESION IMAGE BX SPEC STEREO GUIDE 01/22/2023 [facility]   BREAST BIOPSY Right 01/22/2023   MM RT BREAST BX W LOC DEV EA AD LESION IMG BX SPEC STEREO GUIDE 01/22/2023 [facility]   BREAST BIOPSY Left 01/22/2023   MM LT BREAST BX W LOC DEV 1ST LESION IMAGE BX SPEC STEREO GUIDE 01/22/2023 [facility]   BREAST EXCISIONAL BIOPSY Left    CHOLECYSTECTOMY  2015   HEMORRHOIDECTOMY WITH HEMORRHOID BANDING  04/22/2017   SIMPLE MASTECTOMY WITH AXILLARY SENTINEL NODE BIOPSY Bilateral 03/05/2023   Procedure: BILATERAL MASTECTOMIES WITH BILATERAL MAGTRACE INJECTIONS;  Surgeon: Almond Lint, MD;  Location: [facility];  Service: General;  Laterality: Bilateral;  PEC BLOCK     Current Outpatient Medications  Medication Instructions   losartan (COZAAR) 50 mg, Oral, Daily   metoprolol succinate (TOPROL-XL) 25 mg, Oral, Daily, Take with or immediately following a meal   Multiple Vitamins-Minerals (CENTRUM ADULTS PO) Take by mouth.   pantoprazole (PROTONIX) 20 mg, Oral, Daily before breakfast   Vitamin D (Ergocalciferol) (DRISDOL) 50,000 Units, Oral, Every 7 days   VITAMIN D, CHOLECALCIFEROL, PO Take by mouth.       Objective:   Physical Exam BP 128/80   Pulse 60   Temp 98.5 F (36.9 C) (Oral)   Resp 16   Ht 4\' 11"  (1.499 m)   Wt 164 lb 6 oz (74.6 kg)   SpO2 97%   BMI 33.20 kg/m  General:   Well developed, NAD, BMI noted. HEENT:  Normocephalic . Face symmetric, atraumatic Lungs:  CTA B Normal respiratory effort, no intercostal retractions, no accessory muscle use. Heart: RRR,  no murmur.  Lower extremities: no pretibial edema bilaterally  Skin: Not pale. Not jaundice Neurologic:  alert & oriented X3.  Speech normal, gait appropriate for age and unassisted Psych--  Cognition and judgment appear intact.  Cooperative with normal attention span and concentration.  Behavior appropriate. No anxious or depressed appearing.      Assessment  ASSESSMENT. new patient 08/2020 HTN  amlodipine: Intolerant palpation, headache, edema.  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  US liver 2022: Likely steatosis Breast cancer: Bilateral mastectomy 03/05/2023.  PLAN Labs 07/29/23 reviewed: CBC BMP AST normal.  ALT minimally elevated. Vitamin D low at 26. LDL 137, increased HTN:On metoprolol, losartan, self check  BP a month ago: 150s.  No  other readings.  BP today is very good. Plan: No change, check BP 2-3 times a week, proper techniques and goals discussed. See AVS. Vitamin D deficiency: Last vitamin D is low, continue OTC, at least 2000 units daily, ergocalciferol for 3 months.  Recheck on RTC. Cardiovascular risk 8%: Last LDL 137, watch diet closely. Preventive care: Colonoscopy March 2025, next 5 years per pathology report. RTC 4 months

## 2023-08-01 NOTE — Patient Instructions (Addendum)
 Vitamin D3 over-the-counter: 2000 units daily  Ergocalciferol 50,000 units: 1 tablet every week for 3 months.   Check the  blood pressure regularly, 2-3 times a week. Rest, drink a glass of water, sit down, then check the blood pressure Blood pressure goal:  between 110/65 and  135/85. If it is consistently higher or lower, let me know   Please go to the front desk: Arrange a follow-up in 4 months

## 2023-08-01 NOTE — Assessment & Plan Note (Signed)
 Labs 07/29/23 reviewed: CBC BMP AST normal.  ALT minimally elevated. Vitamin D low at 26. LDL 137, increased HTN:On metoprolol, losartan, self check  BP a month ago: 150s.  No other readings.  BP today is very good. Plan: No change, check BP 2-3 times a week, proper techniques and goals discussed. See AVS. Vitamin D deficiency: Last vitamin D is low, continue OTC, at least 2000 units daily, ergocalciferol for 3 months.  Recheck on RTC. Cardiovascular risk 8%: Last LDL 137, watch diet closely. Preventive care: Colonoscopy March 2025, next 5 years per pathology report. RTC 4 months

## 2023-08-06 ENCOUNTER — Ambulatory Visit: Payer: BC Managed Care – PPO | Admitting: Internal Medicine

## 2023-08-11 ENCOUNTER — Ambulatory Visit: Payer: Medicare (Managed Care) | Admitting: Internal Medicine

## 2023-08-11 ENCOUNTER — Encounter: Payer: Self-pay | Admitting: Internal Medicine

## 2023-08-11 VITALS — BP 148/70 | HR 71 | Resp 16 | Ht 59.0 in | Wt 162.8 lb

## 2023-08-11 DIAGNOSIS — E042 Nontoxic multinodular goiter: Secondary | ICD-10-CM | POA: Diagnosis not present

## 2023-08-11 LAB — T4, FREE: Free T4: 1.2 ng/dL (ref 0.8–1.8)

## 2023-08-11 LAB — TSH: TSH: 1.87 m[IU]/L (ref 0.40–4.50)

## 2023-08-11 NOTE — Progress Notes (Signed)
 Name: Lauren Hodge  MRN/ DOB: [id number], May 07, 1955    Age/ Sex: 68 y.o., female    PCP: Ezell Hollow, MD   Reason for Endocrinology Evaluation: MNG     Date of Initial Endocrinology Evaluation: 09/19/2020    HPI: [name] is a 68 y.o. female with a past medical history of HTN.  The patient presented for initial endocrinology clinic visit on 09/19/2020 for consultative assistance with her MNG .    She has been noted with left thyroid  nodule on physician exam prompting a thyroid  ultrasound  (08/2020)which showed multinodular goiter with a left thyroid  nodule 1 cm meeting criteria for FNA .  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 is here for a follow up on MNG.   Since her last visit here she was diagnosed with right breast cancer September, 2024, following a spontaneous bloody nipple discharge.  She is S/P bilateral mastectomies 02/2023 no invasive cancer was seen.  Denies local neck swelling  Has occasional palpitations  Denies diarrhea  bur has occasional constipation  Denies hand tremors     HISTORY:  Past Medical History:  Past Medical History:  Diagnosis Date   Blood transfusion without reported diagnosis    Breast mass    Cancer (HCC)    Carpal tunnel syndrome    Family history of prostate cancer    Fracture of carpal bone 2018   bilateral   GERD (gastroesophageal reflux disease)    Hemorrhoids    Hiatal hernia 02/2019   seen on EGD    History of left breast biopsy    Benign   HTN (hypertension)    Hyperlipidemia    Kidney stones    Osteoporosis    Osteoporosis    Vitamin D  deficiency    Past Surgical History:  Past Surgical History:  Procedure Laterality Date   BIOPSY THYROID      BREAST BIOPSY Left 2010   x2, 2010-2012 (no cancer)   BREAST  BIOPSY Right 01/10/2023   US  RT BREAST BX W LOC DEV 1ST LESION IMG BX SPEC US  GUIDE 01/10/2023 [facility]   BREAST BIOPSY Right 01/10/2023   MM RT BREAST BX W LOC DEV 1ST LESION IMAGE BX SPEC STEREO GUIDE 01/10/2023 [facility]   BREAST BIOPSY Right 01/22/2023   MM RT BREAST BX W LOC DEV 1ST LESION IMAGE BX SPEC STEREO GUIDE 01/22/2023 [facility]   BREAST BIOPSY Right 01/22/2023   MM RT BREAST BX W LOC DEV EA AD LESION IMG BX SPEC STEREO GUIDE 01/22/2023 [facility]   BREAST BIOPSY Left 01/22/2023   MM LT BREAST BX W LOC DEV 1ST LESION IMAGE BX SPEC STEREO GUIDE 01/22/2023 [facility]   BREAST EXCISIONAL BIOPSY Left    CHOLECYSTECTOMY  2015   HEMORRHOIDECTOMY WITH HEMORRHOID BANDING  04/22/2017   SIMPLE MASTECTOMY WITH AXILLARY SENTINEL NODE BIOPSY Bilateral 03/05/2023   Procedure: BILATERAL MASTECTOMIES WITH BILATERAL MAGTRACE INJECTIONS;  Surgeon: Lockie Rima, MD;  Location: [facility];  Service: General;  Laterality: Bilateral;  PEC BLOCK    Social History:  reports that she quit smoking about 4 years ago. Her smoking use included cigarettes. She has never used smokeless tobacco. She reports that she does not currently use alcohol. She reports that she does  not use drugs. Family History: family history includes Cervical cancer in her maternal grandmother; Hypertension in her mother; Prostate cancer in her father; Tongue cancer in her maternal aunt.   HOME MEDICATIONS: Allergies as of 08/11/2023   No Known Allergies      Medication List        Accurate as of August 11, 2023  7:04 AM. If you have any questions, ask your nurse or doctor.          CENTRUM ADULTS PO Take by mouth.   losartan  50 MG tablet Commonly known as: COZAAR  Tome 1 tableta (50 mg en total) por va oral diariamente. (Take 1 tablet (50 mg total) by mouth daily.)   metoprolol  succinate 25 MG 24 hr tablet Commonly known as: TOPROL -XL Take 1 tablet (25 mg  total) by mouth daily. Take with or immediately following a meal   pantoprazole  20 MG tablet Commonly known as: PROTONIX  Tome 1 tableta (20 mg en total) por va oral diariamente antes de desayunar. (Take 1 tablet (20 mg total) by mouth daily before breakfast.)   VITAMIN D  (CHOLECALCIFEROL ) PO Take by mouth.   Vitamin D  (Ergocalciferol ) 1.25 MG (50000 UNIT) Caps capsule Commonly known as: DRISDOL  Take 1 capsule (50,000 Units total) by mouth every 7 (seven) days.          REVIEW OF SYSTEMS: A comprehensive ROS was conducted with the patient and is negative except as per HPI   OBJECTIVE:  VS: There were no vitals taken for this visit.   Wt Readings from Last 3 Encounters:  08/01/23 164 lb 6 oz (74.6 kg)  06/27/23 162 lb (73.5 kg)  06/13/23 162 lb (73.5 kg)     EXAM: General: Pt appears well and is in NAD  Neck: General: Supple without adenopathy. Thyroid : Thyroid  size normal.  Right thyroid   nodule appreciated, I am unable to appreciate left nodule on today's exam.   Lungs: Clear with good BS bilat   Heart: Auscultation: RRR.  Abdomen: soft, nontender  Extremities:  BL LE: No pretibial edema   Mental Status: Judgment, insight: Intact Orientation: Oriented to time, place, and person Mood and affect: No depression, anxiety, or agitation     DATA REVIEWED:  Latest Reference Range & Units 08/11/23 09:28  TSH 0.40 - 4.50 mIU/L 1.87  T4,Free(Direct) 0.8 - 1.8 ng/dL 1.2     Thyroid  Ultrasound 08/07/2022 Estimated total number of nodules >/= 1 cm: 3   Number of spongiform nodules >/=  2 cm not described below (TR1): 0   Number of mixed cystic and solid nodules >/= 1.5 cm not described below (TR2): 0   _________________________________________________________   Small isoechoic solid nodules are again noted in the right mid and lower gland. These lesions remain stable in do not meet the size criteria for further evaluation.   Nodule # 3: The previously biopsied  nodule in the left lower gland remains grossly unchanged at 1.1 x 1.1 x 1.1 cm.   No new nodules or suspicious features.   IMPRESSION: No significant interval change in the size or appearance of the previously biopsied nodule in the left lower gland.   No new nodules or suspicious features.       FNA 09/06/2020   Clinical History: Left; Inferior 1.0cm; Other 2 dimensions: 0.9cm, Solid  / almost completely solid, Hypoechoic, TI-RADS total points 7.  Specimen Submitted:  A. 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 Afirma was benign  - Ultrasound last year was stable, will continue to monitor with annual ultrasounds for a total of 5 years - Annual ultrasound order has been placed   F/U in 1 yr    Signed electronically by: Natale Bail, MD  [facility]  [facility] [address] [place] [place], [place] [address] Phone: [phone] FAX: [phone]   CC: Ezell Hollow, MD [address] [place] [address] Phone: [phone] Fax: [phone]   Return to Endocrinology clinic as below: Future Appointments  Date Time Provider Department Center  08/11/2023  9:30 AM Efraim Vanallen, Julian Obey, MD [facility] None  08/18/2023  8:00 AM Suzen Escort, PTA [facility] None  12/02/2023  8:40 AM Ezell Hollow, MD [facility]  03/29/2024  8:45 AM Murleen Arms, MD [facility] None

## 2023-08-12 ENCOUNTER — Encounter: Payer: Self-pay | Admitting: Internal Medicine

## 2023-08-18 ENCOUNTER — Ambulatory Visit: Payer: Medicare (Managed Care) | Attending: General Surgery

## 2023-08-18 VITALS — Wt 162.4 lb

## 2023-08-18 DIAGNOSIS — Z483 Aftercare following surgery for neoplasm: Secondary | ICD-10-CM | POA: Insufficient documentation

## 2023-08-18 NOTE — Therapy (Signed)
 OUTPATIENT PHYSICAL THERAPY SOZO SCREENING NOTE   Patient Name: Lauren Hodge MRN: [id number] DOB:[birth year], 68 y.o., female Today's Date: 08/18/2023  PCP: Ezell Hollow, MD REFERRING PROVIDER: Lockie Rima, MD   PT End of Session - 08/18/23 0759     Visit Number 13    PT Start Time 0756    PT Stop Time 0803    PT Time Calculation (min) 7 min    Activity Tolerance Patient tolerated treatment well    Behavior During Therapy [facility] for tasks assessed/performed             Past Medical History:  Diagnosis Date   Blood transfusion without reported diagnosis    Breast mass    Cancer (HCC)    Carpal tunnel syndrome    Family history of prostate cancer    Fracture of carpal bone 2018   bilateral   GERD (gastroesophageal reflux disease)    Hemorrhoids    Hiatal hernia 02/2019   seen on EGD    History of left breast biopsy    Benign   HTN (hypertension)    Hyperlipidemia    Kidney stones    Osteoporosis    Osteoporosis    Vitamin D  deficiency    Past Surgical History:  Procedure Laterality Date   BIOPSY THYROID      BREAST BIOPSY Left 2010   x2, 2010-2012 (no cancer)   BREAST BIOPSY Right 01/10/2023   US  RT BREAST BX W LOC DEV 1ST LESION IMG BX SPEC US  GUIDE 01/10/2023 [facility]   BREAST BIOPSY Right 01/10/2023   MM RT BREAST BX W LOC DEV 1ST LESION IMAGE BX SPEC STEREO GUIDE 01/10/2023 [facility]   BREAST BIOPSY Right 01/22/2023   MM RT BREAST BX W LOC DEV 1ST LESION IMAGE BX SPEC STEREO GUIDE 01/22/2023 [facility]   BREAST BIOPSY Right 01/22/2023   MM RT BREAST BX W LOC DEV EA AD LESION IMG BX SPEC STEREO GUIDE 01/22/2023 [facility]   BREAST BIOPSY Left 01/22/2023   MM LT BREAST BX W LOC DEV 1ST LESION IMAGE BX SPEC STEREO GUIDE 01/22/2023 [facility]   BREAST EXCISIONAL BIOPSY Left    CHOLECYSTECTOMY  2015   HEMORRHOIDECTOMY WITH HEMORRHOID BANDING  04/22/2017   SIMPLE MASTECTOMY WITH AXILLARY SENTINEL NODE BIOPSY  Bilateral 03/05/2023   Procedure: BILATERAL MASTECTOMIES WITH BILATERAL MAGTRACE INJECTIONS;  Surgeon: Lockie Rima, MD;  Location: [facility];  Service: General;  Laterality: Bilateral;  PEC BLOCK   Patient Active Problem List   Diagnosis Date Noted   Bilateral breast cancer (HCC) 03/05/2023   Family history of prostate cancer    Genetic testing 02/07/2023   Ductal carcinoma in situ (DCIS) of right breast 01/21/2023   Malignant neoplasm of central portion of female breast (HCC) 01/14/2023   Trigger finger, right middle finger 09/11/2021   Annual physical exam 10/31/2020   PCP NOTES >>>>>>>>>>>>>>>>>> 08/26/2020   GERD (gastroesophageal reflux disease) 07/26/2020   Hyperlipidemia 10/06/2019   Vitamin D  deficiency 06/18/2018   Hypertensive disorder 05/18/2018    REFERRING DIAG: right breast cancer at risk for lymphedema  THERAPY DIAG:  Aftercare following surgery for neoplasm  PERTINENT HISTORY: 03/05/23: bilateral mastectomies and R SLNB,on L breast cancer: DCIS, grade 2, greatest dimension: 2.5 mm, ER: 95%, positive, PR: 60%. R breast cancer: DCIS grade 2, greatest dimension 7 mm, ER: 100%, positive, PR: 85%, positive, SLNB 0/4 on R, high blood pressure   PRECAUTIONS: right UE Lymphedema risk, None  SUBJECTIVE:  Pt returns for her first 3 month L-screen.   PAIN:  Are you having pain? No   SOZO SCREENING: Patient was assessed today using the SOZO machine to determine the lymphedema index score. This was compared to her baseline score. It was determined that she is within the recommended range when compared to her baseline and no further action is needed at this time. She will continue SOZO screenings. These are done every 3 months for 2 years post operatively followed by every 6 months for 2 years, and then annually.   L-DEX FLOWSHEETS - 08/18/23 0800       L-DEX LYMPHEDEMA SCREENING   Measurement Type Unilateral    L-DEX MEASUREMENT EXTREMITY Upper Extremity     POSITION  Standing    DOMINANT SIDE Right    At Risk Side Right    BASELINE SCORE (UNILATERAL) 2.7    L-DEX SCORE (UNILATERAL) 1    VALUE CHANGE (UNILAT) -1.7               Denyce Flank, PTA 08/18/2023, 8:04 AM

## 2023-08-21 ENCOUNTER — Ambulatory Visit (HOSPITAL_BASED_OUTPATIENT_CLINIC_OR_DEPARTMENT_OTHER): Admission: RE | Admit: 2023-08-21 | Payer: Medicare (Managed Care) | Source: Ambulatory Visit

## 2023-08-27 ENCOUNTER — Ambulatory Visit (HOSPITAL_BASED_OUTPATIENT_CLINIC_OR_DEPARTMENT_OTHER)
Admission: RE | Admit: 2023-08-27 | Discharge: 2023-08-27 | Disposition: A | Discharge status: Home / Self Care | Payer: Medicare (Managed Care) | Source: Ambulatory Visit | Attending: Internal Medicine | Admitting: Internal Medicine

## 2023-08-27 DIAGNOSIS — E042 Nontoxic multinodular goiter: Secondary | ICD-10-CM | POA: Insufficient documentation

## 2023-09-01 ENCOUNTER — Encounter: Payer: Self-pay | Admitting: Internal Medicine

## 2023-09-05 ENCOUNTER — Other Ambulatory Visit: Payer: Self-pay | Admitting: Internal Medicine

## 2023-09-05 ENCOUNTER — Other Ambulatory Visit (HOSPITAL_BASED_OUTPATIENT_CLINIC_OR_DEPARTMENT_OTHER): Payer: Self-pay

## 2023-09-05 MED ORDER — METOPROLOL SUCCINATE ER 25 MG PO TB24
25.0000 mg | ORAL_TABLET | Freq: Every day | ORAL | 1 refills | Status: DC
  Filled 2023-09-05: qty 90, 90d supply, fill #0
  Filled 2023-12-02: qty 90, 90d supply, fill #1

## 2023-11-17 ENCOUNTER — Ambulatory Visit: Payer: Medicare (Managed Care)

## 2023-12-01 ENCOUNTER — Ambulatory Visit: Payer: Medicare (Managed Care) | Attending: General Surgery

## 2023-12-01 VITALS — Wt 166.1 lb

## 2023-12-01 DIAGNOSIS — Z483 Aftercare following surgery for neoplasm: Secondary | ICD-10-CM | POA: Insufficient documentation

## 2023-12-01 NOTE — Therapy (Signed)
 OUTPATIENT PHYSICAL THERAPY SOZO SCREENING NOTE   Patient Name: Lauren Hodge MRN: [id number] DOB:04-16-1956, 68 y.o., female Today's Date: 12/01/2023  PCP: Amon Aloysius BRAVO, MD REFERRING PROVIDER: Aron Shoulders, MD   PT End of Session - 12/01/23 0840     Visit Number 13   # unchanged due to screen only   PT Start Time 0838    PT Stop Time 0842    PT Time Calculation (min) 4 min    Activity Tolerance Patient tolerated treatment well    Behavior During Therapy WFL for tasks assessed/performed          Past Medical History:  Diagnosis Date   Blood transfusion without reported diagnosis    Breast mass    Cancer (HCC)    Carpal tunnel syndrome    Family history of prostate cancer    Fracture of carpal bone 2018   bilateral   GERD (gastroesophageal reflux disease)    Hemorrhoids    Hiatal hernia 02/2019   seen on EGD    History of left breast biopsy    Benign   HTN (hypertension)    Hyperlipidemia    Kidney stones    Osteoporosis    Osteoporosis    Vitamin D  deficiency    Past Surgical History:  Procedure Laterality Date   BIOPSY THYROID      BREAST BIOPSY Left 2010   x2, 2010-2012 (no cancer)   BREAST BIOPSY Right 01/10/2023   US  RT BREAST BX W LOC DEV 1ST LESION IMG BX SPEC US  GUIDE 01/10/2023 [facility]   BREAST BIOPSY Right 01/10/2023   MM RT BREAST BX W LOC DEV 1ST LESION IMAGE BX SPEC STEREO GUIDE 01/10/2023 [facility]   BREAST BIOPSY Right 01/22/2023   MM RT BREAST BX W LOC DEV 1ST LESION IMAGE BX SPEC STEREO GUIDE 01/22/2023 [facility]   BREAST BIOPSY Right 01/22/2023   MM RT BREAST BX W LOC DEV EA AD LESION IMG BX SPEC STEREO GUIDE 01/22/2023 [facility]   BREAST BIOPSY Left 01/22/2023   MM LT BREAST BX W LOC DEV 1ST LESION IMAGE BX SPEC STEREO GUIDE 01/22/2023 [facility]   BREAST EXCISIONAL BIOPSY Left    CHOLECYSTECTOMY  2015   HEMORRHOIDECTOMY WITH HEMORRHOID BANDING  04/22/2017   SIMPLE MASTECTOMY WITH AXILLARY  SENTINEL NODE BIOPSY Bilateral 03/05/2023   Procedure: BILATERAL MASTECTOMIES WITH BILATERAL MAGTRACE INJECTIONS;  Surgeon: Aron Shoulders, MD;  Location: [facility];  Service: General;  Laterality: Bilateral;  PEC BLOCK   Patient Active Problem List   Diagnosis Date Noted   Bilateral breast cancer (HCC) 03/05/2023   Family history of prostate cancer    Genetic testing 02/07/2023   Ductal carcinoma in situ (DCIS) of right breast 01/21/2023   Malignant neoplasm of central portion of female breast (HCC) 01/14/2023   Trigger finger, right middle finger 09/11/2021   Annual physical exam 10/31/2020   PCP NOTES >>>>>>>>>>>>>>>>>> 08/26/2020   GERD (gastroesophageal reflux disease) 07/26/2020   Hyperlipidemia 10/06/2019   Vitamin D  deficiency 06/18/2018   Hypertensive disorder 05/18/2018    REFERRING DIAG: right breast cancer at risk for lymphedema  THERAPY DIAG:  Aftercare following surgery for neoplasm  PERTINENT HISTORY: 03/05/23: bilateral mastectomies and R SLNB,on L breast cancer: DCIS, grade 2, greatest dimension: 2.5 mm, ER: 95%, positive, PR: 60%. R breast cancer: DCIS grade 2, greatest dimension 7 mm, ER: 100%, positive, PR: 85%, positive, SLNB 0/4 on R, high blood pressure   PRECAUTIONS: right UE Lymphedema  risk, None  SUBJECTIVE: Pt returns for her 3 month L-screen.   PAIN:  Are you having pain? No   SOZO SCREENING: Patient was assessed today using the SOZO machine to determine the lymphedema index score. This was compared to her baseline score. It was determined that she is within the recommended range when compared to her baseline and no further action is needed at this time. She will continue SOZO screenings. These are done every 3 months for 2 years post operatively followed by every 6 months for 2 years, and then annually.   L-DEX FLOWSHEETS - 12/01/23 0800       L-DEX LYMPHEDEMA SCREENING   Measurement Type Unilateral    L-DEX MEASUREMENT EXTREMITY  Upper Extremity    DOMINANT SIDE Right    At Risk Side Right    BASELINE SCORE (UNILATERAL) 2.7    L-DEX SCORE (UNILATERAL) 1.3    VALUE CHANGE (UNILAT) -1.4            Aden Berwyn Caldron, PTA 12/01/2023, 8:42 AM

## 2023-12-02 ENCOUNTER — Ambulatory Visit (INDEPENDENT_AMBULATORY_CARE_PROVIDER_SITE_OTHER): Payer: Medicare (Managed Care) | Admitting: Internal Medicine

## 2023-12-02 ENCOUNTER — Other Ambulatory Visit (HOSPITAL_BASED_OUTPATIENT_CLINIC_OR_DEPARTMENT_OTHER): Payer: Self-pay

## 2023-12-02 ENCOUNTER — Encounter: Payer: Self-pay | Admitting: Internal Medicine

## 2023-12-02 VITALS — BP 136/86 | HR 62 | Temp 98.2°F | Resp 16 | Ht 59.0 in | Wt 165.4 lb

## 2023-12-02 DIAGNOSIS — I10 Essential (primary) hypertension: Secondary | ICD-10-CM

## 2023-12-02 DIAGNOSIS — M810 Age-related osteoporosis without current pathological fracture: Secondary | ICD-10-CM

## 2023-12-02 DIAGNOSIS — E559 Vitamin D deficiency, unspecified: Secondary | ICD-10-CM | POA: Diagnosis not present

## 2023-12-02 MED ORDER — LOSARTAN POTASSIUM 100 MG PO TABS
100.0000 mg | ORAL_TABLET | Freq: Every day | ORAL | 1 refills | Status: AC
  Filled 2023-12-02: qty 90, 90d supply, fill #0
  Filled 2024-03-01: qty 90, 90d supply, fill #1

## 2023-12-02 NOTE — Progress Notes (Signed)
 Subjective:    Patient ID: Lauren Hodge, female    DOB: Mar 30, 1956, 68 y.o.   MRN: [id number]  DOS:  12/02/2023 Type of visit - description: Follow-up Chronic medical problems addressed. Feels well. BPs are elevated at home BP Readings from Last 3 Encounters:  12/02/23 136/86  08/11/23 (!) 148/70  08/01/23 128/80     Review of Systems See above   Past Medical History:  Diagnosis Date   Blood transfusion without reported diagnosis    Breast mass    Cancer (HCC)    Carpal tunnel syndrome    Family history of prostate cancer    Fracture of carpal bone 2018   bilateral   GERD (gastroesophageal reflux disease)    Hemorrhoids    Hiatal hernia 02/2019   seen on EGD    History of left breast biopsy    Benign   HTN (hypertension)    Hyperlipidemia    Kidney stones    Osteoporosis    Osteoporosis    Vitamin D  deficiency     Past Surgical History:  Procedure Laterality Date   BIOPSY THYROID      BREAST BIOPSY Left 2010   x2, 2010-2012 (no cancer)   BREAST BIOPSY Right 01/10/2023   US  RT BREAST BX W LOC DEV 1ST LESION IMG BX SPEC US  GUIDE 01/10/2023 [facility]   BREAST BIOPSY Right 01/10/2023   MM RT BREAST BX W LOC DEV 1ST LESION IMAGE BX SPEC STEREO GUIDE 01/10/2023 [facility]   BREAST BIOPSY Right 01/22/2023   MM RT BREAST BX W LOC DEV 1ST LESION IMAGE BX SPEC STEREO GUIDE 01/22/2023 [facility]   BREAST BIOPSY Right 01/22/2023   MM RT BREAST BX W LOC DEV EA AD LESION IMG BX SPEC STEREO GUIDE 01/22/2023 [facility]   BREAST BIOPSY Left 01/22/2023   MM LT BREAST BX W LOC DEV 1ST LESION IMAGE BX SPEC STEREO GUIDE 01/22/2023 [facility]   BREAST EXCISIONAL BIOPSY Left    CHOLECYSTECTOMY  2015   HEMORRHOIDECTOMY WITH HEMORRHOID BANDING  04/22/2017   SIMPLE MASTECTOMY WITH AXILLARY SENTINEL NODE BIOPSY Bilateral 03/05/2023   Procedure: BILATERAL MASTECTOMIES WITH BILATERAL MAGTRACE INJECTIONS;  Surgeon: Aron Shoulders, MD;  Location:  [facility];  Service: General;  Laterality: Bilateral;  PEC BLOCK    Current Outpatient Medications  Medication Instructions   losartan  (COZAAR ) 50 mg, Oral, Daily   metoprolol  succinate (TOPROL -XL) 25 mg, Oral, Daily, Take with or immediately following a meal   Multiple Vitamins-Minerals (CENTRUM ADULTS PO) Take by mouth.   pantoprazole  (PROTONIX ) 20 mg, Oral, Daily before breakfast   VITAMIN D , CHOLECALCIFEROL , PO Take by mouth.       Objective:   Physical Exam BP 136/86   Pulse 62   Temp 98.2 F (36.8 C) (Oral)   Resp 16   Ht 4' 11 (1.499 m)   Wt 165 lb 6 oz (75 kg)   SpO2 96%   BMI 33.40 kg/m  General:   Well developed, NAD, BMI noted. HEENT:  Normocephalic . Face symmetric, atraumatic Lungs:  CTA B Normal respiratory effort, no intercostal retractions, no accessory muscle use. Heart: RRR,  no murmur.  Lower extremities: no pretibial edema bilaterally  Skin: Not pale. Not jaundice Neurologic:  alert & oriented X3.  Speech normal, gait appropriate for age and unassisted Psych--  Cognition and judgment appear intact.  Cooperative with normal attention span and concentration.  Behavior appropriate. No anxious or depressed appearing.  Assessment    ASSESSMENT. new patient 08/2020 HTN  amlodipine : Intolerant palpation, headache, edema.   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   -US  liver 2022: Likely steatosis - Hep A&B serology neg 2022 Breast cancer: Bilateral mastectomy 03/05/2023.  PLAN HTN: BP today is okay, at home is consistently 150, 160.  Good med compliance. Plan:  Increase losartan  from 50 mg to 100 mg, continue  metoprolol . Nurse visit in 2 weeks: Needs BP check here, she will also bring her own cuff for comparison, will need a BMP.  Vitamin D  deficiency: Reports she is taking approximately 2000 units daily, check levels Osteoporosis: Previously declined at treatment, she is more accepting of medication at this point, recheck DEXA. Preventive care: Flu and COVID booster this fall All instructions printed in English and discussed in Spanish RTC nurse visit 2 weeks RTC CPX 3 to 4 months

## 2023-12-02 NOTE — Patient Instructions (Signed)
 Increase losartan  to 100 mg every day. Other medications the same   Go to the front desk: Arrange for nurse visit in 2 weeks Arrange for a physical exam in 3 to 4 months  When you come back for the nurse visit, bring your home blood pressure cuff and your blood pressure readings.    STOP BY THE FIRST FLOOR: Schedule a bone density test

## 2023-12-03 DIAGNOSIS — M810 Age-related osteoporosis without current pathological fracture: Secondary | ICD-10-CM | POA: Insufficient documentation

## 2023-12-03 NOTE — Assessment & Plan Note (Signed)
 HTN: BP today is okay, at home is consistently 150, 160.  Good med compliance. Plan:  Increase losartan  from 50 mg to 100 mg, continue metoprolol . Nurse visit in 2 weeks: Needs BP check here, she will also bring her own cuff for comparison, will need a BMP.  Vitamin D  deficiency: Reports she is taking approximately 2000 units daily, check levels Osteoporosis: Previously declined at treatment, she is more accepting of medication at this point, recheck DEXA. Preventive care: Flu and COVID booster this fall All instructions printed in English and discussed in Spanish RTC nurse visit 2 weeks RTC CPX 3 to 4 months

## 2023-12-12 ENCOUNTER — Other Ambulatory Visit (HOSPITAL_BASED_OUTPATIENT_CLINIC_OR_DEPARTMENT_OTHER): Payer: Self-pay

## 2023-12-12 DIAGNOSIS — Z170 Estrogen receptor positive status [ER+]: Secondary | ICD-10-CM | POA: Diagnosis not present

## 2023-12-12 DIAGNOSIS — Z9013 Acquired absence of bilateral breasts and nipples: Secondary | ICD-10-CM | POA: Diagnosis not present

## 2023-12-12 DIAGNOSIS — C50112 Malignant neoplasm of central portion of left female breast: Secondary | ICD-10-CM | POA: Diagnosis not present

## 2023-12-12 DIAGNOSIS — R203 Hyperesthesia: Secondary | ICD-10-CM | POA: Diagnosis not present

## 2023-12-12 DIAGNOSIS — C50111 Malignant neoplasm of central portion of right female breast: Secondary | ICD-10-CM | POA: Diagnosis not present

## 2023-12-12 MED ORDER — GABAPENTIN 100 MG PO CAPS
100.0000 mg | ORAL_CAPSULE | Freq: Two times a day (BID) | ORAL | 3 refills | Status: AC | PRN
Start: 2023-12-12 — End: ?
  Filled 2023-12-12: qty 30, 15d supply, fill #0

## 2023-12-17 ENCOUNTER — Other Ambulatory Visit (INDEPENDENT_AMBULATORY_CARE_PROVIDER_SITE_OTHER): Payer: Medicare (Managed Care)

## 2023-12-17 ENCOUNTER — Ambulatory Visit: Payer: Medicare (Managed Care)

## 2023-12-17 ENCOUNTER — Other Ambulatory Visit (HOSPITAL_BASED_OUTPATIENT_CLINIC_OR_DEPARTMENT_OTHER): Payer: Self-pay

## 2023-12-17 DIAGNOSIS — E559 Vitamin D deficiency, unspecified: Secondary | ICD-10-CM

## 2023-12-17 DIAGNOSIS — I10 Essential (primary) hypertension: Secondary | ICD-10-CM

## 2023-12-17 LAB — BASIC METABOLIC PANEL WITH GFR
BUN: 17 mg/dL (ref 6–23)
CO2: 27 meq/L (ref 19–32)
Calcium: 9.1 mg/dL (ref 8.4–10.5)
Chloride: 104 meq/L (ref 96–112)
Creatinine, Ser: 0.58 mg/dL (ref 0.40–1.20)
GFR: 94.45 mL/min (ref >60.00)
Glucose, Bld: 99 mg/dL (ref 70–99)
Potassium: 4.2 meq/L (ref 3.5–5.1)
Sodium: 141 meq/L (ref 135–145)

## 2023-12-17 LAB — VITAMIN D 25 HYDROXY (VIT D DEFICIENCY, FRACTURES): VITD: 36.83 ng/mL (ref 30.00–100.00)

## 2023-12-17 MED ORDER — AMLODIPINE BESYLATE 5 MG PO TABS
5.0000 mg | ORAL_TABLET | Freq: Every day | ORAL | 0 refills | Status: DC
  Filled 2023-12-17 (×2): qty 90, 90d supply, fill #0

## 2023-12-17 NOTE — Progress Notes (Signed)
 Pt here for Blood pressure check per Dr.Paz  Pt currently takes: Losartan  100 mg Metoprolol  25 mg   Pt reports compliance with medication.  BP today @ =162/86 HR =64  Personal machine 172/100  5 minute wait  160/100  Pt advised per PCP to take Norsvasc 5 mg daily , get BMP today and recheck BP in a month for NV

## 2023-12-18 ENCOUNTER — Ambulatory Visit: Payer: Self-pay | Admitting: Internal Medicine

## 2023-12-18 ENCOUNTER — Ambulatory Visit: Payer: Medicare (Managed Care) | Admitting: Medical

## 2023-12-18 ENCOUNTER — Other Ambulatory Visit (HOSPITAL_COMMUNITY): Payer: Self-pay

## 2024-01-12 ENCOUNTER — Ambulatory Visit (HOSPITAL_BASED_OUTPATIENT_CLINIC_OR_DEPARTMENT_OTHER)
Admission: RE | Admit: 2024-01-12 | Discharge: 2024-01-12 | Disposition: A | Discharge status: Home / Self Care | Payer: Medicare (Managed Care) | Source: Ambulatory Visit | Attending: Internal Medicine | Admitting: Internal Medicine

## 2024-01-12 DIAGNOSIS — M810 Age-related osteoporosis without current pathological fracture: Secondary | ICD-10-CM | POA: Diagnosis not present

## 2024-01-13 ENCOUNTER — Encounter: Payer: Self-pay | Admitting: Internal Medicine

## 2024-01-19 ENCOUNTER — Ambulatory Visit: Payer: Medicare (Managed Care)

## 2024-01-19 DIAGNOSIS — I10 Essential (primary) hypertension: Secondary | ICD-10-CM | POA: Diagnosis not present

## 2024-01-19 NOTE — Progress Notes (Signed)
 BP was 162/86 during nurse visit on 12/17/23.    Patient here for BP check per pcp on note from 01/13/24.  Pt advised per PCP to take Norsvasc 5 mg daily , get BMP today and recheck BP in a month for NV.  BP initially is at 150 89 with a pulse of 73 Second BP is at  150 / 86 with a pulse of 66       Per Dr. Amon increase   amlodipine  to 10 mg and come back in one month for nurse visit  in one month for BP check.  She was scheduled for 02/11/24.

## 2024-02-10 NOTE — Progress Notes (Unsigned)
 Patient comes in today for blood pressure check.  Last readings at nurse visit on 01/19/2024:  150/89, 150/86  Lauren Hodge is taking Amlodipine  10 mg for blood pressure control (increased from 5 mg at last visit on 01/19/2024). Lauren Hodge denies any missed doses, side effects, headaches, chest pain, palpitations, dizziness, or shortness of breath.   Lauren Hodge has/hasn't*** been checking blood pressure readings at home.  Lauren Hodge first blood pressure reading today is: ***. After sitting, Lauren Hodge second reading is: ***.   I spoke with Aloysius Mech, MD who advised ***.

## 2024-02-11 ENCOUNTER — Other Ambulatory Visit (HOSPITAL_BASED_OUTPATIENT_CLINIC_OR_DEPARTMENT_OTHER): Payer: Self-pay

## 2024-02-11 ENCOUNTER — Ambulatory Visit (INDEPENDENT_AMBULATORY_CARE_PROVIDER_SITE_OTHER): Payer: Medicare (Managed Care) | Admitting: *Deleted

## 2024-02-11 VITALS — BP 136/73 | HR 69

## 2024-02-11 DIAGNOSIS — I10 Essential (primary) hypertension: Secondary | ICD-10-CM | POA: Diagnosis not present

## 2024-02-11 MED ORDER — METOPROLOL SUCCINATE ER 50 MG PO TB24
50.0000 mg | ORAL_TABLET | Freq: Every day | ORAL | 0 refills | Status: DC
  Filled 2024-02-11: qty 90, 90d supply, fill #0

## 2024-02-11 MED ORDER — AMLODIPINE BESYLATE 5 MG PO TABS
5.0000 mg | ORAL_TABLET | Freq: Every day | ORAL | 0 refills | Status: DC
  Filled 2024-02-11 – 2024-03-08 (×3): qty 90, 90d supply, fill #0

## 2024-02-11 NOTE — Progress Notes (Addendum)
 BP yesterday at home was in the 130s. Today 136/73 which is better than before.  Currently taking losartan  100 mg daily. Metoprolol  XL 25 mg 1 tablet daily  amlodipine  was increased to 10 mg daily  BP has improved with higher dose of amlodipine  but she has developed edema Plan:  Go back on amlodipine  5 mg qd Increase metoprolol  XL to 50 mg 1 tablet daily send a prescription Monitor BPs at home Nurse visit for BP check in 6 weeks

## 2024-03-01 ENCOUNTER — Ambulatory Visit: Payer: Medicare (Managed Care) | Attending: General Surgery | Admitting: Physical Therapy

## 2024-03-01 ENCOUNTER — Other Ambulatory Visit (HOSPITAL_BASED_OUTPATIENT_CLINIC_OR_DEPARTMENT_OTHER): Payer: Self-pay

## 2024-03-01 DIAGNOSIS — C50111 Malignant neoplasm of central portion of right female breast: Secondary | ICD-10-CM | POA: Insufficient documentation

## 2024-03-01 DIAGNOSIS — Z170 Estrogen receptor positive status [ER+]: Secondary | ICD-10-CM | POA: Insufficient documentation

## 2024-03-01 NOTE — Therapy (Signed)
 OUTPATIENT PHYSICAL THERAPY SOZO SCREENING NOTE   Patient Name: Lauren Hodge MRN: [id number] DOB:[birth year], 68 y.o., female Today's Date: 03/01/2024  PCP: Amon Aloysius BRAVO, MD REFERRING PROVIDER: Aron Shoulders, MD   PT End of Session - 03/01/24 1444     Visit Number 13   unchanged due to screen only   PT Start Time 1440    PT Stop Time 1444    PT Time Calculation (min) 4 min    Activity Tolerance Patient tolerated treatment well    Behavior During Therapy WFL for tasks assessed/performed          Past Medical History:  Diagnosis Date   Blood transfusion without reported diagnosis    Breast mass    Cancer (HCC)    Carpal tunnel syndrome    Family history of prostate cancer    Fracture of carpal bone 2018   bilateral   GERD (gastroesophageal reflux disease)    Hemorrhoids    Hiatal hernia 02/2019   seen on EGD    History of left breast biopsy    Benign   HTN (hypertension)    Hyperlipidemia    Kidney stones    Osteoporosis    Osteoporosis    Vitamin D  deficiency    Past Surgical History:  Procedure Laterality Date   BIOPSY THYROID      BREAST BIOPSY Left 2010   x2, 2010-2012 (no cancer)   BREAST BIOPSY Right 01/10/2023   US  RT BREAST BX W LOC DEV 1ST LESION IMG BX SPEC US  GUIDE 01/10/2023 [facility]   BREAST BIOPSY Right 01/10/2023   MM RT BREAST BX W LOC DEV 1ST LESION IMAGE BX SPEC STEREO GUIDE 01/10/2023 [facility]   BREAST BIOPSY Right 01/22/2023   MM RT BREAST BX W LOC DEV 1ST LESION IMAGE BX SPEC STEREO GUIDE 01/22/2023 [facility]   BREAST BIOPSY Right 01/22/2023   MM RT BREAST BX W LOC DEV EA AD LESION IMG BX SPEC STEREO GUIDE 01/22/2023 [facility]   BREAST BIOPSY Left 01/22/2023   MM LT BREAST BX W LOC DEV 1ST LESION IMAGE BX SPEC STEREO GUIDE 01/22/2023 [facility]   BREAST EXCISIONAL BIOPSY Left    CHOLECYSTECTOMY  2015   HEMORRHOIDECTOMY WITH HEMORRHOID BANDING  04/22/2017   SIMPLE MASTECTOMY WITH AXILLARY  SENTINEL NODE BIOPSY Bilateral 03/05/2023   Procedure: BILATERAL MASTECTOMIES WITH BILATERAL MAGTRACE INJECTIONS;  Surgeon: Aron Shoulders, MD;  Location: [facility];  Service: General;  Laterality: Bilateral;  PEC BLOCK   Patient Active Problem List   Diagnosis Date Noted   Osteoporosis 12/03/2023   Bilateral breast cancer (HCC) 03/05/2023   Family history of prostate cancer    Genetic testing 02/07/2023   Ductal carcinoma in situ (DCIS) of right breast 01/21/2023   Malignant neoplasm of central portion of female breast (HCC) 01/14/2023   Trigger finger, right middle finger 09/11/2021   Annual physical exam 10/31/2020   PCP NOTES >>>>>>>>>>>>>>>>>> 08/26/2020   GERD (gastroesophageal reflux disease) 07/26/2020   Hyperlipidemia 10/06/2019   Vitamin D  deficiency 06/18/2018   Hypertensive disorder 05/18/2018    REFERRING DIAG: right breast cancer at risk for lymphedema  THERAPY DIAG:  Malignant neoplasm of central portion of right breast in female, estrogen receptor positive (HCC)  PERTINENT HISTORY: 03/05/23: bilateral mastectomies and R SLNB,on L breast cancer: DCIS, grade 2, greatest dimension: 2.5 mm, ER: 95%, positive, PR: 60%. R breast cancer: DCIS grade 2, greatest dimension 7 mm, ER: 100%, positive, PR: 85%, positive, SLNB  0/4 on R, high blood pressure   PRECAUTIONS: right UE Lymphedema risk, None  SUBJECTIVE: Pt returns for her 3 month L-screen.   PAIN:  Are you having pain? No   SOZO SCREENING: Patient was assessed today using the SOZO machine to determine the lymphedema index score. This was compared to her baseline score. It was determined that she is within the recommended range when compared to her baseline and no further action is needed at this time. She will continue SOZO screenings. These are done every 3 months for 2 years post operatively followed by every 6 months for 2 years, and then annually.   L-DEX FLOWSHEETS - 03/01/24 1400       L-DEX  LYMPHEDEMA SCREENING   Measurement Type Unilateral    L-DEX MEASUREMENT EXTREMITY Upper Extremity    POSITION  Standing    DOMINANT SIDE Right    At Risk Side Right    BASELINE SCORE (UNILATERAL) 2.7    L-DEX SCORE (UNILATERAL) 0.8    VALUE CHANGE (UNILAT) -1.9    Comment wt [address] [place], PT 03/01/2024, 2:45 PM

## 2024-03-05 ENCOUNTER — Telehealth: Payer: Self-pay | Admitting: Hematology and Oncology

## 2024-03-05 NOTE — Telephone Encounter (Signed)
 A printed reminder has been mailed to patient regarding rescheduled appointment in December 2025.

## 2024-03-08 ENCOUNTER — Other Ambulatory Visit (HOSPITAL_BASED_OUTPATIENT_CLINIC_OR_DEPARTMENT_OTHER): Payer: Self-pay

## 2024-03-24 ENCOUNTER — Ambulatory Visit (INDEPENDENT_AMBULATORY_CARE_PROVIDER_SITE_OTHER): Payer: Medicare (Managed Care) | Admitting: Family Medicine

## 2024-03-24 VITALS — BP 132/82 | HR 58

## 2024-03-24 DIAGNOSIS — I10 Essential (primary) hypertension: Secondary | ICD-10-CM

## 2024-03-24 NOTE — Progress Notes (Signed)
 Patient is here for Blood pressure check per Aloysius Mech, MD  Patient currently takes:  Losartan  100mg  once, daily Metoprolol  XL 50mg  once, daily Amlodipine  5mg  daily   Pt reports compliance with medication. Patient states that medication has been working and feels well.  BP today @ = 142/78 HR =58 bpm PO2 = 99%  BP today @ = 132/82 HR = 58 bpm PO2 = 99%  Patient advised per Dr. Frann, MD  Dr. Frann suggested changing Amlodipine  to 10 mg but the patient declined because it tends to swell her legs. If needed, she said that she will follow up with Dr. Mech at her next appointment.  Patient next appointment is 04/13/2024, Dr. Aloysius Mech, MD

## 2024-03-29 ENCOUNTER — Ambulatory Visit: Payer: Medicare HMO | Admitting: Hematology and Oncology

## 2024-04-08 ENCOUNTER — Inpatient Hospital Stay: Payer: Medicare (Managed Care) | Attending: Hematology and Oncology | Admitting: Hematology and Oncology

## 2024-04-08 VITALS — BP 153/65 | HR 69 | Temp 98.0°F | Resp 17 | Wt 170.1 lb

## 2024-04-08 DIAGNOSIS — M792 Neuralgia and neuritis, unspecified: Secondary | ICD-10-CM | POA: Insufficient documentation

## 2024-04-08 DIAGNOSIS — M19049 Primary osteoarthritis, unspecified hand: Secondary | ICD-10-CM | POA: Insufficient documentation

## 2024-04-08 DIAGNOSIS — M19039 Primary osteoarthritis, unspecified wrist: Secondary | ICD-10-CM | POA: Insufficient documentation

## 2024-04-08 DIAGNOSIS — D0511 Intraductal carcinoma in situ of right breast: Secondary | ICD-10-CM

## 2024-04-08 DIAGNOSIS — Z9013 Acquired absence of bilateral breasts and nipples: Secondary | ICD-10-CM | POA: Diagnosis not present

## 2024-04-08 DIAGNOSIS — Z86000 Personal history of in-situ neoplasm of breast: Secondary | ICD-10-CM | POA: Insufficient documentation

## 2024-04-08 DIAGNOSIS — Z87891 Personal history of nicotine dependence: Secondary | ICD-10-CM | POA: Insufficient documentation

## 2024-04-08 DIAGNOSIS — I10 Essential (primary) hypertension: Secondary | ICD-10-CM | POA: Insufficient documentation

## 2024-04-08 NOTE — Progress Notes (Signed)
 [facility] CONSULT NOTE  Patient Care Team: Amon Aloysius BRAVO, MD as PCP - General (Internal Medicine) Teressa Toribio SQUIBB, MD (Inactive) as Attending Physician (Gastroenterology) Loretha Ash, MD as Consulting Physician (Hematology and Oncology) Tyree Nanetta SAILOR, RN as Oncology Nurse Navigator Aron Shoulders, MD as Consulting Physician (General Surgery) Izell Domino, MD as Attending Physician (Radiation Oncology)  CHIEF COMPLAINTS/PURPOSE OF CONSULTATION:  Newly diagnosed breast cancer  HISTORY OF PRESENTING ILLNESS:  Lauren Hodge 67 y.o. female is here because of history of DCIS. I reviewed her records extensively and collaborated the history with the patient.  SUMMARY OF ONCOLOGIC HISTORY: Oncology History  Ductal carcinoma in situ (DCIS) of right breast  12/31/2022 Mammogram   Patient had diagnostic mammogram for unilateral spontaneous bloody nipple discharge and this showed 3.7 cm linearly oriented group of round and punctate calcs in the retroareolar right breast there is also a 5 x 5 x 7 mm solid and cystic mass with associated calcs right breast 3:00 retroareolar location   01/10/2023 Pathology Results   Right breast needle core biopsy at 3:00 retroareolar showed DCIS intermediate to high-grade, right breast needle core biopsy retroareolar coil clip showed DCIS intermediate to high-grade, prognostic showed ER 100% positive strong staining, PR 85% positive strong staining   01/21/2023 Initial Diagnosis   Ductal carcinoma in situ (DCIS) of right breast   01/22/2023 Mammogram   Mammogram of the left breast showed 3.3 cm group of indeterminate calcs in the upper outer anterior left breast   01/22/2023 Pathology Results   Right breast needle core biopsy upper inner clip showed benign breast tissue with fibrocystic change and focal columnar cell change involving mild adenosis and associated calcs.  Right breast upper outer Venus clip showed benign breast tissue with  extensive sclerosing adenosis and prominent fibrocystic change with associated calcs and a focal incidental radial scar.  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  The report date is February 17, 2023.  The CancerNext-Expanded gene panel offered by [facility] and includes sequencing and rearrangement analysis for the following 77 genes: AIP, ALK, APC*, ATM*, AXIN2, BAP1, BARD1, BMPR1A, BRCA1*, BRCA2*, BRIP1*, CDC73, CDH1*, CDK4, CDKN1B, CDKN2A, CHEK2*, CTNNA1, DICER1, FH, FLCN, KIF1B, LZTR1, MAX, MEN1, MET, MLH1*, MSH2*, MSH3, MSH6*, MUTYH*, NF1*, NF2, NTHL1, PALB2*, PHOX2B, PMS2*, POT1, PRKAR1A, PTCH1, PTEN*, RAD51C*, RAD51D*, RB1, RET, SDHA, SDHAF2, SDHB, SDHC, SDHD, SMAD4, SMARCA4, SMARCB1, SMARCE1, STK11, SUFU, TMEM127, TP53*, TSC1, TSC2, and VHL (sequencing and deletion/duplication); EGFR, EGLN1, HOXB13, KIT, MITF, PDGFRA, POLD1, and POLE (sequencing only); EPCAM and GREM1 (deletion/duplication only). DNA and RNA analyses performed for * genes.     Discussed the use of AI scribe software for clinical note transcription with the patient, who gave verbal consent to proceed.  History of Present Illness      MEDICAL HISTORY:  Past Medical History:  Diagnosis Date   Blood transfusion without reported diagnosis    Breast mass    Cancer [facility])    Carpal tunnel syndrome    Family history of prostate cancer    Fracture of carpal bone 2018   bilateral   GERD (gastroesophageal reflux disease)    Hemorrhoids    Hiatal hernia 02/2019   seen on EGD  History of left breast biopsy    Benign   HTN (hypertension)    Hyperlipidemia    Kidney stones    Osteoporosis    Osteoporosis     Vitamin D  deficiency     SURGICAL HISTORY: Past Surgical History:  Procedure Laterality Date   BIOPSY THYROID      BREAST BIOPSY Left 2010   x2, 2010-2012 (no cancer)   BREAST BIOPSY Right 01/10/2023   US  RT BREAST BX W LOC DEV 1ST LESION IMG BX SPEC US  GUIDE 01/10/2023 [facility]   BREAST BIOPSY Right 01/10/2023   MM RT BREAST BX W LOC DEV 1ST LESION IMAGE BX SPEC STEREO GUIDE 01/10/2023 [facility]   BREAST BIOPSY Right 01/22/2023   MM RT BREAST BX W LOC DEV 1ST LESION IMAGE BX SPEC STEREO GUIDE 01/22/2023 [facility]   BREAST BIOPSY Right 01/22/2023   MM RT BREAST BX W LOC DEV EA AD LESION IMG BX SPEC STEREO GUIDE 01/22/2023 [facility]   BREAST BIOPSY Left 01/22/2023   MM LT BREAST BX W LOC DEV 1ST LESION IMAGE BX SPEC STEREO GUIDE 01/22/2023 [facility]   BREAST EXCISIONAL BIOPSY Left    CHOLECYSTECTOMY  2015   HEMORRHOIDECTOMY WITH HEMORRHOID BANDING  04/22/2017   SIMPLE MASTECTOMY WITH AXILLARY SENTINEL NODE BIOPSY Bilateral 03/05/2023   Procedure: BILATERAL MASTECTOMIES WITH BILATERAL MAGTRACE INJECTIONS;  Surgeon: Aron Shoulders, MD;  Location: [facility];  Service: General;  Laterality: Bilateral;  PEC BLOCK    SOCIAL HISTORY: Social History   Socioeconomic History   Marital status: Married    Spouse name: Not on file   Number of children: 2   Years of education: Not on file   Highest education level: Not on file  Occupational History   Occupation: retired   Tobacco Use   Smoking status: Former    Current packs/day: 0.00    Types: Cigarettes    Quit date: 08/25/2018    Years since quitting: 5.6   Smokeless tobacco: Never   Tobacco comments:    used to smoke rarely   Vaping Use   Vaping status: Never Used  Substance and Sexual Activity   Alcohol use: Not Currently    Comment: occasional   Drug use: Never   Sexual activity: Not Currently    Birth control/protection: Post-menopausal  Other Topics Concern   Not on  file  Social History Narrative   From [place] from [place] 2019   Household: pt and husband; daughter    Social Drivers of Health   Tobacco Use: Medium Risk (03/24/2024)   Patient History    Smoking Tobacco Use: Former    Smokeless Tobacco Use: Never    Passive Exposure: Not on Actuary Strain: Not on file  Food Insecurity: Not on file  Transportation Needs: No Transportation Needs (01/20/2023)   PRAPARE - Administrator, Civil Service (Medical): No    Lack of Transportation (Non-Medical): No  Physical Activity: Not on file  Stress: Not on file  Social Connections: Unknown (09/04/2021)   Received [facility]   Social Network    Social Network: Not on file  Intimate Partner Violence: Not At Risk (01/20/2023)   Humiliation, Afraid, Rape, and Kick questionnaire    Fear of Current or Ex-Partner: No    Emotionally Abused: No    Physically Abused: No    Sexually Abused: No  Depression (PHQ2-9): Low Risk (12/02/2023)   Depression (PHQ2-9)    PHQ-2  Score: 0  Alcohol Screen: Not on file  Housing: Unknown (12/12/2023)   Received from [facility]   Epic    Unable to Pay for Housing in the Last Year: Not on file    Number of Times Moved in the Last Year: Not on file    At any time in the past 12 months, were you homeless or living in a shelter (including now)?: No  Utilities: Not At Risk (01/20/2023)   [employer] Utilities    Threatened with loss of utilities: No  Health Literacy: Adequate Health Literacy (01/20/2023)   B1300 Health Literacy    Frequency of need for help with medical instructions: Never    FAMILY HISTORY: Family History  Problem Relation Age of Onset   Hypertension Mother    Prostate cancer Father    Tongue cancer Maternal Aunt    Cervical cancer Maternal Grandmother    Liver disease Neg Hx    Colon cancer Neg Hx    Esophageal cancer Neg Hx    Colon polyps Neg Hx     ALLERGIES:  has no known  allergies.  MEDICATIONS:  Current Outpatient Medications  Medication Sig Dispense Refill   amLODipine  (NORVASC ) 5 MG tablet Take 1 tablet (5 mg total) by mouth daily. 90 tablet 0   gabapentin  (NEURONTIN ) 100 MG capsule Take 1 capsule (100 mg total) by mouth 2 (two) times daily as needed for burning pain. 30 capsule 3   losartan  (COZAAR ) 100 MG tablet Take 1 tablet (100 mg total) by mouth daily. 90 tablet 1   metoprolol  succinate (TOPROL -XL) 50 MG 24 hr tablet Take 1 tablet (50 mg total) by mouth daily. Take with or immediately following a meal. 90 tablet 0   Multiple Vitamins-Minerals (CENTRUM ADULTS PO) Take by mouth.     pantoprazole  (PROTONIX ) 20 MG tablet Take 1 tablet (20 mg total) by mouth daily before breakfast. 90 tablet 3   VITAMIN D , CHOLECALCIFEROL , PO Take by mouth.     No current facility-administered medications for this visit.    REVIEW OF SYSTEMS:   Constitutional: Denies fevers, chills or abnormal night sweats Eyes: Denies blurriness of vision, double vision or watery eyes Ears, nose, mouth, throat, and face: Denies mucositis or sore throat Respiratory: Denies cough, dyspnea or wheezes Cardiovascular: Denies palpitation, chest discomfort or lower extremity swelling Gastrointestinal:  Denies nausea, heartburn or change in bowel habits Skin: Denies abnormal skin rashes Lymphatics: Denies new lymphadenopathy or easy bruising Neurological:Denies numbness, tingling or new weaknesses Behavioral/Psych: Mood is stable, no new changes  Breast: Denies any palpable lumps or discharge All other systems were reviewed with the patient and are negative.  PHYSICAL EXAMINATION: ECOG PERFORMANCE STATUS: 0 - Asymptomatic  Vitals:   04/08/24 0858  BP: (!) 153/65  Pulse: 69  Resp: 17  Temp: 98 F (36.7 C)  SpO2: 100%    Filed Weights   04/08/24 0858  Weight: 170 lb 1.6 oz (77.2 kg)     GENERAL:alert, no distress and comfortable Bilateral mastectomy, drains in  place.  LABORATORY DATA:  I have reviewed the data as listed Lab Results  Component Value Date   WBC 5.8 07/29/2023   HGB 12.6 07/29/2023   HCT 36.6 07/29/2023   MCV 88.0 07/29/2023   PLT 242.0 07/29/2023   Lab Results  Component Value Date   NA 141 12/17/2023   K 4.2 12/17/2023   CL 104 12/17/2023   CO2 27 12/17/2023    RADIOGRAPHIC STUDIES: I have  personally reviewed the radiological reports and agreed with the findings in the report.  ASSESSMENT AND PLAN:  No problem-specific Assessment & Plan notes found for this encounter.     All questions were answered. The patient knows to call the clinic with any problems, questions or concerns.    Amber Stalls, MD 04/08/2024

## 2024-04-08 NOTE — Progress Notes (Signed)
  Cancer Center CONSULT NOTE  Patient Care Team: Amon Aloysius BRAVO, MD as PCP - General (Internal Medicine) Teressa Toribio SQUIBB, MD (Inactive) as Attending Physician (Gastroenterology) Loretha Ash, MD as Consulting Physician (Hematology and Oncology) Tyree Nanetta SAILOR, RN as Oncology Nurse Navigator Aron Shoulders, MD as Consulting Physician (General Surgery) Izell Domino, MD as Attending Physician (Radiation Oncology)  CHIEF COMPLAINTS/PURPOSE OF CONSULTATION:  Newly diagnosed breast cancer  HISTORY OF PRESENTING ILLNESS:  Lauren Hodge 67 y.o. female is here because of recent diagnosis of left breast DCIS s/p bilateral mastectomy  I reviewed her records extensively and collaborated the history with the patient.  SUMMARY OF ONCOLOGIC HISTORY: Oncology History  Ductal carcinoma in situ (DCIS) of right breast  12/31/2022 Mammogram   Patient had diagnostic mammogram for unilateral spontaneous bloody nipple discharge and this showed 3.7 cm linearly oriented group of round and punctate calcs in the retroareolar right breast there is also a 5 x 5 x 7 mm solid and cystic mass with associated calcs right breast 3:00 retroareolar location   01/10/2023 Pathology Results   Right breast needle core biopsy at 3:00 retroareolar showed DCIS intermediate to high-grade, right breast needle core biopsy retroareolar coil clip showed DCIS intermediate to high-grade, prognostic showed ER 100% positive strong staining, PR 85% positive strong staining   01/21/2023 Initial Diagnosis   Ductal carcinoma in situ (DCIS) of right breast   01/22/2023 Mammogram   Mammogram of the left breast showed 3.3 cm group of indeterminate calcs in the upper outer anterior left breast   01/22/2023 Pathology Results   Right breast needle core biopsy upper inner clip showed benign breast tissue with fibrocystic change and focal columnar cell change involving mild adenosis and associated calcs.  Right breast upper outer  Venus clip showed benign breast tissue with extensive sclerosing adenosis and prominent fibrocystic change with associated calcs and a focal incidental radial scar.  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  The report date is February 17, 2023.  The CancerNext-Expanded gene panel offered by [facility] and includes sequencing and rearrangement analysis for the following 77 genes: AIP, ALK, APC*, ATM*, AXIN2, BAP1, BARD1, BMPR1A, BRCA1*, BRCA2*, BRIP1*, CDC73, CDH1*, CDK4, CDKN1B, CDKN2A, CHEK2*, CTNNA1, DICER1, FH, FLCN, KIF1B, LZTR1, MAX, MEN1, MET, MLH1*, MSH2*, MSH3, MSH6*, MUTYH*, NF1*, NF2, NTHL1, PALB2*, PHOX2B, PMS2*, POT1, PRKAR1A, PTCH1, PTEN*, RAD51C*, RAD51D*, RB1, RET, SDHA, SDHAF2, SDHB, SDHC, SDHD, SMAD4, SMARCA4, SMARCB1, SMARCE1, STK11, SUFU, TMEM127, TP53*, TSC1, TSC2, and VHL (sequencing and deletion/duplication); EGFR, EGLN1, HOXB13, KIT, MITF, PDGFRA, POLD1, and POLE (sequencing only); EPCAM and GREM1 (deletion/duplication only). DNA and RNA analyses performed for * genes.     Discussed the use of AI scribe software for clinical note transcription with the patient, who gave verbal consent to proceed.  History of Present Illness    History of Present Illness Lauren Hodge is a 68 year old female with bilateral ductal carcinoma in situ, status post bilateral mastectomy, presenting for routine oncology follow-up.  She underwent bilateral mastectomy for DCIS and has not had breast reconstruction. Her last oncology follow-up was in December 2024.  She experiences intermittent, shooting, electrical pain in the chest, which occurs occasionally and resolves spontaneously.  There is no pain  or discomfort at the surgical scar sites. She denies chest pain, shortness of breath, or other cardiopulmonary symptoms. Respirations are normal.  She reports bilateral hand pain, which she associates with activities such as cleaning. There are no changes in her ability to perform activities of daily living.  Her medication regimen is unchanged since her last visit, including losartan  100 mg, metoprolol , and a multivitamin with vitamin D3.  She denies gastrointestinal or genitourinary symptoms. Bowel movements and urination are normal. She denies falls and overall feels well.  Rest of the pertinent 10 point ROS reviewed and neg.  MEDICAL HISTORY:  Past Medical History:  Diagnosis Date   Blood transfusion without reported diagnosis    Breast mass    Cancer [facility])    Carpal tunnel syndrome    Family history of prostate cancer    Fracture of carpal bone 2018   bilateral   GERD (gastroesophageal reflux disease)    Hemorrhoids    Hiatal hernia 02/2019   seen on EGD    History of left breast biopsy    Benign   HTN (hypertension)    Hyperlipidemia    Kidney stones    Osteoporosis    Osteoporosis    Vitamin D  deficiency     SURGICAL HISTORY: Past Surgical History:  Procedure Laterality Date   BIOPSY THYROID      BREAST BIOPSY Left 2010   x2, 2010-2012 (no cancer)   BREAST BIOPSY Right 01/10/2023   US  RT BREAST BX W LOC DEV 1ST LESION IMG BX SPEC US  GUIDE 01/10/2023 [facility]   BREAST BIOPSY Right 01/10/2023   MM RT BREAST BX W LOC DEV 1ST LESION IMAGE BX SPEC STEREO GUIDE 01/10/2023 [facility]   BREAST BIOPSY Right 01/22/2023   MM RT BREAST BX W LOC DEV 1ST LESION IMAGE BX SPEC STEREO GUIDE 01/22/2023 [facility]   BREAST BIOPSY Right 01/22/2023   MM RT BREAST BX W LOC DEV EA AD LESION IMG BX SPEC STEREO GUIDE 01/22/2023 [facility]   BREAST BIOPSY Left 01/22/2023   MM LT BREAST BX W LOC DEV 1ST LESION IMAGE BX SPEC STEREO GUIDE  01/22/2023 [facility]   BREAST EXCISIONAL BIOPSY Left    CHOLECYSTECTOMY  2015   HEMORRHOIDECTOMY WITH HEMORRHOID BANDING  04/22/2017   SIMPLE MASTECTOMY WITH AXILLARY SENTINEL NODE BIOPSY Bilateral 03/05/2023   Procedure: BILATERAL MASTECTOMIES WITH BILATERAL MAGTRACE INJECTIONS;  Surgeon: Aron Shoulders, MD;  Location: [facility];  Service: General;  Laterality: Bilateral;  PEC BLOCK    SOCIAL HISTORY: Social History   Socioeconomic History   Marital status: Married    Spouse name: Not on file   Number of children: 2   Years of education: Not on file   Highest education level: Not on file  Occupational History   Occupation: retired   Tobacco Use   Smoking status: Former    Current packs/day: 0.00    Types: Cigarettes    Quit date: 08/25/2018    Years since quitting: 5.6   Smokeless tobacco: Never   Tobacco comments:    used to smoke rarely   Vaping Use   Vaping status: Never Used  Substance and Sexual Activity   Alcohol use: Not Currently    Comment: occasional   Drug use: Never   Sexual activity: Not Currently    Birth control/protection: Post-menopausal  Other Topics Concern   Not on file  Social History Narrative   From [place] from [place] 2019   Household: pt  and husband; daughter    Social Drivers of Health   Tobacco Use: Medium Risk (03/24/2024)   Patient History    Smoking Tobacco Use: Former    Smokeless Tobacco Use: Never    Passive Exposure: Not on Actuary Strain: Not on file  Food Insecurity: Not on file  Transportation Needs: No Transportation Needs (01/20/2023)   PRAPARE - Administrator, Civil Service (Medical): No    Lack of Transportation (Non-Medical): No  Physical Activity: Not on file  Stress: Not on file  Social Connections: Unknown (09/04/2021)   Received from [facility]   Social Network    Social Network: Not on file  Intimate Partner Violence: Not At Risk (01/20/2023)   Humiliation,  Afraid, Rape, and Kick questionnaire    Fear of Current or Ex-Partner: No    Emotionally Abused: No    Physically Abused: No    Sexually Abused: No  Depression (PHQ2-9): Low Risk (12/02/2023)   Depression (PHQ2-9)    PHQ-2 Score: 0  Alcohol Screen: Not on file  Housing: Unknown (12/12/2023)   Received from [facility]   Epic    Unable to Pay for Housing in the Last Year: Not on file    Number of Times Moved in the Last Year: Not on file    At any time in the past 12 months, were you homeless or living in a shelter (including now)?: No  Utilities: Not At Risk (01/20/2023)   [employer] Utilities    Threatened with loss of utilities: No  Health Literacy: Adequate Health Literacy (01/20/2023)   B1300 Health Literacy    Frequency of need for help with medical instructions: Never    FAMILY HISTORY: Family History  Problem Relation Age of Onset   Hypertension Mother    Prostate cancer Father    Tongue cancer Maternal Aunt    Cervical cancer Maternal Grandmother    Liver disease Neg Hx    Colon cancer Neg Hx    Esophageal cancer Neg Hx    Colon polyps Neg Hx     ALLERGIES:  has no known allergies.  MEDICATIONS:  Current Outpatient Medications  Medication Sig Dispense Refill   amLODipine  (NORVASC ) 5 MG tablet Take 1 tablet (5 mg total) by mouth daily. 90 tablet 0   gabapentin  (NEURONTIN ) 100 MG capsule Take 1 capsule (100 mg total) by mouth 2 (two) times daily as needed for burning pain. 30 capsule 3   losartan  (COZAAR ) 100 MG tablet Take 1 tablet (100 mg total) by mouth daily. 90 tablet 1   metoprolol  succinate (TOPROL -XL) 50 MG 24 hr tablet Take 1 tablet (50 mg total) by mouth daily. Take with or immediately following a meal. 90 tablet 0   Multiple Vitamins-Minerals (CENTRUM ADULTS PO) Take by mouth.     pantoprazole  (PROTONIX ) 20 MG tablet Take 1 tablet (20 mg total) by mouth daily before breakfast. 90 tablet 3   VITAMIN D , CHOLECALCIFEROL , PO Take by mouth.     No  current facility-administered medications for this visit.    REVIEW OF SYSTEMS:   Constitutional: Denies fevers, chills or abnormal night sweats Eyes: Denies blurriness of vision, double vision or watery eyes Ears, nose, mouth, throat, and face: Denies mucositis or sore throat Respiratory: Denies cough, dyspnea or wheezes Cardiovascular: Denies palpitation, chest discomfort or lower extremity swelling Gastrointestinal:  Denies nausea, heartburn or change in bowel habits Skin: Denies abnormal skin rashes Lymphatics: Denies new lymphadenopathy or easy  bruising Neurological:Denies numbness, tingling or new weaknesses Behavioral/Psych: Mood is stable, no new changes  Breast: Denies any palpable lumps or discharge All other systems were reviewed with the patient and are negative.  PHYSICAL EXAMINATION: ECOG PERFORMANCE STATUS: 0 - Asymptomatic  Vitals:   04/08/24 0858  BP: (!) 153/65  Pulse: 69  Resp: 17  Temp: 98 F (36.7 C)  SpO2: 100%     Filed Weights   04/08/24 0858  Weight: 170 lb 1.6 oz (77.2 kg)    GENERAL:alert, no distress and comfortable No concern for local recurrence No regional adenopathy  LABORATORY DATA:  I have reviewed the data as listed Lab Results  Component Value Date   WBC 5.8 07/29/2023   HGB 12.6 07/29/2023   HCT 36.6 07/29/2023   MCV 88.0 07/29/2023   PLT 242.0 07/29/2023   Lab Results  Component Value Date   NA 141 12/17/2023   K 4.2 12/17/2023   CL 104 12/17/2023   CO2 27 12/17/2023    RADIOGRAPHIC STUDIES: I have personally reviewed the radiological reports and agreed with the findings in the report.  ASSESSMENT AND PLAN:   Assessment & Plan History of bilateral ductal carcinoma in situ, status post bilateral mastectomy Bilateral DCIS treated with mastectomy. No concern for recurrence. Scheduled follow-up in one year.  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 No  concerning features. - Provided reassurance regarding typical post-mastectomy neuropathic pain course.  Arthritis of the wrist/hand Bilateral hand and wrist pain likely due to arthritis, affecting daily function. - Recommended wrist support for symptomatic relief.  Hypertension Hypertension well-managed on current regimen. - Reviewed current antihypertensive regimen.   All questions were answered. The patient knows to call the clinic with any problems, questions or concerns.    Amber Stalls, MD 04/08/2024

## 2024-04-13 ENCOUNTER — Ambulatory Visit (INDEPENDENT_AMBULATORY_CARE_PROVIDER_SITE_OTHER): Payer: Medicare (Managed Care) | Admitting: Internal Medicine

## 2024-04-13 ENCOUNTER — Encounter: Payer: Self-pay | Admitting: Internal Medicine

## 2024-04-13 ENCOUNTER — Other Ambulatory Visit (HOSPITAL_BASED_OUTPATIENT_CLINIC_OR_DEPARTMENT_OTHER): Payer: Self-pay

## 2024-04-13 ENCOUNTER — Other Ambulatory Visit: Payer: Self-pay

## 2024-04-13 ENCOUNTER — Other Ambulatory Visit (HOSPITAL_COMMUNITY): Payer: Self-pay

## 2024-04-13 VITALS — BP 126/82 | HR 63 | Temp 97.6°F | Resp 16 | Ht 59.0 in | Wt 170.2 lb

## 2024-04-13 DIAGNOSIS — E785 Hyperlipidemia, unspecified: Secondary | ICD-10-CM | POA: Diagnosis not present

## 2024-04-13 DIAGNOSIS — Z23 Encounter for immunization: Secondary | ICD-10-CM | POA: Diagnosis not present

## 2024-04-13 DIAGNOSIS — Z0001 Encounter for general adult medical examination with abnormal findings: Secondary | ICD-10-CM | POA: Diagnosis not present

## 2024-04-13 DIAGNOSIS — Z0000 Encounter for general adult medical examination without abnormal findings: Secondary | ICD-10-CM | POA: Diagnosis not present

## 2024-04-13 DIAGNOSIS — R1310 Dysphagia, unspecified: Secondary | ICD-10-CM

## 2024-04-13 DIAGNOSIS — M810 Age-related osteoporosis without current pathological fracture: Secondary | ICD-10-CM

## 2024-04-13 DIAGNOSIS — I10 Essential (primary) hypertension: Secondary | ICD-10-CM

## 2024-04-13 DIAGNOSIS — Z01419 Encounter for gynecological examination (general) (routine) without abnormal findings: Secondary | ICD-10-CM

## 2024-04-13 MED ORDER — HYDROCHLOROTHIAZIDE 25 MG PO TABS
25.0000 mg | ORAL_TABLET | Freq: Every day | ORAL | 3 refills | Status: AC
  Filled 2024-04-13: qty 30, 30d supply, fill #0
  Filled 2024-05-12: qty 30, 30d supply, fill #1

## 2024-04-13 MED ORDER — PANTOPRAZOLE SODIUM 40 MG PO TBEC
40.0000 mg | DELAYED_RELEASE_TABLET | Freq: Every day | ORAL | 6 refills | Status: AC
  Filled 2024-04-13 (×2): qty 30, 30d supply, fill #0

## 2024-04-13 NOTE — Assessment & Plan Note (Signed)
 Here for CPX Tdap: 2022 PNM 20 today  REC: flu-covid- shingrix >>> declines for now CCS: Had a colonoscopy 02-2019, cscope 06/2023- next per GI  H/o breast ca Last PAP 11/06/2020, refer to gyn Labs:  CMP FLP entheses in 2 weeks)

## 2024-04-13 NOTE — Progress Notes (Signed)
 "  Subjective:    Patient ID: Lauren Hodge, female    DOB: April 26, 1955, 68 y.o.   MRN: [id number]  DOS:  04/13/2024 CPX  Discussed the use of AI scribe software for clinical note transcription with the patient, who gave verbal consent to proceed.  History of Present Illness Lauren Hodge is a 68 year old female who presents for an annual physical exam.  Pruritic dermatitis and edema - Has developed lower extremity edema, skin feels dry and itchy.    - Requests topical treatment for symptom relief  Hypertension and medication side effects - Home blood pressure readings average 130/80 mmHg - Amlodipine  dose decreased from 10 mg to 5 mg, resulting in only mild edema improvement  Gastroesophageal reflux disease and dysphagia - GERD with dysphagia, describes as food getting stuck on her chest with occasional regurgitation. - History of hiatal hernia - Pantoprazole  previously prescribed but not taken regularly; relies more on dietary modifications  Osteoporosis and musculoskeletal symptoms - Osteoporosis with intermittent leg pain - Takes vitamin D  2000 units daily and a multivitamin  Preventive care and screening - Has not seen a gynecologist in approximately two years and is considering referral  Constitutional and systemic symptoms - No chest pain, shortness of breath, nausea, vomiting, diarrhea, blood in stool, or dysuria    Review of Systems  Other than above, a 14 point review of systems is negative     Past Medical History:  Diagnosis Date   Blood transfusion without reported diagnosis    Breast mass    Cancer (HCC)    Carpal tunnel syndrome    Family history of prostate cancer    Fracture of carpal bone 2018   bilateral   GERD (gastroesophageal reflux disease)    Hemorrhoids    Hiatal hernia 02/2019   seen on EGD    History of left breast biopsy    Benign   HTN (hypertension)    Hyperlipidemia    Kidney stones    Osteoporosis    Osteoporosis     Vitamin D  deficiency     Past Surgical History:  Procedure Laterality Date   BIOPSY THYROID      BREAST BIOPSY Left 2010   x2, 2010-2012 (no cancer)   BREAST BIOPSY Right 01/10/2023   US  RT BREAST BX W LOC DEV 1ST LESION IMG BX SPEC US  GUIDE 01/10/2023 [facility]   BREAST BIOPSY Right 01/10/2023   MM RT BREAST BX W LOC DEV 1ST LESION IMAGE BX SPEC STEREO GUIDE 01/10/2023 [facility]   BREAST BIOPSY Right 01/22/2023   MM RT BREAST BX W LOC DEV 1ST LESION IMAGE BX SPEC STEREO GUIDE 01/22/2023 [facility]   BREAST BIOPSY Right 01/22/2023   MM RT BREAST BX W LOC DEV EA AD LESION IMG BX SPEC STEREO GUIDE 01/22/2023 [facility]   BREAST BIOPSY Left 01/22/2023   MM LT BREAST BX W LOC DEV 1ST LESION IMAGE BX SPEC STEREO GUIDE 01/22/2023 [facility]   BREAST EXCISIONAL BIOPSY Left    CHOLECYSTECTOMY  2015   HEMORRHOIDECTOMY WITH HEMORRHOID BANDING  04/22/2017   SIMPLE MASTECTOMY WITH AXILLARY SENTINEL NODE BIOPSY Bilateral 03/05/2023   Procedure: BILATERAL MASTECTOMIES WITH BILATERAL MAGTRACE INJECTIONS;  Surgeon: Aron Shoulders, MD;  Location: [facility];  Service: General;  Laterality: Bilateral;  PEC BLOCK    Current Outpatient Medications  Medication Instructions   gabapentin  (NEURONTIN ) 100 MG capsule Take 1 capsule (100 mg total) by mouth 2 (two) times daily as  needed for burning pain.   hydrochlorothiazide  (HYDRODIURIL ) 25 mg, Oral, Daily   losartan  (COZAAR ) 100 mg, Oral, Daily   metoprolol  succinate (TOPROL -XL) 50 mg, Oral, Daily, Take with or immediately following a meal.   Multiple Vitamins-Minerals (CENTRUM ADULTS PO) Take by mouth.   pantoprazole  (PROTONIX ) 40 mg, Oral, Daily   VITAMIN D , CHOLECALCIFEROL , PO Take by mouth.       Objective:   Physical Exam BP 126/82   Pulse 63   Temp 97.6 F (36.4 C) (Oral)   Resp 16   Ht 4' 11 (1.499 m)   Wt 170 lb 4 oz (77.2 kg)   SpO2 96%   BMI 34.39 kg/m  General: Well developed,  NAD, BMI noted Neck: No  thyromegaly  HEENT:  Normocephalic . Face symmetric, atraumatic Lungs:  CTA B Normal respiratory effort, no intercostal retractions, no accessory muscle use. Heart: RRR,  no murmur.  Abdomen:  Not distended, soft, non-tender. No rebound or rigidity.   Lower extremities: +/+++  pretibial edema bilaterally  Skin: Exposed areas without rash. Not pale. Not jaundice Neurologic:  alert & oriented X3.  Speech normal, gait appropriate for age and unassisted Strength symmetric and appropriate for age.  Psych: Cognition and judgment appear intact.  Cooperative with normal attention span and concentration.  Behavior appropriate. No anxious or depressed appearing.     Assessment   ASSESSMENT. new patient 08/2020 HTN  amlodipine : Intolerant palpation, headache, edema.   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   -US  liver 2022: Likely steatosis - Hep A&B serology neg 2022 Breast cancer: Bilateral mastectomy 03/05/2023.    Assessment & Plan Here for CPX Tdap: 2022 PNM 20 today  REC: flu-covid- shingrix >>> declines for now CCS: Had a colonoscopy 02-2019, cscope 06/2023- next per GI  H/o breast ca Last PAP 11/06/2020, refer to gyn Labs:  CMP FLP entheses in 2 weeks)   Other issues: HTN: Back in August, BP was not well-controlled, losartan  increased to 100 mg, she was recommended to continue with metoprolol .  Subsequent BMP was okay.  Later on as BP was not controlled, amlodipine  10 mg added but caused edema, dose decreased to 5 mg and slightly edema better and patient would like edema resolved.  Plan: - Discontinued amlodipine . - Start HCTZ, other meds  the same. - Ordered CMP in two weeks. - Continue to monitor blood pressure. Gastroesophageal reflux disease with dysphagia GERD with dysphagia,regurgitation as described above.  Not taking PPIs at present.  Plan: - Restart pantoprazole  40 mg daily on an empty stomach. - Referred to GI - Advised dietary modifications: avoid large meals, eating late, excessive coffee and chocolate. Osteoporosis Fosamax  recommended but deferred until GERD and dysphagia are addressed.  Vitamin D  deficiency - Continue vitamin D  supplementation at 2000 units daily. RTC 2 weeks for nurse visit.  RTC follow-up 4 months          "

## 2024-04-13 NOTE — Assessment & Plan Note (Signed)
 Here for CPX Other issues: HTN: Back in August, BP was not well-controlled, losartan  increased to 100 mg, she was recommended to continue with metoprolol .  Subsequent BMP was okay.  Later on as BP was not controlled, amlodipine  10 mg added but caused edema, dose decreased to 5 mg and slightly edema better and patient would like edema resolved.  Plan: - Discontinued amlodipine . - Start HCTZ, other meds the same. - Ordered CMP in two weeks. - Continue to monitor blood pressure. Gastroesophageal reflux disease with dysphagia GERD with dysphagia,regurgitation as described above.  Not taking PPIs at present.  Plan: - Restart pantoprazole  40 mg daily on an empty stomach. - Referred to GI - Advised dietary modifications: avoid large meals, eating late, excessive coffee and chocolate. Osteoporosis Fosamax  recommended but deferred until GERD and dysphagia are addressed.  Vitamin D  deficiency - Continue vitamin D  supplementation at 2000 units daily. RTC 2 weeks for nurse visit.  RTC follow-up 4 months

## 2024-04-13 NOTE — Patient Instructions (Signed)
 GO TO THE LAB :  Get the blood work    Then, go to the front desk for the checkout Please make an appointment for a nurse visit in 2 weeks for a blood pressure check and blood work Make an appointment to see me in 4 months  Stop amlodipine  Start hydrochlorothiazide  25 mg: 1 tablet in the morning Other blood pressure medications the same  Check the  blood pressure regularly Blood pressure goal:  between 110/65 and  135/85. If it is consistently higher or lower, let me know  Start pantoprazole  40 mg 1 tablet before test  Referrals: Gynecology, gastroenterology    HYPERTENSION WITH MEDICATION-INDUCED EDEMA: Your blood pressure is well-controlled, but the swelling is likely due to your current medication, amlodipine . -We have stopped amlodipine  and started you on hydrochlorothiazide . -Please get a comprehensive metabolic panel (CMP) in two weeks. -Continue to monitor your blood pressure at home.  GASTROESOPHAGEAL REFLUX DISEASE WITH DYSPHAGIA: You have acid reflux with difficulty swallowing and a history of hiatal hernia. -Restart taking pantoprazole  40 mg daily on an empty stomach. -You have been referred to a gastroenterologist for a possible endoscopy. -Avoid large meals, eating late, and excessive coffee and chocolate.  OSTEOPOROSIS: You have osteoporosis and are experiencing intermittent leg pain. -We will start you on Fosamax  after your GERD and dysphagia are managed.  VITAMIN D  DEFICIENCY: You have a vitamin D  deficiency. -Continue taking vitamin D  supplements at 2000 units daily.  GENERAL HEALTH MAINTENANCE: Routine health maintenance was discussed. -You declined the COVID vaccine but agreed to the pneumonia vaccine. -You have been referred to a gynecologist for routine care.

## 2024-04-26 ENCOUNTER — Ambulatory Visit (INDEPENDENT_AMBULATORY_CARE_PROVIDER_SITE_OTHER): Payer: Medicare (Managed Care) | Admitting: *Deleted

## 2024-04-26 ENCOUNTER — Other Ambulatory Visit (INDEPENDENT_AMBULATORY_CARE_PROVIDER_SITE_OTHER): Payer: Medicare (Managed Care)

## 2024-04-26 DIAGNOSIS — E785 Hyperlipidemia, unspecified: Secondary | ICD-10-CM | POA: Diagnosis not present

## 2024-04-26 DIAGNOSIS — I10 Essential (primary) hypertension: Secondary | ICD-10-CM | POA: Diagnosis not present

## 2024-04-26 LAB — LIPID PANEL
Cholesterol: 214 mg/dL — ABNORMAL HIGH (ref 28–200)
HDL: 46.8 mg/dL
LDL Cholesterol: 112 mg/dL — ABNORMAL HIGH (ref 10–99)
NonHDL: 167.44
Total CHOL/HDL Ratio: 5
Triglycerides: 279 mg/dL — ABNORMAL HIGH (ref 10.0–149.0)
VLDL: 55.8 mg/dL — ABNORMAL HIGH (ref 0.0–40.0)

## 2024-04-26 LAB — COMPREHENSIVE METABOLIC PANEL WITH GFR
ALT: 64 U/L — ABNORMAL HIGH (ref 3–35)
AST: 41 U/L — ABNORMAL HIGH (ref 5–37)
Albumin: 4.1 g/dL (ref 3.5–5.2)
Alkaline Phosphatase: 126 U/L — ABNORMAL HIGH (ref 39–117)
BUN: 21 mg/dL (ref 6–23)
CO2: 31 meq/L (ref 19–32)
Calcium: 9.2 mg/dL (ref 8.4–10.5)
Chloride: 101 meq/L (ref 96–112)
Creatinine, Ser: 0.64 mg/dL (ref 0.40–1.20)
GFR: 92 mL/min
Glucose, Bld: 106 mg/dL — ABNORMAL HIGH (ref 70–99)
Potassium: 4 meq/L (ref 3.5–5.1)
Sodium: 137 meq/L (ref 135–145)
Total Bilirubin: 0.3 mg/dL (ref 0.2–1.2)
Total Protein: 7.6 g/dL (ref 6.0–8.3)

## 2024-04-26 NOTE — Progress Notes (Signed)
 Pt here for BP check.  Pt is taking hydrochlorothiazide  25mg , Losartan  100mg , Metoprolol  50mg .    Today's BP is 176/64 pulse 66  Recheck was 137/73 pulse 64  Per Dr. Amon, continue with current meds, continue checking BPs at home, and f/u with him in 4 months.

## 2024-04-28 ENCOUNTER — Encounter: Payer: Self-pay | Admitting: Internal Medicine

## 2024-04-28 ENCOUNTER — Ambulatory Visit: Payer: Self-pay | Admitting: Internal Medicine

## 2024-05-11 NOTE — Telephone Encounter (Signed)
 Patient has called to get information on referrals. I did relay message and provided her with both numbers to call and make an appointment.

## 2024-05-24 ENCOUNTER — Other Ambulatory Visit: Payer: Self-pay | Admitting: Internal Medicine

## 2024-05-24 ENCOUNTER — Other Ambulatory Visit (HOSPITAL_BASED_OUTPATIENT_CLINIC_OR_DEPARTMENT_OTHER): Payer: Self-pay

## 2024-05-24 MED ORDER — METOPROLOL SUCCINATE ER 50 MG PO TB24
50.0000 mg | ORAL_TABLET | Freq: Every day | ORAL | 0 refills | Status: AC
  Filled 2024-05-24: qty 90, 90d supply, fill #0

## 2024-05-25 ENCOUNTER — Other Ambulatory Visit (HOSPITAL_BASED_OUTPATIENT_CLINIC_OR_DEPARTMENT_OTHER): Payer: Self-pay

## 2024-06-14 ENCOUNTER — Ambulatory Visit: Payer: Medicare (Managed Care) | Attending: General Surgery

## 2024-08-09 ENCOUNTER — Ambulatory Visit: Payer: Medicare (Managed Care) | Admitting: Internal Medicine

## 2024-08-13 ENCOUNTER — Ambulatory Visit: Payer: Medicare (Managed Care)

## 2025-04-08 ENCOUNTER — Inpatient Hospital Stay: Payer: Medicare (Managed Care) | Admitting: Hematology and Oncology
